# Patient Record
Sex: Female | Born: 1965
Health system: Southern US, Community
[De-identification: ages and names within clinical notes are randomized; demographics above are authoritative.]

## PROBLEM LIST (undated history)

## (undated) DIAGNOSIS — Z9889 Other specified postprocedural states: Secondary | ICD-10-CM

## (undated) DIAGNOSIS — E669 Obesity, unspecified: Secondary | ICD-10-CM

## (undated) DIAGNOSIS — E039 Hypothyroidism, unspecified: Secondary | ICD-10-CM

## (undated) DIAGNOSIS — K529 Noninfective gastroenteritis and colitis, unspecified: Secondary | ICD-10-CM

## (undated) DIAGNOSIS — G629 Polyneuropathy, unspecified: Secondary | ICD-10-CM

## (undated) DIAGNOSIS — E785 Hyperlipidemia, unspecified: Secondary | ICD-10-CM

## (undated) DIAGNOSIS — I5189 Other ill-defined heart diseases: Secondary | ICD-10-CM

## (undated) DIAGNOSIS — Z9109 Other allergy status, other than to drugs and biological substances: Secondary | ICD-10-CM

## (undated) DIAGNOSIS — K5900 Constipation, unspecified: Secondary | ICD-10-CM

## (undated) DIAGNOSIS — D649 Anemia, unspecified: Secondary | ICD-10-CM

## (undated) DIAGNOSIS — J45909 Unspecified asthma, uncomplicated: Secondary | ICD-10-CM

## (undated) DIAGNOSIS — R350 Frequency of micturition: Secondary | ICD-10-CM

## (undated) DIAGNOSIS — K219 Gastro-esophageal reflux disease without esophagitis: Secondary | ICD-10-CM

## (undated) DIAGNOSIS — K649 Unspecified hemorrhoids: Secondary | ICD-10-CM

## (undated) DIAGNOSIS — R3915 Urgency of urination: Secondary | ICD-10-CM

## (undated) DIAGNOSIS — R112 Nausea with vomiting, unspecified: Secondary | ICD-10-CM

## (undated) DIAGNOSIS — R0600 Dyspnea, unspecified: Secondary | ICD-10-CM

## (undated) DIAGNOSIS — H409 Unspecified glaucoma: Secondary | ICD-10-CM

## (undated) DIAGNOSIS — K5792 Diverticulitis of intestine, part unspecified, without perforation or abscess without bleeding: Secondary | ICD-10-CM

## (undated) DIAGNOSIS — M1990 Unspecified osteoarthritis, unspecified site: Secondary | ICD-10-CM

## (undated) DIAGNOSIS — G4733 Obstructive sleep apnea (adult) (pediatric): Secondary | ICD-10-CM

## (undated) HISTORY — PX: COLONOSCOPY: SHX174

## (undated) HISTORY — PX: REDUCTION MAMMAPLASTY: SUR839

## (undated) HISTORY — DX: Diverticulitis of intestine, part unspecified, without perforation or abscess without bleeding: K57.92

## (undated) HISTORY — PX: OTHER SURGICAL HISTORY: SHX169

## (undated) HISTORY — DX: Obesity, unspecified: E66.9

## (undated) HISTORY — DX: Obstructive sleep apnea (adult) (pediatric): G47.33

## (undated) HISTORY — PX: CHOLECYSTECTOMY: SHX55

## (undated) HISTORY — DX: Noninfective gastroenteritis and colitis, unspecified: K52.9

## (undated) HISTORY — DX: Dyspnea, unspecified: R06.00

## (undated) HISTORY — PX: CARPAL TUNNEL RELEASE: SHX101

## (undated) HISTORY — DX: Other ill-defined heart diseases: I51.89

## (undated) HISTORY — DX: Hyperlipidemia, unspecified: E78.5

## (undated) HISTORY — DX: Polyneuropathy, unspecified: G62.9

## (undated) HISTORY — PX: BREAST REDUCTION SURGERY: SHX8

---

## 1990-02-12 HISTORY — PX: TUBAL LIGATION: SHX77

## 2005-02-12 HISTORY — PX: OTHER SURGICAL HISTORY: SHX169

## 2006-02-12 HISTORY — PX: CHOLECYSTECTOMY: SHX55

## 2006-12-07 ENCOUNTER — Emergency Department (HOSPITAL_COMMUNITY): Admission: EM | Admit: 2006-12-07 | Discharge: 2006-12-07 | Payer: Self-pay | Admitting: Emergency Medicine

## 2006-12-10 ENCOUNTER — Ambulatory Visit: Payer: Self-pay | Admitting: Internal Medicine

## 2006-12-10 LAB — CONVERTED CEMR LAB
BUN: 11 mg/dL (ref 6–23)
CO2: 24 meq/L (ref 19–32)
Cholesterol: 203 mg/dL — ABNORMAL HIGH (ref 0–200)
Creatinine, Ser: 0.82 mg/dL (ref 0.40–1.20)
Eosinophils Relative: 2 % (ref 0–5)
Glucose, Bld: 89 mg/dL (ref 70–99)
HCT: 36.6 % (ref 36.0–46.0)
Hemoglobin: 11.8 g/dL — ABNORMAL LOW (ref 12.0–15.0)
Lymphocytes Relative: 34 % (ref 12–46)
Lymphs Abs: 2.4 10*3/uL (ref 0.7–3.3)
Monocytes Absolute: 0.4 10*3/uL (ref 0.2–0.7)
RDW: 14.7 % — ABNORMAL HIGH (ref 11.5–14.0)
Total Bilirubin: 0.3 mg/dL (ref 0.3–1.2)
Total CHOL/HDL Ratio: 3.5
Triglycerides: 144 mg/dL (ref <150)
VLDL: 29 mg/dL (ref 0–40)

## 2007-01-01 ENCOUNTER — Ambulatory Visit (HOSPITAL_COMMUNITY): Admission: RE | Admit: 2007-01-01 | Discharge: 2007-01-01 | Payer: Self-pay | Admitting: Family Medicine

## 2007-01-27 ENCOUNTER — Ambulatory Visit: Payer: Self-pay | Admitting: *Deleted

## 2007-01-27 ENCOUNTER — Encounter (INDEPENDENT_AMBULATORY_CARE_PROVIDER_SITE_OTHER): Payer: Self-pay | Admitting: Internal Medicine

## 2007-01-27 ENCOUNTER — Encounter: Payer: Self-pay | Admitting: Family Medicine

## 2007-01-27 ENCOUNTER — Ambulatory Visit: Payer: Self-pay | Admitting: Family Medicine

## 2007-03-26 ENCOUNTER — Emergency Department (HOSPITAL_COMMUNITY): Admission: EM | Admit: 2007-03-26 | Discharge: 2007-03-26 | Payer: Self-pay | Admitting: Emergency Medicine

## 2007-03-31 ENCOUNTER — Ambulatory Visit: Payer: Self-pay | Admitting: Family Medicine

## 2007-04-09 ENCOUNTER — Ambulatory Visit: Payer: Self-pay | Admitting: Internal Medicine

## 2007-08-25 ENCOUNTER — Ambulatory Visit: Payer: Self-pay | Admitting: Internal Medicine

## 2007-08-27 ENCOUNTER — Ambulatory Visit: Payer: Self-pay | Admitting: Internal Medicine

## 2008-02-10 ENCOUNTER — Emergency Department (HOSPITAL_COMMUNITY): Admission: EM | Admit: 2008-02-10 | Discharge: 2008-02-10 | Payer: Self-pay | Admitting: Emergency Medicine

## 2008-04-05 ENCOUNTER — Emergency Department (HOSPITAL_COMMUNITY): Admission: EM | Admit: 2008-04-05 | Discharge: 2008-04-05 | Payer: Self-pay | Admitting: Internal Medicine

## 2008-04-09 ENCOUNTER — Ambulatory Visit: Payer: Self-pay | Admitting: Internal Medicine

## 2008-04-19 ENCOUNTER — Encounter (INDEPENDENT_AMBULATORY_CARE_PROVIDER_SITE_OTHER): Payer: Self-pay | Admitting: Adult Health

## 2008-04-19 ENCOUNTER — Ambulatory Visit: Payer: Self-pay | Admitting: Internal Medicine

## 2008-04-19 ENCOUNTER — Ambulatory Visit (HOSPITAL_COMMUNITY): Admission: RE | Admit: 2008-04-19 | Discharge: 2008-04-19 | Payer: Self-pay | Admitting: Internal Medicine

## 2008-04-19 LAB — CONVERTED CEMR LAB
Albumin: 3.8 g/dL (ref 3.5–5.2)
Alkaline Phosphatase: 56 units/L (ref 39–117)
BUN: 16 mg/dL (ref 6–23)
Basophils Absolute: 0 10*3/uL (ref 0.0–0.1)
Basophils Relative: 0 % (ref 0–1)
CO2: 21 meq/L (ref 19–32)
Cholesterol: 177 mg/dL (ref 0–200)
Eosinophils Relative: 4 % (ref 0–5)
Glucose, Bld: 91 mg/dL (ref 70–99)
HCT: 35.2 % — ABNORMAL LOW (ref 36.0–46.0)
HDL: 61 mg/dL (ref >39)
LDL Cholesterol: 96 mg/dL (ref 0–99)
Lymphocytes Relative: 30 % (ref 12–46)
MCV: 64.9 fL — ABNORMAL LOW (ref 78.0–100.0)
Monocytes Absolute: 0.8 10*3/uL (ref 0.1–1.0)
Monocytes Relative: 8 % (ref 3–12)
Platelets: 313 10*3/uL (ref 150–400)
Potassium: 4.1 meq/L (ref 3.5–5.3)
RDW: 16 % — ABNORMAL HIGH (ref 11.5–15.5)
Triglycerides: 102 mg/dL (ref <150)

## 2008-04-20 ENCOUNTER — Encounter (INDEPENDENT_AMBULATORY_CARE_PROVIDER_SITE_OTHER): Payer: Self-pay | Admitting: Adult Health

## 2008-04-29 ENCOUNTER — Ambulatory Visit (HOSPITAL_COMMUNITY): Admission: RE | Admit: 2008-04-29 | Discharge: 2008-04-29 | Payer: Self-pay | Admitting: Family Medicine

## 2008-05-03 ENCOUNTER — Ambulatory Visit: Payer: Self-pay | Admitting: Internal Medicine

## 2008-05-03 ENCOUNTER — Encounter: Payer: Self-pay | Admitting: Internal Medicine

## 2008-05-05 ENCOUNTER — Ambulatory Visit (HOSPITAL_COMMUNITY): Admission: RE | Admit: 2008-05-05 | Discharge: 2008-05-05 | Payer: Self-pay | Admitting: Internal Medicine

## 2008-05-05 ENCOUNTER — Encounter: Payer: Self-pay | Admitting: Internal Medicine

## 2008-05-11 ENCOUNTER — Encounter: Payer: Self-pay | Admitting: Internal Medicine

## 2008-05-11 ENCOUNTER — Encounter (INDEPENDENT_AMBULATORY_CARE_PROVIDER_SITE_OTHER): Payer: Self-pay | Admitting: Adult Health

## 2008-05-11 ENCOUNTER — Ambulatory Visit: Payer: Self-pay | Admitting: Family Medicine

## 2008-05-11 LAB — CONVERTED CEMR LAB
Basophils Relative: 0 % (ref 0–1)
CO2: 22 meq/L (ref 19–32)
Calcium: 9.5 mg/dL (ref 8.4–10.5)
Chloride: 103 meq/L (ref 96–112)
Creatinine, Ser: 1.28 mg/dL — ABNORMAL HIGH (ref 0.40–1.20)
Eosinophils Absolute: 0.2 10*3/uL (ref 0.0–0.7)
Eosinophils Relative: 2 % (ref 0–5)
Glucose, Bld: 107 mg/dL — ABNORMAL HIGH (ref 70–99)
HCT: 37.5 % (ref 36.0–46.0)
Hemoglobin: 11.8 g/dL — ABNORMAL LOW (ref 12.0–15.0)
Lymphs Abs: 3.1 10*3/uL (ref 0.7–4.0)
MCHC: 31.5 g/dL (ref 30.0–36.0)
MCV: 67.7 fL — ABNORMAL LOW (ref 78.0–100.0)
Monocytes Absolute: 0.3 10*3/uL (ref 0.1–1.0)
Monocytes Relative: 4 % (ref 3–12)
Neutrophils Relative %: 59 % (ref 43–77)
RBC: 5.54 M/uL — ABNORMAL HIGH (ref 3.87–5.11)
Total Bilirubin: 0.3 mg/dL (ref 0.3–1.2)

## 2008-05-18 ENCOUNTER — Ambulatory Visit: Payer: Self-pay | Admitting: Internal Medicine

## 2008-06-02 DIAGNOSIS — E785 Hyperlipidemia, unspecified: Secondary | ICD-10-CM

## 2008-06-02 DIAGNOSIS — J4540 Moderate persistent asthma, uncomplicated: Secondary | ICD-10-CM

## 2008-06-03 ENCOUNTER — Ambulatory Visit: Payer: Self-pay | Admitting: Internal Medicine

## 2008-06-03 DIAGNOSIS — R222 Localized swelling, mass and lump, trunk: Secondary | ICD-10-CM

## 2008-06-09 ENCOUNTER — Telehealth: Payer: Self-pay | Admitting: Internal Medicine

## 2008-06-11 ENCOUNTER — Telehealth (INDEPENDENT_AMBULATORY_CARE_PROVIDER_SITE_OTHER): Payer: Self-pay | Admitting: *Deleted

## 2008-06-15 ENCOUNTER — Encounter: Payer: Self-pay | Admitting: Internal Medicine

## 2008-06-15 ENCOUNTER — Encounter: Payer: Self-pay | Admitting: Cardiology

## 2008-06-22 ENCOUNTER — Ambulatory Visit: Payer: Self-pay | Admitting: Cardiology

## 2008-06-25 ENCOUNTER — Encounter: Payer: Self-pay | Admitting: Internal Medicine

## 2008-06-25 ENCOUNTER — Ambulatory Visit (HOSPITAL_COMMUNITY): Admission: RE | Admit: 2008-06-25 | Discharge: 2008-06-25 | Payer: Self-pay | Admitting: Internal Medicine

## 2008-07-01 ENCOUNTER — Ambulatory Visit: Payer: Self-pay | Admitting: Internal Medicine

## 2008-08-24 ENCOUNTER — Ambulatory Visit: Payer: Self-pay | Admitting: Internal Medicine

## 2008-08-24 DIAGNOSIS — K219 Gastro-esophageal reflux disease without esophagitis: Secondary | ICD-10-CM

## 2008-08-29 ENCOUNTER — Ambulatory Visit (HOSPITAL_BASED_OUTPATIENT_CLINIC_OR_DEPARTMENT_OTHER): Admission: RE | Admit: 2008-08-29 | Discharge: 2008-08-29 | Payer: Self-pay | Admitting: Internal Medicine

## 2008-08-29 ENCOUNTER — Ambulatory Visit: Payer: Self-pay | Admitting: Pulmonary Disease

## 2008-10-14 ENCOUNTER — Ambulatory Visit: Payer: Self-pay | Admitting: Pulmonary Disease

## 2008-10-14 DIAGNOSIS — G4733 Obstructive sleep apnea (adult) (pediatric): Secondary | ICD-10-CM | POA: Insufficient documentation

## 2008-10-14 DIAGNOSIS — R609 Edema, unspecified: Secondary | ICD-10-CM

## 2008-10-21 ENCOUNTER — Encounter: Payer: Self-pay | Admitting: Pulmonary Disease

## 2008-10-28 ENCOUNTER — Ambulatory Visit: Payer: Self-pay | Admitting: Internal Medicine

## 2008-10-28 ENCOUNTER — Encounter: Payer: Self-pay | Admitting: Pulmonary Disease

## 2008-11-02 ENCOUNTER — Ambulatory Visit: Payer: Self-pay | Admitting: Family Medicine

## 2008-11-02 ENCOUNTER — Encounter (INDEPENDENT_AMBULATORY_CARE_PROVIDER_SITE_OTHER): Payer: Self-pay | Admitting: Adult Health

## 2008-11-02 LAB — CONVERTED CEMR LAB
ALT: 14 units/L (ref 0–35)
AST: 14 units/L (ref 0–37)
Albumin: 3.8 g/dL (ref 3.5–5.2)
Alkaline Phosphatase: 61 units/L (ref 39–117)
BUN: 10 mg/dL (ref 6–23)
CO2: 25 meq/L (ref 19–32)
Calcium: 8.8 mg/dL (ref 8.4–10.5)
Chlamydia, DNA Probe: NEGATIVE
Chloride: 104 meq/L (ref 96–112)
Creatinine, Ser: 0.91 mg/dL (ref 0.40–1.20)
GC Probe Amp, Genital: NEGATIVE
Glucose, Bld: 104 mg/dL — ABNORMAL HIGH (ref 70–99)
Potassium: 4.3 meq/L (ref 3.5–5.3)
Pro B Natriuretic peptide (BNP): 8.5 pg/mL (ref 0.0–100.0)
Sodium: 139 meq/L (ref 135–145)
TSH: 8.877 microintl units/mL — ABNORMAL HIGH (ref 0.350–4.500)
Total Bilirubin: 0.2 mg/dL — ABNORMAL LOW (ref 0.3–1.2)
Total Protein: 6.6 g/dL (ref 6.0–8.3)
Vit D, 25-Hydroxy: 11 ng/mL — ABNORMAL LOW (ref 30–89)

## 2008-11-03 ENCOUNTER — Encounter: Payer: Self-pay | Admitting: Pulmonary Disease

## 2008-11-30 ENCOUNTER — Ambulatory Visit: Payer: Self-pay | Admitting: Pulmonary Disease

## 2008-12-15 ENCOUNTER — Encounter (HOSPITAL_COMMUNITY): Admission: RE | Admit: 2008-12-15 | Discharge: 2009-02-11 | Payer: Self-pay | Admitting: Internal Medicine

## 2008-12-23 ENCOUNTER — Encounter (INDEPENDENT_AMBULATORY_CARE_PROVIDER_SITE_OTHER): Payer: Self-pay | Admitting: Adult Health

## 2008-12-23 ENCOUNTER — Ambulatory Visit: Payer: Self-pay | Admitting: Internal Medicine

## 2008-12-23 LAB — CONVERTED CEMR LAB
ALT: 16 units/L (ref 0–35)
Albumin: 4.3 g/dL (ref 3.5–5.2)
CO2: 22 meq/L (ref 19–32)
Calcium: 9.2 mg/dL (ref 8.4–10.5)
Chloride: 103 meq/L (ref 96–112)
Glucose, Bld: 76 mg/dL (ref 70–99)
Hemoglobin: 11.4 g/dL — ABNORMAL LOW (ref 12.0–15.0)
Lymphs Abs: 2.7 10*3/uL (ref 0.7–4.0)
MCHC: 31 g/dL (ref 30.0–36.0)
Monocytes Absolute: 0.3 10*3/uL (ref 0.1–1.0)
Monocytes Relative: 4 % (ref 3–12)
Neutro Abs: 4.2 10*3/uL (ref 1.7–7.7)
Neutrophils Relative %: 56 % (ref 43–77)
Potassium: 3.6 meq/L (ref 3.5–5.3)
RBC: 5.49 M/uL — ABNORMAL HIGH (ref 3.87–5.11)
Sodium: 140 meq/L (ref 135–145)
T3 Uptake Ratio: 27.9 % (ref 22.5–37.0)
T4, Total: 7.4 ug/dL (ref 5.0–12.5)
Total Protein: 7.5 g/dL (ref 6.0–8.3)
WBC: 7.5 10*3/uL (ref 4.0–10.5)

## 2008-12-30 ENCOUNTER — Ambulatory Visit (HOSPITAL_COMMUNITY): Admission: RE | Admit: 2008-12-30 | Discharge: 2008-12-30 | Payer: Self-pay | Admitting: Internal Medicine

## 2008-12-30 ENCOUNTER — Encounter: Payer: Self-pay | Admitting: Internal Medicine

## 2009-01-11 ENCOUNTER — Ambulatory Visit: Payer: Self-pay | Admitting: Internal Medicine

## 2009-01-11 ENCOUNTER — Encounter (INDEPENDENT_AMBULATORY_CARE_PROVIDER_SITE_OTHER): Payer: Self-pay | Admitting: Adult Health

## 2009-01-11 LAB — CONVERTED CEMR LAB: TSH: 6.759 microintl units/mL — ABNORMAL HIGH (ref 0.350–4.500)

## 2009-02-12 ENCOUNTER — Encounter (HOSPITAL_COMMUNITY): Admission: RE | Admit: 2009-02-12 | Discharge: 2009-05-13 | Payer: Self-pay | Admitting: Internal Medicine

## 2009-09-22 ENCOUNTER — Ambulatory Visit: Payer: Self-pay | Admitting: Internal Medicine

## 2009-09-22 LAB — CONVERTED CEMR LAB
HDL: 62 mg/dL (ref >39)
Total CHOL/HDL Ratio: 3.4
Uric Acid, Serum: 7.2 mg/dL — ABNORMAL HIGH (ref 2.4–7.0)
VLDL: 31 mg/dL (ref 0–40)

## 2009-10-11 ENCOUNTER — Ambulatory Visit: Payer: Self-pay | Admitting: Internal Medicine

## 2009-10-26 ENCOUNTER — Telehealth: Payer: Self-pay | Admitting: Internal Medicine

## 2009-11-08 ENCOUNTER — Ambulatory Visit: Payer: Self-pay | Admitting: Internal Medicine

## 2009-11-08 ENCOUNTER — Encounter: Payer: Self-pay | Admitting: Internal Medicine

## 2009-11-24 ENCOUNTER — Ambulatory Visit: Payer: Self-pay | Admitting: Pulmonary Disease

## 2009-11-25 ENCOUNTER — Encounter: Payer: Self-pay | Admitting: Pulmonary Disease

## 2009-12-27 ENCOUNTER — Ambulatory Visit (HOSPITAL_COMMUNITY): Admission: RE | Admit: 2009-12-27 | Discharge: 2009-12-27 | Payer: Self-pay | Admitting: Chiropractor

## 2010-03-04 ENCOUNTER — Emergency Department (HOSPITAL_COMMUNITY)
Admission: EM | Admit: 2010-03-04 | Discharge: 2010-03-04 | Payer: Self-pay | Source: Home / Self Care | Admitting: Emergency Medicine

## 2010-03-05 ENCOUNTER — Encounter: Payer: Self-pay | Admitting: Internal Medicine

## 2010-03-16 NOTE — Assessment & Plan Note (Signed)
Summary: asthma/ mbw   Visit Type:  Follow-up Copy to:  Kalman Shan Primary Provider/Referring Provider:  Dr Laqueta Due PMD @ [facility], Pulmonary - Ramaswamy, Sleep - Sood  CC:  Pt here for follow-up. Pt has been out of symbicort since feb. [name]  History of Present Illness: OV 10/11/2009: morbidly obese asthma patient. NOt seen in 1 year. Supposed to be on symbicort. Ran out due to $ issues and health serve delays. Lot of social issues with family being ill as well. Not taken symicort since feb 2011.Snce then daily wheeze, cough, dyspnea. USing rescue inhaler several times per day. No acute or recent worsening. Denies fever, sputum, chills,edema.   Preventive Screening-Counseling & Management  Alcohol-Tobacco     Smoking Status: quit     Packs/Day: 2.0     Year Started: 1985     Year Quit: 1992     Pack years: 14  Current Medications (verified): 1)  Symbicort 80-4.5 Mcg/act Aero (Budesonide-Formoterol Fumarate) .... 2 Puffs Two Times A Day 2)  Chlorpheniramine Maleate 4 Mg Tabs (Chlorpheniramine Maleate) .... Take 1 Tablet By Mouth Once A Day 3)  Zegerid 40-1100 Mg Caps (Omeprazole-Sodium Bicarbonate) .[name].. 1 By Mouth Daily 4)  Vitamin D 1000 Unit Tabs (Cholecalciferol) .... 5000 International Units Weekly 5)  Calcium 600 Mg Tabs (Calcium) .[name].. 1200 Mg Daily 6)  Ventolin Hfa 108 (90 Base) Mcg/act Aers (Albuterol Sulfate) .[name].. 1-2 Puffs Every 8 Hours As Needed 7)  Pycnogenol 30 Mg Caps (Nutritional Supplements) .... 2 Capsules Twice Daily With Meal 8)  Coenzyme Q10 100 Mg Caps (Coenzyme Q10) .... One Capsule Twice Daily With Food 9)  Miralax  Powd (Polyethylene Glycol 3350) .... Once Daily  Allergies (verified): No Known Drug Allergies  Past History:  Past medical, surgical, family and social histories (including risk factors) reviewed, and no changes noted (except as noted below).  Past Medical History: Reviewed history from 11/30/2008 and no changes required. #Dyspnea  due to Obesity and Asthma -> 05/05/2008: Spirometry suggests restriction. CXR 04/19/2008  Normal. CT 02/10/2008  - Neg for PE. No infiltrates -> Methacholine Challenge Test: 06/25/2008: Positive PC20 between 1-4 #Gastroenteritis #Hyperlipidemia  >diet controlled #Diverticulitis #4cm atrial mass - seen on CT 02/10/2008 done in ER to rule out pulmonary emboli when presented for dyspnea -seen by Dr. Antoine Poche on 05/2008 and cleared #OSA on CPAP 12 cm H2O  Past Surgical History: Reviewed history from 06/22/2008 and no changes required. Gallbladder removal-2008 Polyps removal- 2007 Tubal Ligation- 1992  Family History: Reviewed history from 06/22/2008 and no changes required. Mother-asthma as a child, heart murmur, rheumatoid arthritis Brothers- asthma as a child  Social History: Reviewed history from 06/22/2008 and no changes required. 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  The patient denies shortness of breath at rest, productive cough, coughing up blood, chest pain, irregular heartbeats, acid heartburn, indigestion, loss of appetite, weight change, abdominal pain, difficulty swallowing, sore throat, tooth/dental problems, headaches, nasal congestion/difficulty breathing through nose, sneezing, itching, ear ache, anxiety, depression, hand/feet swelling, joint stiffness or pain, rash, change in color of mucus, and fever.         wheezing  Vital Signs:  Patient profile:   45 year old female Height:  68 inches Weight:      364.13 pounds BMI:     55.57 O2 Sat:      98 % on Room air Temp:     98.0 degrees F oral Pulse rate:   90 / minute BP sitting:   120 / 72  (right arm) Cuff size:   large  Vitals Entered By: Carron Curie CMA (October 11, 2009 4:31  PM)  O2 Flow:  Room air CC: Pt here for follow-up. Pt has been out of symbicort since feb.    Physical Exam  General:  obese.  hoarse voice after doing spirometry Head:  normocephalic and atraumatic Eyes:  PERRLA and EOMI.   Ears:  TMs intact and clear with normal canals Nose:  no deformity, discharge, inflammation, or lesions Mouth:  no deformity or lesions Neck:  no masses, thyromegaly, or abnormal cervical nodes Chest Wall:  no deformities noted Lungs:  decreased BS bilateral and prolonged exhilation.   some difficulty finishing sentences once she is walking  Heart:  regular rate and rhythm, S1, S2 without murmurs, rubs, gallops, or clicks Abdomen:  bowel sounds positive; abdomen soft and non-tender without masses, or organomegaly Msk:  no deformity or scoliosis noted with normal posture Pulses:  pulses normal Extremities:  1+ edema Neurologic:  CN II-XII grossly intact with normal reflexes, coordination, muscle strength and tone Skin:  intact without lesions or rashes Cervical Nodes:  no significant adenopathy Axillary Nodes:  no significant adenopathy Psych:  alert and cooperative; normal mood and affect; normal attention span and concentration   Pulmonary Function Test Date: 10/11/2009 4:43 PM Gender: Female  Pre-Spirometry FVC    Value: 2.48 L/min   % Pred: 71.40 % FEV1    Value: 2.01 L     Pred: 2.84 L     % Pred: 70.80 % FEV1/FVC  Value: 80.95 %     % Pred: 97.40 %  Evaluation: moderate obstruction with significant bronchodilator response  Impression & Recommendations:  Problem # 1:  ASTHMA (ICD-493.90) Assessment Deteriorated Deterioration due to non compliance due to financial issues. Fev1 70% today. I will start pulmicort and see her 4 weeks with spirometry. IF she gets worse, she will call or go to ER  Medications Added to Medication List This Visit: 1)  Ventolin Hfa 108 (90 Base) Mcg/act Aers (Albuterol sulfate) .[name].. 1-2 puffs every 8 hours as  needed 2)  Pycnogenol 30 Mg Caps (Nutritional supplements) .... 2 capsules twice daily with meal 3)  Coenzyme Q10 100 Mg Caps (Coenzyme q10) .... One capsule twice daily with food 4)  Miralax Powd (Polyethylene glycol 3350) .... Once daily  Other Orders: Est. Patient Level II ([address])  Patient Instructions: 1)  sorry your family is ill and there are financial dificulties 2)  take pro-air as needed 3)  take pulmicort 2 puff two times a day  4)  take 3 samples and learn technique 5)  return in 4 weeks to report progress 6)  get flu shot asap 7)  if worse call us or go to er     CardioPerfect Spirometry  ID: [id number] Patient: DASHLEY, MONTS DOB: Jan 02, 1966 Age: 45 Years Old Sex: Female Race: Black Physician: ramaswamy Height: 68 Weight: 364.13 PPD: 2.0 Status: Unconfirmed Past Medical History:  #Dyspnea due to Obesity and Asthma -> 05/05/2008: Spirometry suggests restriction. CXR 04/19/2008  Normal. CT 02/10/2008  - Neg for PE. No infiltrates -> Methacholine Challenge Test: 06/25/2008: Positive PC20 between 1-4 #Gastroenteritis #Hyperlipidemia  >diet controlled #  Diverticulitis #4cm atrial mass - seen on CT 02/10/2008 done in ER to rule out pulmonary emboli when presented for dyspnea -seen by Dr. Antoine Poche on 05/2008 and cleared #OSA on CPAP 12 cm H2O  Recorded: 10/11/2009 4:43 PM  Parameter  Measured Predicted %Predicted FVC     2.48        3.48        71.40 FEV1     2.01        2.84        70.80 FEV1%   80.95        83.09        97.40 PEF    5.09        7.16        71   Interpretation:

## 2010-03-16 NOTE — Assessment & Plan Note (Signed)
Summary: rov 4 wks ///kp   Visit Type:  Follow-up Copy to:  Kalman Shan Primary Provider/Referring Provider:  Dr Laqueta Due PMD @ [facility], Pulmonary - Rainen Vanrossum, Sleep - Sood  CC:  4 wek follow-up. [name]  History of Present Illness: Morbidly obese asthma patient.   November 08, 2009: Last seen 10/11/2009 after a year's abscence. AT that time she was in AE-asthma with fev1 70% due tomedication noncompliance secondary to social stress and $ issues. Started her on sample pulmicort. Now returns for followup. Meds reviewed. She is taking pulmicort regularly (unable to afford it). Feels better. Back to baseline. Albuterol use is only 1 since last visit. No new complaints.  No acute or recent worsening. Denies fever, sputum, chills,edema. Today she is c/o sleep issues - CPAP not working well  Preventive Screening-Counseling & Management  Alcohol-Tobacco     Smoking Status: quit     Packs/Day: 2.0     Year Started: 1985     Year Quit: 1992     Pack years: 14  Current Medications (verified): 1)  Pulmicort Flexhaler 180 Mcg/act Aepb (Budesonide) .... 2 Puffs Twice Daily 2)  Chlorpheniramine Maleate 4 Mg Tabs (Chlorpheniramine Maleate) .... Take 1 Tablet By Mouth Once A Day 3)  Zegerid 40-1100 Mg Caps (Omeprazole-Sodium Bicarbonate) .[name].. 1 By Mouth Daily 4)  Vitamin D 1000 Unit Tabs (Cholecalciferol) .... 5000 International Units Weekly 5)  Calcium 600 Mg Tabs (Calcium) .[name].. 1200 Mg Daily 6)  Ventolin Hfa 108 (90 Base) Mcg/act Aers (Albuterol Sulfate) .[name].. 1-2 Puffs Every 8 Hours As Needed 7)  Pycnogenol 30 Mg Caps (Nutritional Supplements) .... 2 Capsules Twice Daily With Meal 8)  Coenzyme Q10 100 Mg Caps (Coenzyme Q10) .... One Capsule Twice Daily With Food 9)  Miralax  Powd (Polyethylene Glycol 3350) .... Once Daily 10)  Aleve 220 Mg Tabs (Naproxen Sodium) .... As Needed 11)  Multivitamins  Tabs (Multiple Vitamin) .... Take 1 Tablet By Mouth Once A Day  Allergies (verified): No  Known Drug Allergies  Past History:  Past medical, surgical, family and social histories (including risk factors) reviewed, and no changes noted (except as noted below).  Past Medical History: #Dyspnea due to Obesity and Asthma -> 05/05/2008: Spirometry suggests restriction. CXR 04/19/2008  Normal. CT 02/10/2008  - Neg for PE. No infiltrates -> Methacholine Challenge Test: 06/25/2008: Positive PC20 between 1-4  - 10/11/2009: fev1 2L/70% -> start pulmicort  -11/08/2009: fev1 2.24L/78% and better - > switch to QVAR due to cost #Gastroenteritis #Hyperlipidemia  >diet controlled #Diverticulitis #4cm atrial mass - seen on CT 02/10/2008 done in ER to rule out pulmonary emboli when presented for dyspnea -seen by Dr. Antoine Poche on 05/2008 and cleared #OSA on CPAP 12 cm H2O  Past Surgical History: Reviewed history from 06/22/2008 and no changes required. Gallbladder removal-2008 Polyps removal- 2007 Tubal Ligation- 1992  Family History: Reviewed history from 06/22/2008 and no changes required. Mother-asthma as a child, heart murmur, rheumatoid arthritis Brothers- asthma as a child  Social History: Reviewed history from 06/22/2008 and no changes required. 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    Vital Signs:  Patient profile:   45 year old female Height:      68 inches Weight:      367.50 pounds BMI:      56.08 O2 Sat:      100 % on Room air Temp:     97.9 degrees F oral Pulse rate:   84 / minute BP sitting:   122 / 70  (left arm) Cuff size:   large  Vitals Entered By: Carron Curie CMA (November 08, 2009 9:10 AM)  O2 Flow:  Room air CC: 4 wek follow-up.  Comments Medications reviewed with patient Carron Curie CMA  November 08, 2009 9:13 AM Daytime phone number verified with patient.    Physical Exam  General:  obese. normal appearance and healthy appearing.   Head:  normocephalic and atraumatic Eyes:  PERRLA and EOMI.   Ears:  TMs intact and clear with normal canals Nose:  no deformity, discharge, inflammation, or lesions Mouth:  no deformity or lesions Neck:  no masses, thyromegaly, or abnormal cervical nodes Chest Wall:  no deformities noted Lungs:  clear bilaterally to auscultation and percussion Heart:  regular rate and rhythm, S1, S2 without murmurs, rubs, gallops, or clicks Abdomen:  bowel sounds positive; abdomen soft and non-tender without masses, or organomegaly Msk:  no deformity or scoliosis noted with normal posture Pulses:  pulses normal Extremities:  1+ edema Neurologic:  CN II-XII grossly intact with normal reflexes, coordination, muscle strength and tone Skin:  intact without lesions or rashes Cervical Nodes:  no significant adenopathy Axillary Nodes:  no significant adenopathy Psych:  alert and cooperative; normal mood and affect; normal attention span and concentration   Pre-Spirometry FEV1    Value: 2.24L L     % Pred: 78% %  Comments: improved with pulmicort independently reviewd  Evaluation: near normal - significant bronchodilator response  Impression & Recommendations:  Problem # 1:  OBSTRUCTIVE SLEEP APNEA (ICD-327.23) Assessment Unchanged  cpap not helping  plan re-refer to Dr. Craige Cotta  Orders: Est. Patient Level III ([address])  Problem # 2:  ASTHMA (ICD-493.90) Assessment: Improved Improved subjctively to very well  controlled category. Objectively, spiro shows 240cc improvement to fev1 2.24L/78%.   PLAN switch pulmicort to QVAR due to cost HFA instructions given flu shot today rov 6 months or sooner if there are problems  Medications Added to Medication List This Visit: 1)  Qvar 80 Mcg/act Aers (Beclomethasone dipropionate) .... Two  puffs twice daily 2)  Aleve 220 Mg Tabs (Naproxen sodium) .... As needed 3)  Multivitamins Tabs (Multiple vitamin) .... Take 1 tablet by mouth once a day 4)  Qvar 80 Mcg/act Aers (Beclomethasone dipropionate) .... Two  puffs twice daily  Other Orders: Prescription Created Electronically [phone]) HFA Instruction [phone])  Patient Instructions: 1)  #ASTHMA  2)  glad your asthma is better 3)  stop pulmicort due to cost 4)  start qvar 2 puff two times a day without failu 5)  use pro-air as needed 6)  Prescription sent to both [facility] and [facility] - get it from    wherever cheaper 7)  show your technique to my nurse and take samples 8)  return in 6 months or sooner if there are problems 9)  have flu shot today 10)  #SLEEP APNEA 11)  see Dr. Craige Cotta for poor control of sleep apnea Prescriptions: VENTOLIN HFA 108 (90 BASE) MCG/ACT AERS (ALBUTEROL SULFATE) 1-2  puffs every 8 hours as needed  #1 x 3   Entered and Authorized by:   Kalman Shan MD   Signed by:   Kalman Shan MD on 11/08/2009   Method used:   Faxed to ...       [facility] (retail)       [address] [place], [place]  [address]       Ph: [phone]       Fax: [phone]   RxID:   [id number] QVAR 80 MCG/ACT  AERS (BECLOMETHASONE DIPROPIONATE) Two  puffs twice daily  #1 x 12   Entered and Authorized by:   Kalman Shan MD   Signed by:   Kalman Shan MD on 11/08/2009   Method used:   Faxed to ...       [facility] (retail)       [address] [place], [place]  [address]       Ph:  [phone]       Fax: [phone]   RxID:   [id number] QVAR 80 MCG/ACT  AERS (BECLOMETHASONE DIPROPIONATE) Two  puffs twice daily  #1 x 6   Entered and Authorized by:   Kalman Shan MD   Signed by:   Kalman Shan MD on 11/08/2009   Method used:   Electronically to        [facility] [address] (retail)       [phone]       [place], [place]  [address]       Ph: [phone]       Fax: [phone]   RxID:   [id number]   Appended Document: flu shot documentation    Clinical Lists Changes  Orders: Added new Service order of Admin 1st Vaccine ([address]) - Signed Added new Service order of Flu Vaccine 61yrs + [phone]) - Signed Observations: Added new observation of FLU VAX VIS: 09/06/09 version (11/08/2009 9:46) Added new observation of FLU VAXLOT: [id number] (11/08/2009 9:46) Added new observation of FLU VAXMFR: Glaxosmithkline (11/08/2009 9:46) Added new observation of FLU VAX EXP: 08/12/2010 (11/08/2009 9:46) Added new observation of FLU VAX DSE: 0.78ml (11/08/2009 9:46) Added new observation of FLU VAX: Fluvax 3+ (11/08/2009 9:46)    Flu Vaccine Consent Questions     Do you have a history of severe allergic reactions to this vaccine? no    Any prior history of allergic reactions to egg and/or gelatin? no    Do you have a sensitivity to the preservative Thimersol? no    Do you have a past history of Guillan-Barre Syndrome? no    Do you currently have an acute febrile illness? no    Have you ever had a severe reaction to latex? no    Vaccine information given and explained to patient? yes    Are you currently pregnant? no    Lot Number:[id number]   Exp Date:08/12/2010   Site Given  Left Deltoid IM.lbflu  Elray Buba RN  November 08, 2009 9:53 AM

## 2010-03-16 NOTE — Miscellaneous (Signed)
Summary: Unable to reach Pt/[facility]  Unable to reach Pt/[facility]   Imported By: Sherian Rein 12/14/2009 12:22:14  _____________________________________________________________________  External Attachment:    Type:   Image     Comment:   External Document

## 2010-03-16 NOTE — Progress Notes (Signed)
Summary: check on pt  Phone Note Outgoing Call   Call placed by: Carron Curie CMA,  October 26, 2009 4:46 PM Call placed to: Patient Summary of Call: MR requested I call the pt and check to see how she is feeling. I [id number]. Carron Curie CMA  October 26, 2009 4:46 PM  Initial call taken by: Carron Curie CMA,  October 26, 2009 4:46 PM  Follow-up for Phone Call        Pt returning call from yesterday.  [phone] Follow-up by: Eugene Gavia,  October 27, 2009 2:16 PM  Additional Follow-up for Phone Call Additional follow up Details #1::        Pt states she feels better, still coughing some but it has improved. She states she does feel like there is some phlegm in her throat when she coughs but she cannot produce any phlegm. I advised I will forward message to MR to let him know how she is feeling. Carron Curie CMA  October 28, 2009 9:12 AM     Additional Follow-up for Phone Call Additional follow up Details #2::    glad there is improvement. IF she gets worse she can go to ER. but cpz she is better keep appt for 3-4 weeks from prior ov and will do spirmetry at fu Follow-up by: Kalman Shan MD,  October 28, 2009 11:43 AM  Additional Follow-up for Phone Call Additional follow up Details #3:: Details for Additional Follow-up Action Taken: Spoke with pt and notified of recs per MR.  Pt verbalized understanding and states that she will keep ov for 11/08/09 at 9 am. Additional Follow-up by: Vernie Murders,  October 28, 2009 1:34 PM

## 2010-03-16 NOTE — Assessment & Plan Note (Signed)
Summary: f/u sleep per MR ///kp   Copy to:  Kalman Shan Primary Provider/Referring Provider:  Dr Laqueta Due PMD @ [facility], Pulmonary - [facility], Sleep - [place]  CC:  Pt here for fleep follow-up. pt c/o air leak from mask. Pt c/o waking up choking and falls asleep during the day.Melissa Rowe  History of Present Illness: 45 yo female with OSA on CPAP 12 cm  She has been getting more sleepy during the day.  She is having trouble sleeping through the night.  She wakes up every couple hours feeling dry in her throat and gets a gasping/coughing sensation.  She has been staying in bed for 7 to 9 hours, but this has not helped.  She is not using anything to help sleep or stay awake.  She has not had contact with her DME in almost 2 years.  She is using her same CPAP mask that she got when she had her original set up.  She says that mask leaks a lot, and she has tried to tighten the mask.  This has caused her to get a sore on the back of her neck.  She does not feel like she is getting the same pressure like she did before.   Current Medications (verified): 1)  Qvar 80 Mcg/act  Aers (Beclomethasone Dipropionate) .... Two  Puffs Twice Daily 2)  Chlorpheniramine Maleate 4 Mg Tabs (Chlorpheniramine Maleate) .... Take 1 Tablet By Mouth Once A Day 3)  Zegerid 40-1100 Mg Caps (Omeprazole-Sodium Bicarbonate) .Melissa Rowe.. 1 By Mouth Daily 4)  Vitamin D 1000 Unit Tabs (Cholecalciferol) .... 5000 International Units Weekly 5)  Calcium 600 Mg Tabs (Calcium) .Melissa Rowe.. 1200 Mg Daily 6)  Ventolin Hfa 108 (90 Base) Mcg/act Aers (Albuterol Sulfate) .Melissa Rowe.. 1-2 Puffs Every 8 Hours As Needed 7)  Pycnogenol 30 Mg Caps (Nutritional Supplements) .... 2 Capsules Twice Daily With Meal 8)  Coenzyme Q10 100 Mg Caps (Coenzyme Q10) .... One Capsule Twice Daily With Food 9)  Miralax  Powd (Polyethylene Glycol 3350) .... Once Daily 10)  Aleve 220 Mg Tabs (Naproxen Sodium) .... As Needed 11)  Multivitamins  Tabs (Multiple Vitamin) .... Take 1  Tablet By Mouth Once A Day 12)  Cpap .Melissa Rowe.. 12  Allergies (verified): No Known Drug Allergies  Past History:  Past Medical History: Reviewed history from 11/08/2009 and no changes required. #Dyspnea due to Obesity and Asthma -> 05/05/2008: Spirometry suggests restriction. CXR 04/19/2008  Normal. CT 02/10/2008  - Neg for PE. No infiltrates -> Methacholine Challenge Test: 06/25/2008: Positive PC20 between 1-4  - 10/11/2009: fev1 2L/70% -> start pulmicort  -11/08/2009: fev1 2.24L/78% and better - > switch to QVAR due to cost #Gastroenteritis #Hyperlipidemia  >diet controlled #Diverticulitis #4cm atrial mass - seen on CT 02/10/2008 done in ER to rule out pulmonary emboli when presented for dyspnea -seen by Dr. Antoine Poche on 05/2008 and cleared #OSA on CPAP 12 cm H2O  Past Surgical History: Reviewed history from 06/22/2008 and no changes required. Gallbladder removal-2008 Polyps removal- 2007 Tubal Ligation- 1992  Vital Signs:  Patient profile:   45 year old female Height:      68 inches Weight:      370.50 pounds O2 Sat:      99 % on Room air Temp:     98.4 degrees F oral Pulse rate:   103 / minute BP sitting:   114 / 70  (right arm) Cuff size:   large  Vitals Entered By: Carron Curie CMA (November 24, 2009 1:59 PM)  O2 Flow:  Room air CC: Pt here for fleep follow-up. pt c/o air leak from mask. Pt c/o waking up choking and falls asleep during the day. Comments Medications reviewed with patient Carron Curie CMA  November 24, 2009 2:02 PM  Daytime phone number verified with patient.    Physical Exam  General:  obese. normal appearance and healthy appearing.   Nose:  no deformity, discharge, inflammation, or lesions Mouth:  no deformity or lesions Neck:  no masses, thyromegaly, or abnormal cervical nodes Lungs:  clear bilaterally to auscultation and percussion Heart:  regular rate and rhythm, S1, S2 without murmurs, rubs, gallops, or clicks Extremities:  1+  edema Neurologic:  normal CN II-XII and strength normal.   Cervical Nodes:  no significant adenopathy Psych:  alert and cooperative; normal mood and affect; normal attention span and concentration   Impression & Recommendations:  Problem # 1:  OBSTRUCTIVE SLEEP APNEA (ICD-327.23)  I am not sure if her current problems are related to her mask or if she needs change in her pressure.  Will arrange for her to get a new CPAP mask.  Will then get a CPAP download after she has used the device with a new mask for 2 weeks.  Will then decide if she needs to have adjustment in her pressure setting also.  Problem # 2:  MORBID OBESITY (ICD-278.01)  Again discussed the importance or weight loss.  Medications Added to Medication List This Visit: 1)  Cpap  .Melissa Rowe.. 12  Complete Medication List: 1)  Qvar 80 Mcg/act Aers (Beclomethasone dipropionate) .... Two  puffs twice daily 2)  Chlorpheniramine Maleate 4 Mg Tabs (Chlorpheniramine maleate) .... Take 1 tablet by mouth once a day 3)  Zegerid 40-1100 Mg Caps (Omeprazole-sodium bicarbonate) .Melissa Rowe.. 1 by mouth daily 4)  Vitamin D 1000 Unit Tabs (Cholecalciferol) .... 5000 international units weekly 5)  Calcium 600 Mg Tabs (Calcium) .Melissa Rowe.. 1200 mg daily 6)  Ventolin Hfa 108 (90 Base) Mcg/act Aers (Albuterol sulfate) .Melissa Rowe.. 1-2 puffs every 8 hours as needed 7)  Pycnogenol 30 Mg Caps (Nutritional supplements) .... 2 capsules twice daily with meal 8)  Coenzyme Q10 100 Mg Caps (Coenzyme q10) .... One capsule twice daily with food 9)  Miralax Powd (Polyethylene glycol 3350) .... Once daily 10)  Aleve 220 Mg Tabs (Naproxen sodium) .... As needed 11)  Multivitamins Tabs (Multiple vitamin) .... Take 1 tablet by mouth once a day 12)  Cpap  .Melissa Rowe.. 12  Other Orders: DME Referral (DME) Est. Patient Level III (16109)  Patient Instructions: 1)  Will arrange for new CPAP mask 2)  Will get report from CPAP machine 3)  Follow up in 4 to 6 months

## 2010-05-30 LAB — URINE CULTURE

## 2010-05-30 LAB — URINALYSIS, ROUTINE W REFLEX MICROSCOPIC
Glucose, UA: NEGATIVE mg/dL
Ketones, ur: 15 mg/dL — AB
Nitrite: NEGATIVE
Specific Gravity, Urine: 1.016 (ref 1.005–1.030)
pH: 8.5 — ABNORMAL HIGH (ref 5.0–8.0)

## 2010-05-30 LAB — COMPREHENSIVE METABOLIC PANEL
ALT: 17 U/L (ref 0–35)
AST: 21 U/L (ref 0–37)
Albumin: 3.4 g/dL — ABNORMAL LOW (ref 3.5–5.2)
Alkaline Phosphatase: 59 U/L (ref 39–117)
Calcium: 9 mg/dL (ref 8.4–10.5)
GFR calc Af Amer: >60 mL/min (ref >60)
Glucose, Bld: 132 mg/dL — ABNORMAL HIGH (ref 70–99)
Potassium: 3.8 mEq/L (ref 3.5–5.1)
Sodium: 139 mEq/L (ref 135–145)
Total Protein: 6.8 g/dL (ref 6.0–8.3)

## 2010-05-30 LAB — GLUCOSE, CAPILLARY

## 2010-05-30 LAB — URINE MICROSCOPIC-ADD ON

## 2010-05-30 LAB — D-DIMER, QUANTITATIVE: D-Dimer, Quant: 0.44 ug/mL-FEU (ref 0.00–0.48)

## 2010-06-23 ENCOUNTER — Ambulatory Visit: Payer: Self-pay | Admitting: Internal Medicine

## 2010-06-26 ENCOUNTER — Encounter: Payer: Self-pay | Admitting: Internal Medicine

## 2010-06-26 ENCOUNTER — Telehealth: Payer: Self-pay | Admitting: Internal Medicine

## 2010-06-26 ENCOUNTER — Ambulatory Visit (INDEPENDENT_AMBULATORY_CARE_PROVIDER_SITE_OTHER): Payer: Self-pay | Admitting: Internal Medicine

## 2010-06-26 DIAGNOSIS — J45909 Unspecified asthma, uncomplicated: Secondary | ICD-10-CM

## 2010-06-26 MED ORDER — MOMETASONE FURO-FORMOTEROL FUM 100-5 MCG/ACT IN AERO: 2.0000 | INHALATION_SPRAY | Freq: Two times a day (BID) | RESPIRATORY_TRACT | Status: DC

## 2010-06-26 MED ORDER — PREDNISONE (PAK) 10 MG PO TABS: ORAL_TABLET | ORAL | Status: DC

## 2010-06-26 MED ORDER — MOMETASONE FURO-FORMOTEROL FUM 200-5 MCG/ACT IN AERO: 2.0000 | INHALATION_SPRAY | Freq: Two times a day (BID) | RESPIRATORY_TRACT | Status: DC

## 2010-06-26 NOTE — Progress Notes (Signed)
  Subjective:    Patient ID: Melissa Rowe, female    DOB: [birth year], 45 y.o.   MRN: [id number]  HPI Morbidly obese asthma patient. Asthma followed by Dr. Marchelle Gearing. OSA followed by Dr. Craige Cotta   November 08, 2009: Last seen 10/11/2009 after a year's abscence. AT that time she was in AE-asthma with fev1 70% due tomedication noncompliance secondary to social stress and $ issues. Started her on sample pulmicort. Now returns for followup. Meds reviewed. She is taking pulmicort regularly (unable to afford it). Feels better. Back to baseline. Albuterol use is only 1 since last visit. No new complaints.  No acute or recent worsening. Denies fever, sputum, chills,edema. Today she is c/o sleep issues - CPAP not working well. fev1 2.24L/78% and better - > switch to QVAR due to cost  OV 06/26/2010: Not followed for 8 months for asthma. States qvar worked well for a while but past 2-3 months feels control is not adequate despite good compliance. Reports on and off chest tightness that is worse past 6-8 weeks. Albuterol gives relief for and is using it several times daily. Associated wheeze, cough, dyspnea present. Chest tightness is diffuse across chest and associated nocturnal awakneings present. No gerd. No sinus. No chest pain. No radiation. No fever, No sputum   Review of Systems  Constitutional: Negative for fever and unexpected weight change.  HENT: Negative for ear pain, nosebleeds, congestion, sore throat, rhinorrhea, sneezing, trouble swallowing, dental problem, postnasal drip and sinus pressure.   Eyes: Negative for redness and itching.  Respiratory: Positive for cough, chest tightness, shortness of breath and wheezing.   Cardiovascular: Negative for palpitations and leg swelling.  Gastrointestinal: Negative for nausea and vomiting.  Genitourinary: Negative for dysuria.  Musculoskeletal: Negative for joint swelling.  Skin: Negative for rash.  Neurological: Negative for headaches.    Hematological: Does not bruise/bleed easily.  Psychiatric/Behavioral: Negative for dysphoric mood. The patient is not nervous/anxious.      Objective:   Physical Exam General:  obese. normal appearance and healthy appearing.   Head:  normocephalic and atraumatic Eyes:  PERRLA and EOMI.   Ears:  TMs intact and clear with normal canals Nose:  no deformity, discharge, inflammation, or lesions Mouth:  no deformity or lesions Neck:  no masses, thyromegaly, or abnormal cervical nodes Chest Wall:  no deformities noted Lungs:  clear bilaterally to auscultation and percussion Heart:  regular rate and rhythm, S1, S2 without murmurs, rubs, gallops, or clicks Abdomen:  bowel sounds positive; abdomen soft and non-tender without masses, or organomegaly Msk:  no deformity or scoliosis noted with normal posture Pulses:  pulses normal Extremities:  1+ edema Neurologic:  CN II-XII grossly intact with normal reflexes, coordination, muscle strength and tone   Skin:  intact without lesions or rashes Cervical Nodes:  no significant adenopathy Axillary Nodes:  no significant adenopathy Psych:  alert and cooperative; normal mood and affect; normal attention span and concentration     Assessment & Plan:

## 2010-06-26 NOTE — Telephone Encounter (Signed)
MR nurse, Victorino Dike has called rx to [facility]. Pt is aware.

## 2010-06-26 NOTE — Patient Instructions (Addendum)
Do spirometry today STop qvar. INstead take dulera high dose inhaler 2 puff twice daily - take samples and show technique Take prednisone as directed for 6 days REturn in 2 weeks Office room spirometry during return IF symptoms get worse call us or come sooner or go to ER

## 2010-06-27 NOTE — Procedures (Signed)
NAME:  Melissa Rowe, Melissa Rowe NO.:  [id number]   MEDICAL RECORD NO.:  [id number]          PATIENT TYPE:  OUT   LOCATION:  SLEEP CENTER                 FACILITY:  [facility]   PHYSICIAN:  Coralyn Helling, MD        DATE OF BIRTH:  July 23, 1965   DATE OF STUDY:  08/29/2008                            NOCTURNAL POLYSOMNOGRAM   REFERRING PHYSICIAN:  Kalman Shan, MD   INDICATIONS:  Ms. Nazario is a 45 year old female who has a history of  obesity, sleep disruption and excessive daytime sleepiness.  She is  referred to the Sleep Lab for evaluation of hypersomnia with obstructive  sleep apnea.   Height is 5 feet 6 inches, weight is 328 pounds.  BMI is 53.  Neck size  is 17 inches.   MEDICATIONS:  Symbicort.   EPWORTH SCORE:  15.   SLEEP ARCHITECTURE:  Total recording time was 424 minutes.  Total sleep  time was 347 minutes.  Sleep efficiency was 82%.  Sleep latency was 36  minutes.  REM latency was 87 minutes.  The study was notable for lack of  slow wave sleep.  The patient slept exclusively in the nonsupine  position.   RESPIRATORY DATA:  The average respiratory rate was 20.  Loud snoring  was noted by the technician.  The overall apnea-hypopnea index was 3.  The respiratory disturbance index was 4.5.  There was 4 central apneic  events.  The remainder of the events were obstructive in nature.  The  REM apnea-hypopnea index was 11.4.  The non-REM apnea-hypopnea index was  0.9.   OXYGEN DATA:  The baseline oxygenation was 98%.  The oxygen saturation  nadir was 92%.   CARDIAC DATA:  The average heart rate was 93 and the rhythm strip showed  normal sinus rhythm with occasional PVCs and sinus tachycardia.   MOVEMENT/PARASOMNIA:  The periodic limb movement index was 0.7.  The  patient had 1 restroom trip.   IMPRESSION:  This study shows evidence for REM-related sleep apnea.  Of  note is that the patient did not have supine sleep during the study and  this may underestimate  the severity of her sleep apnea.   What I would recommend first is the patient will be counseled with  regards to importance of diet, exercise, and weight reduction.  If this  is unsuccessful and she remains symptomatic, then additional therapeutic  interventions could include CPAP therapy, oral appliance or surgical  intervention.      Coralyn Helling, MD  Diplomat, American Board of Sleep Medicine  Electronically Signed     VS/MEDQ  D:  09/01/2008 08:06:23  T:  09/01/2008 22:19:26  Job:  [id number]   cc:   Kalman Shan, MD  [address] [place] [address]  [place] [address]

## 2010-07-06 NOTE — Assessment & Plan Note (Addendum)
Appears to be poorly controlled on QVAR. Though spirometry is normal and better than before. Will Rx her for mild AE-asthma.   PLAN STop qvar. INstead take dulera high dose inhaler 2 puff twice daily - take samples and show technique Take prednisone as directed for 6 days REturn in 2 weeks Office room spirometry during return IF symptoms get worse call us or come sooner or go to ER

## 2010-07-13 ENCOUNTER — Encounter: Payer: Self-pay | Admitting: Internal Medicine

## 2010-07-13 ENCOUNTER — Ambulatory Visit (INDEPENDENT_AMBULATORY_CARE_PROVIDER_SITE_OTHER): Payer: Self-pay | Admitting: Internal Medicine

## 2010-07-13 VITALS — BP 120/72 | HR 91 | Temp 98.9°F | Ht 67.0 in | Wt 316.2 lb

## 2010-07-13 DIAGNOSIS — J45909 Unspecified asthma, uncomplicated: Secondary | ICD-10-CM

## 2010-07-13 NOTE — Patient Instructions (Signed)
Please continue dulera 2 puff bid - do not miss dose Take enough samples from my nurse Return in 3 months We might contact you about a research study in asthma called [employer] Call or come sooner if you get sick

## 2010-07-13 NOTE — Progress Notes (Signed)
  Subjective:    Patient ID: Melissa Rowe, female    DOB: 1965-12-25, 45 y.o.   MRN: [id number]  HPI November 08, 2009: Last seen 10/11/2009 after a year's abscence. AT that time she was in AE-asthma with fev1 70% due tomedication noncompliance secondary to social stress and $ issues. Started her on sample pulmicort. Now returns for followup. Meds reviewed. She is taking pulmicort regularly (unable to afford it). Feels better. Back to baseline. Albuterol use is only 1 since last visit. No new complaints.  No acute or recent worsening. Denies fever, sputum, chills,edema. Today she is c/o sleep issues - CPAP not working well. fev1 2.24L/78% and better - > switch to QVAR due to cost  OV 06/26/2010: Not followed for 8 months for asthma. States qvar worked well for a while but past 2-3 months feels control is not adequate despite good compliance. Reports on and off chest tightness that is worse past 6-8 weeks. Albuterol gives relief for and is using it several times daily. Associated wheeze, cough, dyspnea present. Chest tightness is diffuse across chest and associated nocturnal awakneings present. No gerd. No sinus. No chest pain. No radiation. No fever, No sputum. OFFICE SPIRO - NORMAL and BETTER than BEFORE. REC: STOP QVAR. STARR DULERA, 6 DAY PRED BURST, RETURN with SPIRO in FEW WEEKS  OV 07/13/2010: Followup after making above changes for asthma. STates 3 days after last visit, felt completely better. Prednisone made her a bit tremulous but helped significantly. Now using dulera 2 puff bid diligently. Feels great. Not used albuterol rescue in over 10 days. Denies wheeze, cough, dyspnea, chest tightness, fver, chills, nausea, vomit, diarrhea, edema, hemoptysis.    Review of Systems  Denies wheeze, cough, dyspnea, chest tightness, fver, chills, nausea, vomit, diarrhea, edema, hemoptysis. Rest per HPI or 11 point ROS negative     Objective:   Physical Exam    General:  obese. normal  appearance and healthy appearing.   Head:  normocephalic and atraumatic Eyes:  PERRLA and EOMI.   Ears:  TMs intact and clear with normal canals Nose:  no deformity, discharge, inflammation, or lesions Mouth:  no deformity or lesions Neck:  no masses, thyromegaly, or abnormal cervical nodes Chest Wall:  no deformities noted Lungs:  clear bilaterally to auscultation and percussion Heart:  regular rate and rhythm, S1, S2 without murmurs, rubs, gallops, or clicks Abdomen:  bowel sounds positive; abdomen soft and non-tender without masses, or organomegaly Msk:  no deformity or scoliosis noted with normal posture Pulses:  pulses normal Extremities:  1+ edema Neurologic:  CN II-XII grossly intact with normal reflexes, coordination, muscle strength and tone   Skin:  intact without lesions or rashes Cervical Nodes:  no significant adenopathy Axillary Nodes:  no significant adenopathy Psych:  alert and cooperative; normal mood and affect; normal attention span and concentration      Assessment & Plan:

## 2010-07-13 NOTE — Assessment & Plan Note (Addendum)
Her spirometry is improved. FEv1 improved by 200cc to 2.5L/94%. ADvised to conitnue dulera 2 puff bid. Return in 3 months

## 2010-09-21 ENCOUNTER — Encounter: Payer: Self-pay | Admitting: Internal Medicine

## 2010-09-21 ENCOUNTER — Ambulatory Visit (INDEPENDENT_AMBULATORY_CARE_PROVIDER_SITE_OTHER): Payer: Self-pay | Admitting: Internal Medicine

## 2010-09-21 VITALS — BP 122/78 | HR 84 | Temp 98.2°F | Ht 67.0 in | Wt 377.2 lb

## 2010-09-21 DIAGNOSIS — J45909 Unspecified asthma, uncomplicated: Secondary | ICD-10-CM

## 2010-09-21 DIAGNOSIS — E559 Vitamin D deficiency, unspecified: Secondary | ICD-10-CM | POA: Insufficient documentation

## 2010-09-21 MED ORDER — VITAMIN D3 1.25 MG (50000 UT) PO CAPS: 1.0000 | ORAL_CAPSULE | ORAL | Status: DC

## 2010-09-21 MED ORDER — MONTELUKAST SODIUM 10 MG PO TABS: 10.0000 mg | ORAL_TABLET | Freq: Every day | ORAL | Status: DC

## 2010-09-21 NOTE — Progress Notes (Signed)
  Subjective:    Patient ID: Melissa Rowe, female    DOB: [birth year], 45 y.o.   MRN: [id number]  HPI  #Asthma -  Baseline was using albuterol one every few days. Past 7-14 dys, multiple albuterol use per day and daily nocturnal awakenings with cough. Worse due to heat. Insidious onset. Albuterol helps. Cough is main symptom. Is dry. Moderate in severity. Course is unchanged since onset. DEnies associated wheeze, fever, chills, cough, edema, sputum, post nasal drainage  Review of past hx: recently dx with Vit D def (level "16") but states PMD has not called in replacement. Wants me to help Review of social: sister in law ward of state Melissa Rowe expired in 3100 ICU due to massive stroke; she is upset Reivw of fam hx: no change   Review of Systems  Constitutional: Negative for fever and unexpected weight change.  HENT: Negative for ear pain, nosebleeds, congestion, sore throat, rhinorrhea, sneezing, trouble swallowing, dental problem, postnasal drip and sinus pressure.   Eyes: Negative for redness and itching.  Respiratory: Negative for cough, chest tightness, shortness of breath and wheezing.   Cardiovascular: Positive for leg swelling. Negative for palpitations.  Gastrointestinal: Negative for nausea and vomiting.  Genitourinary: Negative for dysuria.  Musculoskeletal: Negative for joint swelling.  Skin: Negative for rash.  Neurological: Negative for headaches.  Hematological: Does not bruise/bleed easily.  Psychiatric/Behavioral: Negative for dysphoric mood. The patient is not nervous/anxious.        Objective:   Physical Exam  Vitals reviewed. Constitutional: She is oriented to person, place, and time. She appears well-developed and well-nourished. No distress.       obese  HENT:  Head: Normocephalic and atraumatic.  Right Ear: External ear normal.  Left Ear: External ear normal.  Mouth/Throat: Oropharynx is clear and moist. No oropharyngeal exudate.  Eyes:  Conjunctivae and EOM are normal. Pupils are equal, round, and reactive to light. Right eye exhibits no discharge. Left eye exhibits no discharge. No scleral icterus.  Neck: Normal range of motion. Neck supple. No JVD present. No tracheal deviation present. No thyromegaly present.  Cardiovascular: Normal rate, regular rhythm, normal heart sounds and intact distal pulses.  Exam reveals no gallop and no friction rub.   No murmur heard. Pulmonary/Chest: Effort normal and breath sounds normal. No respiratory distress. She has no wheezes. She has no rales. She exhibits no tenderness.  Abdominal: Soft. Bowel sounds are normal. She exhibits no distension and no mass. There is no tenderness. There is no rebound and no guarding.  Musculoskeletal: Normal range of motion. She exhibits no edema and no tenderness.  Lymphadenopathy:    She has no cervical adenopathy.  Neurological: She is alert and oriented to person, place, and time. She has normal reflexes. No cranial nerve deficit. She exhibits normal muscle tone. Coordination normal.  Skin: Skin is warm and dry. No rash noted. She is not diaphoretic. No erythema. No pallor.  Psychiatric: She has a normal mood and affect. Her behavior is normal. Judgment and thought content normal.          Assessment & Plan:

## 2010-09-21 NOTE — Assessment & Plan Note (Signed)
New dx of vit d def per hx. Level very low per hx. Only taking 400 IU of vit d daily. Per patient healthserve not calling in her vit d yet  PLAN for it D Def - start vit d3 50,000 units once per week x  3 months, then one per month first of each month forever - follow with primary care doc for this

## 2010-09-21 NOTE — Assessment & Plan Note (Signed)
Asthma appears active secondary to heat.  Coughing. Albtuerol use is daily. No wheeze on exam. Reportdly compliant. Wil continue her on dulera 2 puff bid; inhaler technique reinforced. Will add singulair. ROV in few weeks to see improvement. Spirometry at followup

## 2010-09-21 NOTE — Patient Instructions (Addendum)
#  Asthma  - worse probably due to heat and recent death of family member - stay cool  - continue dulera 2 puff twice daily - take sample and show technique - start singulair 10mg  once nightly - if you get worse, call us or go to ER #Vit D Def - start vit d3 50,000 units once per week x  3 months, then one per month first of each month forever - follow with primary care doc for this #Followup - 3 weeks with spirometry to see my NP to go over med calendar and progress of asthma

## 2010-10-12 ENCOUNTER — Encounter: Payer: Self-pay | Admitting: Adult Health

## 2010-10-19 ENCOUNTER — Encounter: Payer: Self-pay | Admitting: Adult Health

## 2010-10-26 ENCOUNTER — Ambulatory Visit (INDEPENDENT_AMBULATORY_CARE_PROVIDER_SITE_OTHER): Payer: Self-pay | Admitting: Adult Health

## 2010-10-26 ENCOUNTER — Encounter: Payer: Self-pay | Admitting: Adult Health

## 2010-10-26 VITALS — BP 118/78 | HR 95 | Temp 97.3°F | Wt 375.0 lb

## 2010-10-26 DIAGNOSIS — J45909 Unspecified asthma, uncomplicated: Secondary | ICD-10-CM

## 2010-10-26 DIAGNOSIS — Z23 Encounter for immunization: Secondary | ICD-10-CM

## 2010-10-26 NOTE — Progress Notes (Signed)
  Subjective:    Patient ID: Melissa Rowe, female    DOB: [birth year], 45 y.o.   MRN: [id number]  HPI  09/21/10 OV  #Asthma -  Baseline was using albuterol one every few days. Past 7-14 dys, multiple albuterol use per day and daily nocturnal awakenings with cough. Worse due to heat. Insidious onset. Albuterol helps. Cough is main symptom. Is dry. Moderate in severity. Course is unchanged since onset. DEnies associated wheeze, fever, chills, cough, edema, sputum, post nasal drainage Review of past hx: recently dx with Vit D def (level "16") but states PMD has not called in replacement. Wants me to help Review of social: sister in law ward of state Melissa Rowe expired in 3100 ICU due to massive stroke; she is upset Reivw of fam hx: no change >>rx vit d 3 , and no change in asthma regimen.   10/26/2010  Presents for follow up and med review. Pt doing good since last ov. Breathing is better w/ less cough or dyspnea.  Today spirometry shows FEV1 at 87% We reviewed all her meds today and organized them into a med calendar. She appears to be taking them correctly.  Today -10/26/2010 FEV1 2.33 L/87%, FVC 2.87 L/88% >cont on dulera     Review of Systems  Constitutional:   No  weight loss, night sweats,  Fevers, chills, fatigue, or  lassitude.  HEENT:   No headaches,  Difficulty swallowing,  Tooth/dental problems, or  Sore throat,                No sneezing, itching, ear ache, nasal congestion, post nasal drip,   CV:  No chest pain,  Orthopnea, PND, swelling in lower extremities, anasarca, dizziness, palpitations, syncope.   GI  No heartburn, indigestion, abdominal pain, nausea, vomiting, diarrhea, change in bowel habits, loss of appetite, bloody stools.   Resp:   No coughing up of blood.     No chest wall deformity  Skin: no rash or lesions.  GU: no dysuria, change in color of urine, no urgency or frequency.  No flank pain, no hematuria   MS:  No joint pain or swelling.  No decreased  range of motion.  No back pain.  Psych:  No change in mood or affect. No depression or anxiety.  No memory loss.        Objective:  PE:  GEN: A/Ox3; pleasant , NAD  HEENT:  [place]/AT,  EACs-clear, TMs-wnl, NOSE-clear, THROAT-clear, no lesions, no postnasal drip or exudate noted.   NECK:  Supple w/ fair ROM; no JVD; normal carotid impulses w/o bruits; no thyromegaly or nodules palpated; no lymphadenopathy.  RESP  Clear  P & A; w/o, wheezes/ rales/ or rhonchi.no accessory muscle use, no dullness to percussion  CARD:  RRR, no m/r/g  , no peripheral edema, pulses intact, no cyanosis or clubbing.  GI:   Soft & nt; nml bowel sounds; no organomegaly or masses detected.  Musco: Warm bil, no deformities or joint swelling noted.   Neuro: alert, no focal deficits noted.    Skin: Warm, no lesions or rashes        Assessment & Plan:

## 2010-10-26 NOTE — Patient Instructions (Signed)
Continue on Dulera 2 puffs Twice daily  -brush/rinse /gargle .  Flu shot today follow up Dr. Marchelle Gearing 2 months and As needed

## 2010-10-27 IMAGING — CR DG CHEST 2V
2 series · 2 of 2 positions shown · non-contrast
Comparison: 02/10/2008

CLINICAL DATA: Cough.  Congestion.

CHEST - 2 VIEW

[w chest pa *]
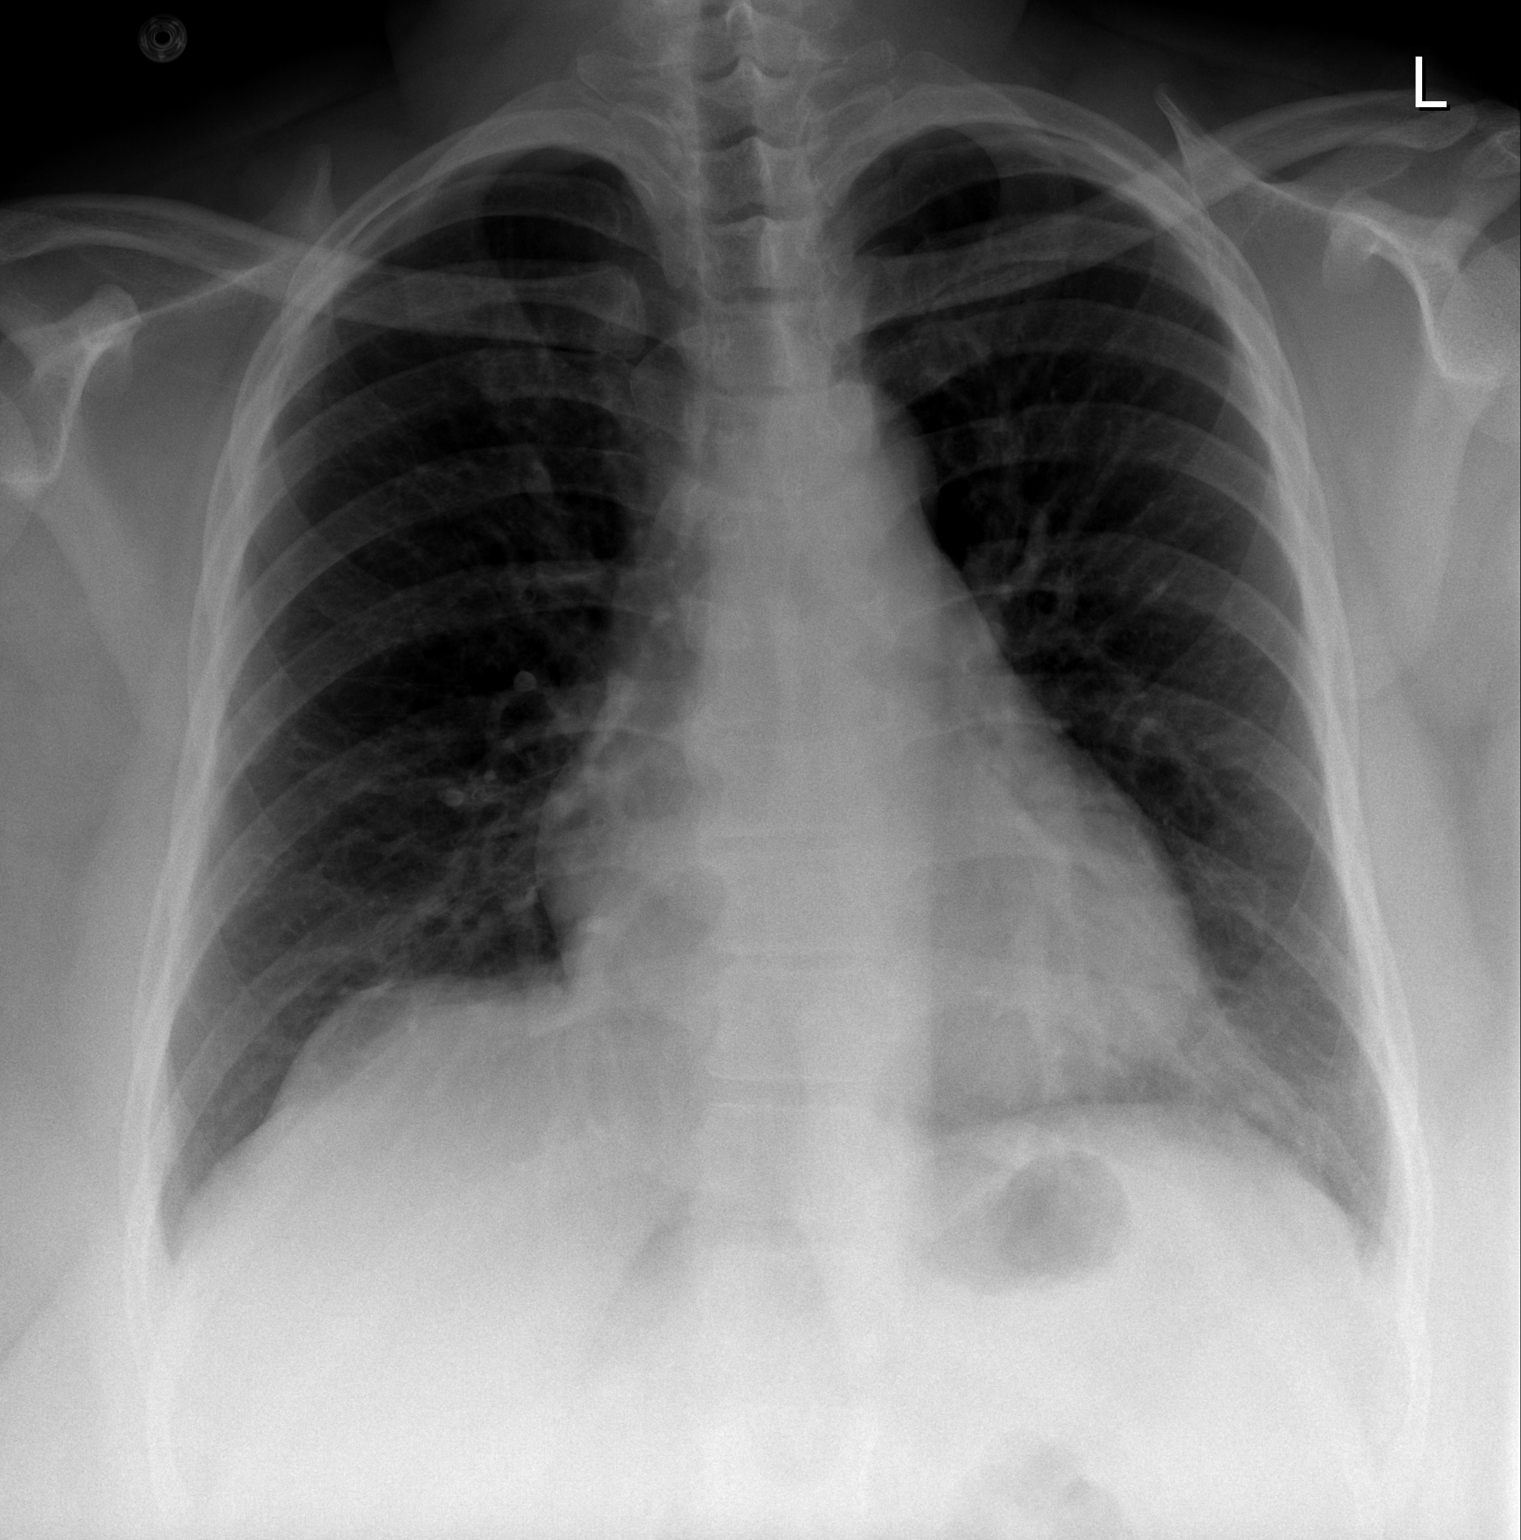

[w chest lat *]
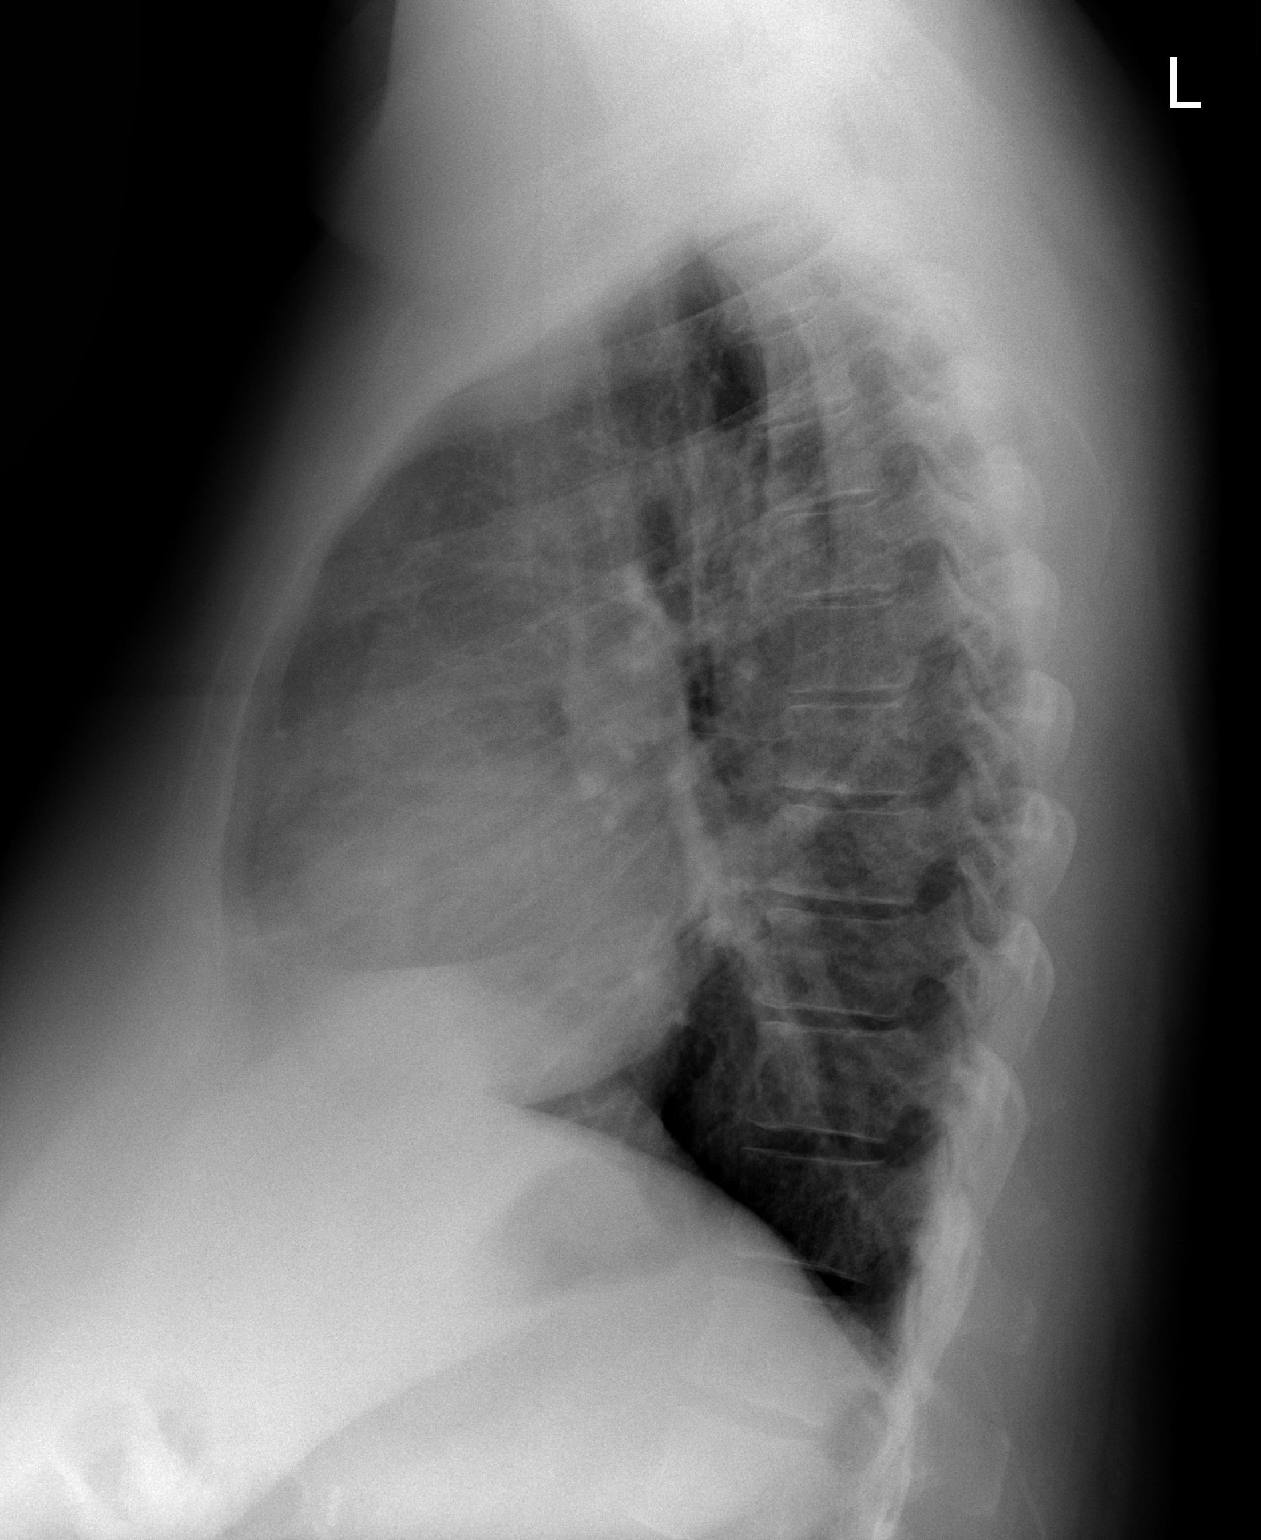

[2 of 2 positions shown; findings below may reference images not displayed]

FINDINGS: Heart size is at the upper limits of normal.  Mediastinum
is unremarkable.  There is bronchial thickening but no sign of
infiltrate, collapse or effusion.  No significant bony finding.
IMPRESSION: Bronchitis without consolidation or collapse.

## 2010-10-30 NOTE — Assessment & Plan Note (Signed)
Compensated on present regimen.  Cont w/ Elwin Sleight . Patient's medications were reviewed today and patient education was given. Computerized medication calendar was adjusted/completed

## 2010-11-14 ENCOUNTER — Ambulatory Visit: Payer: Self-pay | Admitting: Pulmonary Disease

## 2010-11-16 ENCOUNTER — Telehealth: Payer: Self-pay | Admitting: Internal Medicine

## 2010-11-16 MED ORDER — MOMETASONE FURO-FORMOTEROL FUM 200-5 MCG/ACT IN AERO: 2.0000 | INHALATION_SPRAY | Freq: Two times a day (BID) | RESPIRATORY_TRACT | Status: DC

## 2010-11-16 MED ORDER — ALBUTEROL SULFATE HFA 108 (90 BASE) MCG/ACT IN AERS: 2.0000 | INHALATION_SPRAY | Freq: Four times a day (QID) | RESPIRATORY_TRACT | Status: DC | PRN

## 2010-11-16 NOTE — Telephone Encounter (Signed)
Called and spoke with pt. Pt states when she was here on 10/26/10 for PFTs her meds were accidently left on the transportation bus.  Pt has called the transportation bus office but states no one has turned in her meds to the lost and found.  Therefore, pt requesting samples of both Dulera 200/5 and Ventolin.  Informed pt sample of Dulera left at front desk but no samples avail of Ventolin.  Pt requested rx be called into her pharmacy- [facility] at [phone] but office is closed for lunch from 1 to 2.  Will call back later today.

## 2010-11-16 NOTE — Telephone Encounter (Signed)
Called and spoke with [facility] and called in rx for Dulera and Ventolin.

## 2010-11-17 LAB — DIFFERENTIAL
Eosinophils Absolute: 0.2 10*3/uL (ref 0.0–0.7)
Eosinophils Relative: 3 % (ref 0–5)
Monocytes Absolute: 0.3 10*3/uL (ref 0.1–1.0)
Neutrophils Relative %: 61 % (ref 43–77)

## 2010-11-17 LAB — POCT I-STAT, CHEM 8
Calcium, Ion: 1.24 mmol/L (ref 1.12–1.32)
Chloride: 103 mEq/L (ref 96–112)
HCT: 41 % (ref 36.0–46.0)
Hemoglobin: 13.9 g/dL (ref 12.0–15.0)
TCO2: 26 mmol/L (ref 0–100)

## 2010-11-17 LAB — CBC
Hemoglobin: 12.7 g/dL (ref 12.0–15.0)
RDW: 15.1 % (ref 11.5–15.5)
WBC: 6.4 10*3/uL (ref 4.0–10.5)

## 2010-11-17 LAB — D-DIMER, QUANTITATIVE: D-Dimer, Quant: 0.63 ug/mL-FEU — ABNORMAL HIGH (ref 0.00–0.48)

## 2011-01-09 ENCOUNTER — Ambulatory Visit: Payer: Self-pay | Admitting: Pulmonary Disease

## 2011-01-09 ENCOUNTER — Ambulatory Visit: Payer: Self-pay | Admitting: Internal Medicine

## 2011-01-23 ENCOUNTER — Encounter: Payer: Self-pay | Admitting: Internal Medicine

## 2011-01-23 ENCOUNTER — Ambulatory Visit (INDEPENDENT_AMBULATORY_CARE_PROVIDER_SITE_OTHER): Payer: Self-pay | Admitting: Internal Medicine

## 2011-01-23 VITALS — BP 102/78 | HR 88 | Temp 98.5°F | Ht 66.0 in | Wt 371.2 lb

## 2011-01-23 DIAGNOSIS — J019 Acute sinusitis, unspecified: Secondary | ICD-10-CM | POA: Insufficient documentation

## 2011-01-23 DIAGNOSIS — J45909 Unspecified asthma, uncomplicated: Secondary | ICD-10-CM

## 2011-01-23 MED ORDER — DOXYCYCLINE HYCLATE 100 MG PO TABS: 100.0000 mg | ORAL_TABLET | Freq: Two times a day (BID) | ORAL | Status: DC

## 2011-01-23 NOTE — Progress Notes (Signed)
  Subjective:    Patient ID: Melissa Rowe, female    DOB: 01-22-1966, 45 y.o.   MRN: [id number]  HPI #Asthma -  #Vitamin D Def   OV 10/26/2010  Presents for follow up and med review. Pt doing good since last ov. Breathing is better w/ less cough or dyspnea.  Today spirometry shows FEV1 at 87% We reviewed all her meds today and organized them into a med calendar. She appears to be taking them correctly.  Today -10/26/2010 FEV1 2.33 L/87%, FVC 2.87 L/88% >cont on dulera   OV 01/23/2011 Followup asthma. Last saw NP 3 months ago. Singulair was keeping her awake so now taking in morning. Cntinues MDI. Past 7-9 days increased sinus drainage, post nasal drip and feeling unwell initially with subjective fever. Drainage was brown=yellow. Clearing up now but still with colored drainage and having to cough it out. Above has resultted in increasd albuterol use to daily and nocturnal awakenings. Though not wheezing per se. Note: has had flu shot    Review of Systems  Constitutional: Negative for fever and unexpected weight change.  HENT: Positive for congestion, sore throat, rhinorrhea, sneezing, trouble swallowing, postnasal drip and sinus pressure. Negative for ear pain, nosebleeds and dental problem.   Eyes: Positive for itching. Negative for redness.  Respiratory: Positive for cough, chest tightness, shortness of breath and wheezing.   Cardiovascular: Positive for leg swelling. Negative for palpitations.  Gastrointestinal: Negative for nausea and vomiting.  Genitourinary: Negative for dysuria.  Musculoskeletal: Positive for joint swelling.  Skin: Negative for rash.  Neurological: Positive for headaches.  Hematological: Does not bruise/bleed easily.  Psychiatric/Behavioral: Negative for dysphoric mood. The patient is not nervous/anxious.        Objective:   Physical Exam PE:  GEN: A/Ox3; pleasant , NAD  HEENT:  [place]/AT,  EACs-clear, TMs-wnl, NOSE-clear, THROAT-clear, no lesions, no  postnasal drip or exudate noted.   NECK:  Supple w/ fair ROM; no JVD; normal carotid impulses w/o bruits; no thyromegaly or nodules palpated; no lymphadenopathy.  RESP  Clear  P & A; w/o, wheezes/ rales/ or rhonchi.no accessory muscle use, no dullness to percussion  CARD:  RRR, no m/r/g  , no peripheral edema, pulses intact, no cyanosis or clubbing.  GI:   Soft & nt; nml bowel sounds; no organomegaly or masses detected.  Musco: Warm bil, no deformities or joint swelling noted.   Neuro: alert, no focal deficits noted.    Skin: Warm, no lesions or rashes    NO WHEEZE. NO CHANGE in EXAM      Assessment & Plan:

## 2011-01-23 NOTE — Patient Instructions (Addendum)
Continue your regular asthma medications You have a bit of residual sinus infection so Take doxycycline 100mg po twice daily x 8 days; take after meals and avoid sunlight If wheezing gets worse, call us or visit us and and we can consider steroid burst  

## 2011-01-23 NOTE — Assessment & Plan Note (Addendum)
Continue your regular asthma medications - we discussed prednisone burst but she is not wheezing and does not want to do pred burst at this point You have a bit of residual sinus infection so Take doxycycline 100mg  po twice daily x 8 days; take after meals and avoid sunlight If wheezing gets worse, call us or visit [place] and and we can consider steroid burst

## 2011-01-25 ENCOUNTER — Encounter: Payer: Self-pay | Admitting: Internal Medicine

## 2011-01-25 NOTE — Assessment & Plan Note (Signed)
Continue your regular asthma medications You have a bit of residual sinus infection so Take doxycycline 100mg  po twice daily x 8 days; take after meals and avoid sunlight If wheezing gets worse, call us or visit [place] and and we can consider steroid burst

## 2011-02-20 ENCOUNTER — Telehealth: Payer: Self-pay | Admitting: Internal Medicine

## 2011-02-20 NOTE — Telephone Encounter (Signed)
Please thank patient for xmas card  Thanks MR

## 2011-02-22 NOTE — Telephone Encounter (Signed)
Pt aware. Aram Domzalski, CMA  

## 2011-04-02 ENCOUNTER — Telehealth: Payer: Self-pay | Admitting: Internal Medicine

## 2011-04-02 NOTE — Telephone Encounter (Signed)
Pt c/o cough and congestion for several days but says she cannot come for OV today. She has scheduled an appt with TP on Tues., 2/19 @ 11:15am.

## 2011-04-03 ENCOUNTER — Ambulatory Visit (INDEPENDENT_AMBULATORY_CARE_PROVIDER_SITE_OTHER): Payer: Self-pay | Admitting: Adult Health

## 2011-04-03 ENCOUNTER — Encounter: Payer: Self-pay | Admitting: Adult Health

## 2011-04-03 VITALS — BP 116/62 | HR 98 | Temp 97.4°F | Ht 67.0 in | Wt 377.0 lb

## 2011-04-03 DIAGNOSIS — J45909 Unspecified asthma, uncomplicated: Secondary | ICD-10-CM

## 2011-04-03 DIAGNOSIS — J4540 Moderate persistent asthma, uncomplicated: Secondary | ICD-10-CM

## 2011-04-03 MED ORDER — AZITHROMYCIN 250 MG PO TABS: ORAL_TABLET | ORAL | Status: AC

## 2011-04-03 MED ORDER — PREDNISONE 10 MG PO TABS: ORAL_TABLET | ORAL | Status: DC

## 2011-04-03 MED ORDER — LEVALBUTEROL HCL 0.63 MG/3ML IN NEBU
0.6300 mg | INHALATION_SOLUTION | Freq: Once | RESPIRATORY_TRACT | Status: AC
  Administered 2011-04-03: 0.63 mg via RESPIRATORY_TRACT

## 2011-04-03 MED ORDER — HYDROCODONE-HOMATROPINE 5-1.5 MG/5ML PO SYRP: 5.0000 mL | ORAL_SOLUTION | Freq: Four times a day (QID) | ORAL | Status: AC | PRN

## 2011-04-03 NOTE — Progress Notes (Signed)
Subjective:    Patient ID: Randalyn Rhea, female    DOB: [birth year], 46 y.o.   MRN: [id number]  HPI 46 yo AAF morbidly obese with known hx of Asthma, OSA on CPAP   Dyspnea due to Obesity and Asthma -> 05/05/2008: Spirometry suggests restriction. CXR 04/19/2008  Normal. CT 02/10/2008  - Neg for PE. No infiltrates -> Methacholine Challenge Test: 06/25/2008: Positive PC20 between 1-4  - 10/11/2009: fev1 2L/70% -> start pulmicort  -11/08/2009: fev1 2.24L/78% and better - > switch to QVAR due to cost - May 2012: fev1 2.32L/85% and normal but symptomactic -> change QVAR to dulera sample,  6day pred burst - Jul 13, 2010: Fev1 2.5L/94%, FVC 3.21/98% and improved -> continue dulera -10/26/2010 FEV1 2.33 L/87%, FVC 2.87 L/88% >cont on dulera  - med calendar 10/26/10    OV 10/26/2010  Presents for follow up and med review. Pt doing good since last ov. Breathing is better w/ less cough or dyspnea.  Today spirometry shows FEV1 at 87% We reviewed all her meds today and organized them into a med calendar. She appears to be taking them correctly.  Today -10/26/2010 FEV1 2.33 L/87%, FVC 2.87 L/88% >cont on dulera   OV 01/23/2011 Followup asthma. Last saw NP 3 months ago. Singulair was keeping her awake so now taking in morning. Cntinues MDI. Past 7-9 days increased sinus drainage, post nasal drip and feeling unwell initially with subjective fever. Drainage was brown=yellow. Clearing up now but still with colored drainage and having to cough it out. Above has resultted in increasd albuterol use to daily and nocturnal awakenings. Though not wheezing per se. Note: has had flu shot  04/03/2011 Acute OV  Complains of Cough and congestion for 3 weeks with  non prod (occas yellow). Feels more SOB  And wheezing for last few day. Lots of coughing at night, drainage in throat.  No chest pain or increased edema. No hemoptysis.   Review of Systems Constitutional:   No  weight loss, night sweats,  Fevers, chills, +  fatigue, or  lassitude.  HEENT:   No headaches,  Difficulty swallowing,  Tooth/dental problems, or  Sore throat,                No sneezing, itching, ear ache, + nasal congestion, post nasal drip,   CV:  No chest pain,  Orthopnea, PND,   anasarca, dizziness, palpitations, syncope.   GI  No heartburn, indigestion, abdominal pain, nausea, vomiting, diarrhea, change in bowel habits, loss of appetite, bloody stools.   Resp:    No coughing up of blood.   [name]  No chest wall deformity  Skin: no rash or lesions.  GU: no dysuria, change in color of urine, no urgency or frequency.  No flank pain, no hematuria   MS:  No joint pain or swelling.  No decreased range of motion.  No back pain.  Psych:  No change in mood or affect. No depression or anxiety.  No memory loss.         Objective:   Physical Exam GEN: A/Ox3; pleasant , NAD, morbidly obese   HEENT:  [place]/AT,  EACs-clear, TMs-wnl, NOSE-clear, THROAT-clear, no lesions, no postnasal drip or exudate noted.   NECK:  Supple w/ fair ROM; no JVD; normal carotid impulses w/o bruits; no thyromegaly or nodules palpated; no lymphadenopathy.  RESP  Coarse BS, faint exp wheeze no accessory muscle use, no dullness to percussion  CARD:  RRR, no m/r/g  , tr-1 + peripheral  edema, pulses intact, no cyanosis or clubbing. Venous insufficiency changes   GI:   Soft & nt; nml bowel sounds; no organomegaly or masses detected.  Musco: Warm bil, no deformities or joint swelling noted.   Neuro: alert, no focal deficits noted.    Skin: Warm, no lesions or rashes         Assessment & Plan:

## 2011-04-03 NOTE — Patient Instructions (Signed)
Zpack take as directed.  Mucinex DM Twice daily  As needed  Cough/congestion  Prednisone taper over next week.  Fluids and rest  Saline nasal rinses As needed   Follow up Dr. Marchelle Gearing in 2-3 weeks and As needed   Please contact office for sooner follow up if symptoms do not improve or worsen or seek emergency care

## 2011-04-06 NOTE — Assessment & Plan Note (Signed)
Flare with URI  Plan:    Zpack take as directed.  Mucinex DM Twice daily  As needed  Cough/congestion  Prednisone taper over next week.  Fluids and rest  Saline nasal rinses As needed   Follow up Dr. Marchelle Gearing in 2-3 weeks and As needed   Please contact office for sooner follow up if symptoms do not improve or worsen or seek emergency care

## 2011-04-24 ENCOUNTER — Encounter: Payer: Self-pay | Admitting: Internal Medicine

## 2011-04-24 ENCOUNTER — Ambulatory Visit (INDEPENDENT_AMBULATORY_CARE_PROVIDER_SITE_OTHER): Payer: Self-pay | Admitting: Internal Medicine

## 2011-04-24 VITALS — BP 124/78 | HR 88 | Temp 98.3°F | Ht 67.0 in | Wt 378.2 lb

## 2011-04-24 DIAGNOSIS — J45909 Unspecified asthma, uncomplicated: Secondary | ICD-10-CM

## 2011-04-24 DIAGNOSIS — J4540 Moderate persistent asthma, uncomplicated: Secondary | ICD-10-CM

## 2011-04-24 NOTE — Progress Notes (Signed)
Subjective:    Patient ID: Melissa Rowe, female    DOB: Jan 15, 1966, 46 y.o.   MRN: [id number]  HPI 46 yo AAF morbidly obese with known hx of Asthma, OSA on CPAP   Dyspnea due to Obesity and Asthma -> 05/05/2008: Spirometry suggests restriction. CXR 04/19/2008  Normal. CT 02/10/2008  - Neg for PE. No infiltrates -> Methacholine Challenge Test: 06/25/2008: Positive PC20 between 1-4  - 10/11/2009: fev1 2L/70% -> start pulmicort  -11/08/2009: fev1 2.24L/78% and better - > switch to QVAR due to cost - May 2012: fev1 2.32L/85% and normal but symptomactic -> change QVAR to dulera sample,  6day pred burst - Jul 13, 2010: Fev1 2.5L/94%, FVC 3.21/98% and improved -> continue dulera -10/26/2010 FEV1 2.33 L/87%, FVC 2.87 L/88% >cont on dulera  - med calendar 10/26/10    OV 10/26/2010  Presents for follow up and med review. Pt doing good since last ov. Breathing is better w/ less cough or dyspnea.  Today spirometry shows FEV1 at 87% We reviewed all her meds today and organized them into a med calendar. She appears to be taking them correctly.  Today -10/26/2010 FEV1 2.33 L/87%, FVC 2.87 L/88% >cont on dulera   OV 01/23/2011 Followup asthma. Last saw NP 3 months ago. Singulair was keeping her awake so now taking in morning. Cntinues MDI. Past 7-9 days increased sinus drainage, post nasal drip and feeling unwell initially with subjective fever. Drainage was brown=yellow. Clearing up now but still with colored drainage and having to cough it out. Above has resultted in increasd albuterol use to daily and nocturnal awakenings. Though not wheezing per se. Note: has had flu shot  04/03/2011 Acute OV  Complains of Cough and congestion for 3 weeks with  non prod (occas yellow). Feels more SOB  And wheezing for last few day. Lots of coughing at night, drainage in throat.  No chest pain or increased edema. No hemoptysis.   REC Zpack take as directed.  Mucinex DM Twice daily As needed Cough/congestion  Prednisone  taper over next week.  Fluids and rest  Saline nasal rinses As needed  Follow up Dr. Marchelle Gearing in 2-3 weeks and As needed  Please contact office for sooner follow up if symptoms do not improve or worsen or seek emergency care    OV 04/24/2011 Followup asthma. Feels well. No cold. No drainage. Using dulera 2 puff bid and on singulair (takes it in am because it causes insomnia at night). Compliant with medication. Rinses mouth. Using albuterol 2-3 times per day which is baseline. No side effect with medication. No new problems  Past, Family, Social reviewed: no change since last visit. No new admissions. No new health diagnosis. Husband Berna Spare is on ESRD. Sister in law: also chronic health issues   Review of Systems  Constitutional: Negative for fever and unexpected weight change.  HENT: Negative for ear pain, nosebleeds, congestion, sore throat, rhinorrhea, sneezing, trouble swallowing, dental problem, postnasal drip and sinus pressure.   Eyes: Negative for redness and itching.  Respiratory: Negative for cough, chest tightness, shortness of breath and wheezing.   Cardiovascular: Negative for palpitations and leg swelling.  Gastrointestinal: Negative for nausea and vomiting.  Genitourinary: Negative for dysuria.  Musculoskeletal: Negative for joint swelling.  Skin: Negative for rash.  Neurological: Negative for headaches.  Hematological: Does not bruise/bleed easily.  Psychiatric/Behavioral: Negative for dysphoric mood. The patient is not nervous/anxious.        Objective:   Physical Exam GEN: A/Ox3; pleasant ,  NAD, morbidly obese   HEENT:  [place]/AT,  EACs-clear, TMs-wnl, NOSE-clear, THROAT-clear, no lesions, no postnasal drip or exudate noted.   NECK:  Supple w/ fair ROM; no JVD; normal carotid impulses w/o bruits; no thyromegaly or nodules palpated; no lymphadenopathy.  RESP  Coarse BS, faint exp wheeze no accessory muscle use, no dullness to percussion  CARD:  RRR, no m/r/g  ,  tr-1 + peripheral edema, pulses intact, no cyanosis or clubbing. Venous insufficiency changes   GI:   Soft & nt; nml bowel sounds; no organomegaly or masses detected.  Musco: Warm bil, no deformities or joint swelling noted.   Neuro: alert, no focal deficits noted.    Skin: Warm, no lesions or rashes        Assessment & Plan:

## 2011-04-24 NOTE — Patient Instructions (Signed)
#  Asthma  - glad you are doing well  - continue dulera and singulair as before  - take several samples of dulera from my nurse  - use albuterol as needed #weight control  - take [facility] diet sheet from my nurse  - stick to foods in the left most column. You should eat foods in middle column maybe once every 2 weeks and foods in the right column maybe 4-6 times a year #followu  - 5-6 months with spirometry at followup

## 2011-04-25 ENCOUNTER — Encounter: Payer: Self-pay | Admitting: Internal Medicine

## 2011-04-25 NOTE — Assessment & Plan Note (Signed)
#  weight control  - take [facility] diet sheet from my nurse  - stick to foods in the left most column. You should eat foods in middle column maybe once every 2 weeks and foods in the right column maybe 4-6 times a year

## 2011-04-25 NOTE — Assessment & Plan Note (Signed)
#  Asthma  - glad you are doing well; unclear if baseline use of 1-2 per day reflects pscyhological addiction due to dyspnea from obesity (likely) v poor asthma control  - continue dulera and singulair as before  - take several samples of dulera from my nurse  - use albuterol as needed #followu  - 5-6 months with spirometry at followup

## 2011-04-29 ENCOUNTER — Encounter (HOSPITAL_COMMUNITY): Payer: Self-pay | Admitting: Emergency Medicine

## 2011-04-29 ENCOUNTER — Emergency Department (HOSPITAL_COMMUNITY)
Admission: EM | Admit: 2011-04-29 | Discharge: 2011-04-29 | Disposition: A | Discharge status: Home / Self Care | Payer: Self-pay | Attending: Emergency Medicine | Admitting: Emergency Medicine

## 2011-04-29 DIAGNOSIS — Z87891 Personal history of nicotine dependence: Secondary | ICD-10-CM | POA: Insufficient documentation

## 2011-04-29 DIAGNOSIS — L509 Urticaria, unspecified: Secondary | ICD-10-CM | POA: Insufficient documentation

## 2011-04-29 MED ORDER — HYDROXYZINE HCL 25 MG PO TABS
25.0000 mg | ORAL_TABLET | Freq: Once | ORAL | Status: AC
  Administered 2011-04-29: 25 mg via ORAL
  Filled 2011-04-29: qty 1

## 2011-04-29 MED ORDER — PREDNISONE 20 MG PO TABS
40.0000 mg | ORAL_TABLET | Freq: Once | ORAL | Status: AC
  Administered 2011-04-29: 40 mg via ORAL
  Filled 2011-04-29: qty 2

## 2011-04-29 MED ORDER — HYDROXYZINE HCL 25 MG PO TABS: 25.0000 mg | ORAL_TABLET | Freq: Four times a day (QID) | ORAL | Status: AC

## 2011-04-29 MED ORDER — EPINEPHRINE 0.3 MG/0.3ML IJ DEVI
0.3000 mg | Freq: Once | INTRAMUSCULAR | Status: AC
  Administered 2011-04-29: 0.3 mg via SUBCUTANEOUS
  Filled 2011-04-29: qty 0.3

## 2011-04-29 MED ORDER — PREDNISONE 10 MG PO TABS: 20.0000 mg | ORAL_TABLET | Freq: Two times a day (BID) | ORAL | Status: DC

## 2011-04-29 NOTE — ED Notes (Signed)
Pt state that the itching has decreased

## 2011-04-29 NOTE — ED Notes (Signed)
Pt reports generalized body rash and hives onset Monday. Pt reports got a tattoo on February 28th, but did not remember getting the tattoo due to alcohol consumption.

## 2011-04-29 NOTE — Discharge Instructions (Signed)
Allergic Reaction  Allergic reactions can be caused by anything your body is sensitive to. Your body may be sensitive to food, medicines, molds, pollens, cockroaches, dust mites, pets, insect stings, and other things around you. An allergic reaction may cause puffiness (swelling), itching, sneezing, coughing, or problems breathing.   Allergies cannot be cured, but they can be controlled with medicine. Some allergies happen only at certain times of the year. Try to stay away from what causes your reaction if possible. Sometimes, it is hard to tell what causes your reaction.  HOME CARE  If you have a rash or red patches (hives) on your skin:   Take medicines as told by your doctor.   Do not drive or drink alcohol after taking medicines. They can make you sleepy.   Put cold cloths on your skin. Take baths in cool water. This will help your itching. Do not take hot baths or showers. Heat will make the itching worse.   If your allergies get worse, your doctor might give you other medicines. Talk to your doctor if problems continue.  GET HELP RIGHT AWAY IF:    You have trouble breathing.   You have a tight feeling in your chest or throat.   Your mouth gets puffy (swollen).   You have red, itchy patches on your skin (hives) that get worse.   You have itching all over your body.  MAKE SURE YOU:    Understand these instructions.   Will watch your condition.   Will get help right away if you are not doing well or get worse.  Document Released: 01/17/2009 Document Revised: 01/18/2011 Document Reviewed: 01/17/2009  [employer] Patient Information 2012 [employer].

## 2011-04-29 NOTE — ED Provider Notes (Signed)
History     CSN: [id number]  Arrival date & time 04/29/11  1553   First MD Initiated Contact with Patient 04/29/11 1719      Chief Complaint  Patient presents with  . Rash  . Urticaria    (Consider location/radiation/quality/duration/timing/severity/associated sxs/prior treatment) Patient is a 46 y.o. female presenting with rash and urticaria. The history is provided by the patient.  Rash  This is a new problem. Episode onset: 5 days ago. The problem has been gradually worsening. The problem is associated with nothing. There has been no fever. Affected Location: arms, legs, torso. The patient is experiencing no pain. Associated symptoms include itching. She has tried antihistamines for the symptoms. The treatment provided no relief.  Urticaria    Past Medical History  Diagnosis Date  . Dyspnea   . Gastroenteritis   . Hyperlipidemia   . Diverticulitis   . Atrial mass   . OSA (obstructive sleep apnea)     Past Surgical History  Procedure Date  . Cholecystectomy 2008  . Polyps removal 2007  . Tubal ligation 1992    Family History  Problem Relation Age of Onset  . Heart murmur Mother     rheumatoid arthritis  . Asthma Brother     and mother both as a child    History  Substance Use Topics  . Smoking status: Former Smoker -- 2.0 packs/day for 7 years    Types: Cigarettes    Quit date: 02/12/1990  . Smokeless tobacco: Not on file  . Alcohol Use: Yes     occassional    OB History    Grav Para Term Preterm Abortions TAB SAB Ect Mult Living                  Review of Systems  Skin: Positive for itching and rash.  All other systems reviewed and are negative.    Allergies  Review of patient's allergies indicates no known allergies.  Home Medications   Current Outpatient Rx  Name Route Sig Dispense Refill  . ALBUTEROL SULFATE HFA 108 (90 BASE) MCG/ACT IN AERS Inhalation Inhale 2 puffs into the lungs every 6 (six) hours as needed. For shortness of  breath    . CALCIUM CARBONATE 600 MG PO TABS Oral Take 600 mg by mouth daily.     . CHLORPHENIRAMINE MALEATE 4 MG PO TABS Oral Take 4 mg by mouth every 6 (six) hours as needed.     [name] VITAMIN D3 50000 UNITS PO CAPS Oral Take 50,000 Units by mouth once a week. Take 50,000 IU on Sundays in the AM    . CYCLOBENZAPRINE HCL 10 MG PO TABS Oral Take 10 mg by mouth 2 (two) times daily as needed. For muscle spasms    . FERROUS SULFATE 325 (65 FE) MG PO TABS  1 tablet in the morning and 1 tablet in the afternoon     . MOMETASONE FURO-FORMOTEROL FUM 200-5 MCG/ACT IN AERO Inhalation Inhale 2 puffs into the lungs 2 (two) times daily.    [name] MONTELUKAST SODIUM 10 MG PO TABS Oral Take 10 mg by mouth at bedtime.    . MULTIVITAMINS PO CAPS Oral Take 1 capsule by mouth daily.      [name] NAPROXEN 500 MG PO TABS Oral Take 500 mg by mouth 2 (two) times daily as needed. For pain    . TRAMADOL HCL 50 MG PO TABS Oral Take 50 mg by mouth every 6 (six) hours as needed.  BP 119/76  Pulse 107  Temp(Src) 97.8 F (36.6 C) (Oral)  Resp 20  SpO2 97%  LMP 04/26/2011  Physical Exam  Nursing note and vitals reviewed. Constitutional: She is oriented to person, place, and time. She appears well-developed and well-nourished. No distress.  HENT:  Head: Normocephalic and atraumatic.  Neck: Normal range of motion. Neck supple.  Cardiovascular: Normal rate and regular rhythm.  Exam reveals no gallop and no friction rub.   No murmur heard. Pulmonary/Chest: Effort normal and breath sounds normal. No respiratory distress. She has no wheezes.  Abdominal: Soft. Bowel sounds are normal. She exhibits no distension. There is no tenderness.  Musculoskeletal: Normal range of motion.  Neurological: She is alert and oriented to person, place, and time.  Skin: Skin is warm and dry. She is not diaphoretic.       There is an urticarial rash to all four extremities, torso.      ED Course  Procedures (including critical care  time)  Labs Reviewed - No data to display No results found.   No diagnosis found.    MDM  Feels better with meds given.  Will discharge to home with steroids, atarax.        Geoffery Lyons, MD 04/29/11 [phone]

## 2011-05-24 ENCOUNTER — Telehealth: Payer: Self-pay | Admitting: Internal Medicine

## 2011-05-24 NOTE — Telephone Encounter (Signed)
Forms have been received and spoke with TP and she stated ok to fill these out and stamp MR name to these.  These forms have been completed but have to be mailed back--[facility] stated not able to fax these forms in.  Copy has been placed in scan folder for MR.  DP stated that they have already marked the pt to have her power back on ASAP and they will have this back on soon for her.

## 2011-05-24 NOTE — Telephone Encounter (Signed)
Called and spoke with pt and she stated that [facility] turned her power off today and she is needing [place] to call and have them turn the power back on due to her severe asthma.  She is unable to use her cpap and this is causing problems for her.  She is aware that she will need to call [facility] and have them fax over a form to be completed.  Pt will do this now.

## 2011-06-26 ENCOUNTER — Ambulatory Visit (INDEPENDENT_AMBULATORY_CARE_PROVIDER_SITE_OTHER): Payer: Self-pay | Admitting: Pulmonary Disease

## 2011-06-26 ENCOUNTER — Encounter: Payer: Self-pay | Admitting: Pulmonary Disease

## 2011-06-26 VITALS — BP 118/78 | HR 84 | Temp 98.3°F | Ht 66.0 in | Wt 381.6 lb

## 2011-06-26 DIAGNOSIS — G4733 Obstructive sleep apnea (adult) (pediatric): Secondary | ICD-10-CM

## 2011-06-26 NOTE — Progress Notes (Signed)
Chief Complaint  Patient presents with  . Sleep Apnea    last seen 11/2009. Pt states she wears her cpap everynight x7-9 hrs a night. denies any problems w/ mask/machine. feels like the pressure may need to be readjusted bc she doesn;t feel like it's helping. She is extremely exhausted, sleepy during the day    History of Present Illness: Melissa Rowe is a 46 y.o. female with OSA on CPAP 12 cm H2O.  She was last seen by me October 2011.  She has been feeling more sleepy during the day.  She goes to bed at 10 pm.  She can take several hours to fall asleep. She is not using anything to help her sleep.  She will lay in bed and look at the clock.  Once she is asleep she will wake up frequently.  She wakes up feeling like she can't breath and has a choking sensation.  She will also use the bathroom several times per night.  She gets out of bed at 9 am.  She feels exhausted.  She will sit down in her chair, and will sleep for several hours.  She reports using her CPAP every night.  She has nasal mask with chin strap.  She does not think her mask is leaking, and she got a new mask recently.  Her husband says she talks in her sleep, and she has been snoring more.   Past Medical History  Diagnosis Date  . Dyspnea   . Gastroenteritis   . Hyperlipidemia   . Diverticulitis   . Atrial mass   . OSA (obstructive sleep apnea)     Past Surgical History  Procedure Date  . Cholecystectomy 2008  . Polyps removal 2007  . Tubal ligation 1992    No Known Allergies  Physical Exam:  Blood pressure 118/78, pulse 84, temperature 98.3 F (36.8 C), temperature source Oral, height 5\' 6"  (1.676 m), weight 381 lb 9.6 oz (173.093 kg), SpO2 100.00%.  Body mass index is 61.59 kg/(m^2).  Wt Readings from Last 2 Encounters:  06/26/11 381 lb 9.6 oz (173.093 kg)  04/24/11 378 lb 3.2 oz (171.55 kg)    General - Obese HEENT - no sinus tenderness, no oral exudate, no LAN Cardiac - s1s2 regular Chest - no  wheeze/rales Abdomen - obese, soft Extremities - no edema Skin - no rashes Neurologic - normal strength Psychiatric - normal mood, behavior   Assessment/Plan:  Outpatient Encounter Prescriptions as of 06/26/2011  Medication Sig Dispense Refill  . albuterol (PROVENTIL HFA;VENTOLIN HFA) 108 (90 BASE) MCG/ACT inhaler Inhale 2 puffs into the lungs every 6 (six) hours as needed. For shortness of breath      . calcium carbonate (OS-CAL) 600 MG TABS Take 600 mg by mouth daily.       . chlorpheniramine (CHLOR-TRIMETON) 4 MG tablet Take 4 mg by mouth every 6 (six) hours as needed.       . Cholecalciferol (VITAMIN D3) 50000 UNITS CAPS Take 50,000 Units by mouth once a week. Take 50,000 IU on Sundays in the AM      . cyclobenzaprine (FLEXERIL) 10 MG tablet Take 10 mg by mouth 2 (two) times daily as needed. For muscle spasms      . Mometasone Furo-Formoterol Fum 200-5 MCG/ACT AERO Inhale 2 puffs into the lungs 2 (two) times daily.      . montelukast (SINGULAIR) 10 MG tablet Take 10 mg by mouth daily.       . Multiple Vitamin (  MULTIVITAMIN) capsule Take 1 capsule by mouth daily.        . naproxen (NAPROSYN) 500 MG tablet Take 500 mg by mouth 2 (two) times daily as needed. For pain      . Omeprazole-Sodium Bicarbonate (ZEGERID OTC PO) Take 1 tablet by mouth daily.      . traMADol (ULTRAM) 50 MG tablet Take 50 mg by mouth every 6 (six) hours as needed.        [name] DISCONTD: ferrous sulfate (FERROUSUL) 325 (65 FE) MG tablet 1 tablet in the morning and 1 tablet in the afternoon       . DISCONTD: predniSONE (DELTASONE) 10 MG tablet Take 2 tablets (20 mg total) by mouth 2 (two) times daily.  16 tablet  0    Renai Lopata Pager:  [phone] 06/26/2011, 10:56 AM

## 2011-06-26 NOTE — Assessment & Plan Note (Signed)
She has more trouble with her sleep.  I will assess her CPAP set up first.  Will get copy of her CPAP download and arrange for ONO with CPAP and room air.  Depending on results of these will determine is she needs repeat in-lab titration study.  She may also need further interventions for her sleep hygiene.  Again explained how her weight is affecting her sleep.

## 2011-06-26 NOTE — Patient Instructions (Signed)
Will get report from CPAP machine Will arrange for oxygen test over night while wearing CPAP Follow up in 6 weeks

## 2011-07-13 ENCOUNTER — Telehealth: Payer: Self-pay | Admitting: Pulmonary Disease

## 2011-07-13 DIAGNOSIS — G4733 Obstructive sleep apnea (adult) (pediatric): Secondary | ICD-10-CM

## 2011-07-13 NOTE — Telephone Encounter (Signed)
CPAP 01/12/11 to 07/12/11>>Used on 181 of 182 nights with average 10 hrs 53 min.  Average AHI 2.3 with CPAP 12 cm H2O.  Will have my nurse inform patient that CPAP report looks good.  Will call back once ONO reviewed.

## 2011-07-13 NOTE — Telephone Encounter (Signed)
[id number] x1 

## 2011-07-16 NOTE — Telephone Encounter (Signed)
Returning Mindy's call

## 2011-07-17 NOTE — Telephone Encounter (Signed)
I spoke with patient about results and she verbalized understanding and had no questions 

## 2011-07-18 ENCOUNTER — Telehealth: Payer: Self-pay | Admitting: Pulmonary Disease

## 2011-07-18 DIAGNOSIS — G4733 Obstructive sleep apnea (adult) (pediatric): Secondary | ICD-10-CM

## 2011-07-18 NOTE — Telephone Encounter (Signed)
ONO with CPAP and RA 07/13/11>>test time 9 hrs 13 min.  Mean SpO2 96%, low SpO2 92%.  Left message detailing results of ONO.   No change to current CPAP set up.  Will discuss in more detail at [facility].  Advised her to call back if she has questions before next visit.

## 2011-07-31 ENCOUNTER — Encounter: Payer: Self-pay | Admitting: Pulmonary Disease

## 2011-08-28 ENCOUNTER — Ambulatory Visit: Payer: Self-pay | Admitting: Pulmonary Disease

## 2011-09-21 ENCOUNTER — Encounter: Payer: Self-pay | Admitting: Internal Medicine

## 2011-09-21 ENCOUNTER — Ambulatory Visit (INDEPENDENT_AMBULATORY_CARE_PROVIDER_SITE_OTHER): Payer: Self-pay | Admitting: Internal Medicine

## 2011-09-21 VITALS — BP 112/70 | HR 89 | Temp 97.0°F | Ht 66.0 in | Wt 371.6 lb

## 2011-09-21 DIAGNOSIS — J45909 Unspecified asthma, uncomplicated: Secondary | ICD-10-CM

## 2011-09-21 DIAGNOSIS — J4540 Moderate persistent asthma, uncomplicated: Secondary | ICD-10-CM

## 2011-09-21 MED ORDER — LEVALBUTEROL HCL 0.63 MG/3ML IN NEBU
0.6300 mg | INHALATION_SOLUTION | Freq: Once | RESPIRATORY_TRACT | Status: AC
  Administered 2011-09-21: 0.63 mg via RESPIRATORY_TRACT

## 2011-09-21 MED ORDER — METHYLPREDNISOLONE ACETATE 80 MG/ML IJ SUSP
80.0000 mg | Freq: Once | INTRAMUSCULAR | Status: AC
  Administered 2011-09-21: 80 mg via INTRAMUSCULAR

## 2011-09-21 MED ORDER — PREDNISONE 10 MG PO TABS: ORAL_TABLET | ORAL | Status: DC

## 2011-09-21 MED ORDER — DOXYCYCLINE HYCLATE 100 MG PO TABS: 100.0000 mg | ORAL_TABLET | Freq: Two times a day (BID) | ORAL | Status: AC

## 2011-09-21 NOTE — Progress Notes (Signed)
Subjective:    Patient ID: Melissa Rowe, female    DOB: [birth year], 46 y.o.   MRN: [id number]  HPI  46 yo AAF morbidly obese with known hx of Asthma, OSA on CPAP (OSA fu by Dr Craige Cotta)  Dyspnea due to Obesity and Asthma -> 05/05/2008: Spirometry suggests restriction. CXR 04/19/2008  Normal. CT 02/10/2008  - Neg for PE. No infiltrates -> Methacholine Challenge Test: 06/25/2008: Positive PC20 between 1-4  - 10/11/2009: fev1 2L/70% -> start pulmicort  -11/08/2009: fev1 2.24L/78% and better - > switch to QVAR due to cost - May 2012: fev1 2.32L/85% and normal but symptomactic -> change QVAR to dulera sample,  6day pred burst - Jul 13, 2010: Fev1 2.5L/94%, FVC 3.21/98% and improved -> continue dulera -10/26/2010 FEV1 2.33 L/87%, FVC 2.87 L/88% >cont on dulera  - 09/21/2011 Fev1 2L/78%, Ration 80  - med calendar 10/26/10    OV 10/26/2010  Presents for follow up and med review. Pt doing good since last ov. Breathing is better w/ less cough or dyspnea.  Today spirometry shows FEV1 at 87% We reviewed all her meds today and organized them into a med calendar. She appears to be taking them correctly.  Today -10/26/2010 FEV1 2.33 L/87%, FVC 2.87 L/88% >cont on dulera   OV 01/23/2011 Followup asthma. Last saw NP 3 months ago. Singulair was keeping her awake so now taking in morning. Cntinues MDI. Past 7-9 days increased sinus drainage, post nasal drip and feeling unwell initially with subjective fever. Drainage was brown=yellow. Clearing up now but still with colored drainage and having to cough it out. Above has resultted in increasd albuterol use to daily and nocturnal awakenings. Though not wheezing per se. Note: has had flu shot  04/03/2011 Acute OV  Complains of Cough and congestion for 3 weeks with  non prod (occas yellow). Feels more SOB  And wheezing for last few day. Lots of coughing at night, drainage in throat.  No chest pain or increased edema. No hemoptysis.   REC Zpack take as directed.    Mucinex DM Twice daily As needed Cough/congestion  Prednisone taper over next week.  Fluids and rest  Saline nasal rinses As needed  Follow up Dr. Marchelle Gearing in 2-3 weeks and As needed  Please contact office for sooner follow up if symptoms do not improve or worsen or seek emergency care    OV 04/24/2011 Followup asthma. Feels well. No cold. No drainage. Using dulera 2 puff bid and on singulair (takes it in am because it causes insomnia at night). Compliant with medication. Rinses mouth. Using albuterol 2-3 times per day which is baseline. No side effect with medication. No new problems  Past, Family, Social reviewed: no change since last visit. No new admissions. No new health diagnosis. Husband Berna Spare is on ESRD. Sister in law: also chronic health issues  OV 09/21/2011 Last several weeks increased cough, dyspnea, chest tighnesss, wheeze. Progressive. Associated with fatigue but no fever. Moderate - severe  In severity. Says saw Dr Craige Cotta and had sleep eval and AHI was < 3 on cpap. She is puzzled why she has her symptoms. Spirometry today shows Fev1 2L/78% with ratio 80 and somewhat diminished compared to baseline  Past, Family, Social reviewed: no change since last visit     Review of Systems  Constitutional: Positive for unexpected weight change. Negative for fever.  HENT: Positive for sneezing. Negative for ear pain, nosebleeds, congestion, sore throat, rhinorrhea, trouble swallowing, dental problem, postnasal drip and sinus pressure.  Eyes: Negative for redness and itching.  Respiratory: Positive for cough, chest tightness, shortness of breath and wheezing.   Cardiovascular: Positive for leg swelling. Negative for palpitations.  Gastrointestinal: Negative for nausea and vomiting.  Genitourinary: Negative for dysuria.  Musculoskeletal: Positive for joint swelling.  Skin: Negative for rash.  Neurological: Positive for headaches.  Hematological: Does not bruise/bleed easily.   Psychiatric/Behavioral: Negative for dysphoric mood. The patient is nervous/anxious.        Objective:   Physical Exam  EN: A/Ox3; pleasant , NAD, morbidly obese   HEENT:  [place]/AT,  EACs-clear, TMs-wnl, NOSE-clear, THROAT-clear, no lesions, no postnasal drip or exudate noted.   NECK:  Supple w/ fair ROM; no JVD; normal carotid impulses w/o bruits; no thyromegaly or nodules palpated; no lymphadenopathy.  RESP  Coarse BS, faint exp wheeze . COUGHING A  LOT. Unable to complete hx. But no acc muscle use  CARD:  RRR, no m/r/g  , tr-1 + peripheral edema, pulses intact, no cyanosis or clubbing. Venous insufficiency changes   GI:   Soft & nt; nml bowel sounds; no organomegaly or masses detected.  Musco: Warm bil, no deformities or joint swelling noted.   Neuro: alert, no focal deficits noted.    Skin: Warm, no lesions or rashes          Assessment & Plan:

## 2011-09-21 NOTE — Patient Instructions (Addendum)
You are having an asthma attack We will give you IM depot medrol 80mg  x 1 shot now Take doxycycline 100mg  po twice daily x 5 days; take after meals and avoid sunlight Please take starting tomorrow 09/22/11 prednisone 40mg  once daily x 3 days, then 30mg  once daily x 3 days, then 20mg  once daily x 3 days, then prednisone 10mg  once daily  x 3 days and stop

## 2011-09-27 ENCOUNTER — Encounter: Payer: Self-pay | Admitting: Pulmonary Disease

## 2011-09-27 ENCOUNTER — Ambulatory Visit (INDEPENDENT_AMBULATORY_CARE_PROVIDER_SITE_OTHER): Payer: Self-pay | Admitting: Pulmonary Disease

## 2011-09-27 VITALS — BP 118/64 | HR 100 | Temp 98.2°F | Ht 66.0 in | Wt 384.6 lb

## 2011-09-27 DIAGNOSIS — G4733 Obstructive sleep apnea (adult) (pediatric): Secondary | ICD-10-CM

## 2011-09-27 NOTE — Patient Instructions (Signed)
Follow up in 4 months 

## 2011-09-27 NOTE — Progress Notes (Signed)
Chief Complaint  Patient presents with  . Follow-up    Pt states she wears her cpap machine everynight x 9 hrs or more and uses it during naps. pt statesshe still wakes up in the middle of the night    History of Present Illness: Melissa Rowe is a 46 y.o. female with OSA on CPAP 12 cm H2O.  CPAP 01/12/11 to 07/12/11>>Used on 181 of 182 nights with average 10 hrs 53 min. Average AHI 2.3 with CPAP 12 cm H2O.  ONO with CPAP and RA 07/13/11>>test time 9 hrs 13 min. Mean SpO2 96%, low SpO2 92%.  She was started on Abx and prednisone last week for her asthma.  She still feels sleepy during the day.  She feels her machine is blowing hot air.   Past Medical History  Diagnosis Date  . Dyspnea   . Gastroenteritis   . Hyperlipidemia   . Diverticulitis   . Atrial mass   . OSA (obstructive sleep apnea)     Past Surgical History  Procedure Date  . Cholecystectomy 2008  . Polyps removal 2007  . Tubal ligation 1992    No Known Allergies  Physical Exam:  Blood pressure 118/64, pulse 100, temperature 98.2 F (36.8 C), temperature source Oral, height 5\' 6"  (1.676 m), weight 384 lb 9.6 oz (174.453 kg), SpO2 97.00%.  Body mass index is 62.08 kg/(m^2).  Wt Readings from Last 2 Encounters:  09/27/11 384 lb 9.6 oz (174.453 kg)  09/21/11 371 lb 9.6 oz (168.557 kg)    General - Obese HEENT - no sinus tenderness, no oral exudate, no LAN Cardiac - s1s2 regular Chest - no wheeze/rales Abdomen - obese, soft Extremities - no edema Skin - no rashes Neurologic - normal strength Psychiatric - normal mood, behavior   Assessment/Plan:  Outpatient Encounter Prescriptions as of 09/27/2011  Medication Sig Dispense Refill  . albuterol (PROVENTIL HFA;VENTOLIN HFA) 108 (90 BASE) MCG/ACT inhaler Inhale 2 puffs into the lungs every 6 (six) hours as needed. For shortness of breath       . calcium carbonate (OS-CAL) 600 MG TABS Take 600 mg by mouth daily.       . chlorpheniramine  (CHLOR-TRIMETON) 4 MG tablet Take 4 mg by mouth every 6 (six) hours as needed.       . Cholecalciferol (VITAMIN D3) 50000 UNITS CAPS ON HOLD      . cyclobenzaprine (FLEXERIL) 10 MG tablet Take 10 mg by mouth 2 (two) times daily as needed. For muscle spasms      . doxycycline (VIBRA-TABS) 100 MG tablet Take 1 tablet (100 mg total) by mouth 2 (two) times daily. After meals and avoid sunlight  10 tablet  0  . furosemide (LASIX) 20 MG tablet Take 20 mg by mouth 2 (two) times daily.      [name] HYDROcodone-acetaminophen (NORCO/VICODIN) 5-325 MG per tablet Take 1-2 tablets by mouth daily as needed.      [name] levothyroxine (SYNTHROID, LEVOTHROID) 25 MCG tablet Take 50 mcg by mouth daily.      . Mometasone Furo-Formoterol Fum 200-5 MCG/ACT AERO Inhale 2 puffs into the lungs 2 (two) times daily.      . montelukast (SINGULAIR) 10 MG tablet Take 10 mg by mouth daily.       . Multiple Vitamin (MULTIVITAMIN) capsule Take 1 capsule by mouth daily.        . naproxen (NAPROSYN) 500 MG tablet Take 500 mg by mouth 2 (two) times daily as needed. For pain       .  Omeprazole-Sodium Bicarbonate (ZEGERID OTC PO) Take 1 tablet by mouth daily.      . predniSONE (DELTASONE) 10 MG tablet Take 40 mg x 3 days, 30 mg x 3 days, 20 mg x 3 days, 10 mg x 3 days then stop  30 tablet  0  . traMADol (ULTRAM) 50 MG tablet Take 50 mg by mouth every 6 (six) hours as needed.          Jailan Trimm Pager:  [phone] 09/27/2011, 4:35 PM

## 2011-09-27 NOTE — Assessment & Plan Note (Addendum)
Her recent CPAP download looked good, and her ONO with CPAP was normal.    She has more trouble with her asthma recently, and this may be contributing to some of her sleep difficulties.  Will re-assess in 4 months.  No change to current CPAP set up for now.  Advised her to d/w her DME about why her machine is blowing hot air.

## 2011-09-30 NOTE — Assessment & Plan Note (Signed)
You are having an asthma attack We will give you IM depot medrol 80mg x 1 shot now Take doxycycline 100mg po twice daily x 5 days; take after meals and avoid sunlight Please take starting tomorrow 09/22/11 prednisone 40mg once daily x 3 days, then 30mg once daily x 3 days, then 20mg once daily x 3 days, then prednisone 10mg once daily  x 3 days and stop  

## 2011-12-15 ENCOUNTER — Other Ambulatory Visit: Payer: Self-pay | Admitting: Internal Medicine

## 2012-01-16 DIAGNOSIS — M1710 Unilateral primary osteoarthritis, unspecified knee: Secondary | ICD-10-CM | POA: Diagnosis not present

## 2012-01-16 DIAGNOSIS — M25569 Pain in unspecified knee: Secondary | ICD-10-CM | POA: Diagnosis not present

## 2012-02-11 ENCOUNTER — Ambulatory Visit: Payer: Self-pay | Admitting: Internal Medicine

## 2012-02-12 ENCOUNTER — Ambulatory Visit (INDEPENDENT_AMBULATORY_CARE_PROVIDER_SITE_OTHER): Payer: Medicare Other | Admitting: Adult Health

## 2012-02-12 ENCOUNTER — Encounter: Payer: Self-pay | Admitting: Adult Health

## 2012-02-12 VITALS — BP 132/76 | HR 79 | Temp 98.3°F | Ht 66.0 in | Wt 380.8 lb

## 2012-02-12 DIAGNOSIS — J069 Acute upper respiratory infection, unspecified: Secondary | ICD-10-CM | POA: Diagnosis not present

## 2012-02-12 DIAGNOSIS — Z23 Encounter for immunization: Secondary | ICD-10-CM

## 2012-02-12 MED ORDER — AZITHROMYCIN 250 MG PO TABS: ORAL_TABLET | ORAL | Status: AC

## 2012-02-12 NOTE — Progress Notes (Signed)
Subjective:    Patient ID: Melissa Rowe, female    DOB: Jul 09, 1965, 46 y.o.   MRN: [id number]  HPI  46 yo AAF morbidly obese with known hx of Asthma, OSA on CPAP   Dyspnea due to Obesity and Asthma -> 05/05/2008: Spirometry suggests restriction. CXR 04/19/2008  Normal. CT 02/10/2008  - Neg for PE. No infiltrates -> Methacholine Challenge Test: 06/25/2008: Positive PC20 between 1-4  - 10/11/2009: fev1 2L/70% -> start pulmicort  -11/08/2009: fev1 2.24L/78% and better - > switch to QVAR due to cost - May 2012: fev1 2.32L/85% and normal but symptomactic -> change QVAR to dulera sample,  6day pred burst - Jul 13, 2010: Fev1 2.5L/94%, FVC 3.21/98% and improved -> continue dulera -10/26/2010 FEV1 2.33 L/87%, FVC 2.87 L/88% >cont on dulera  - med calendar 10/26/10    OV 10/26/2010  Presents for follow up and med review. Pt doing good since last ov. Breathing is better w/ less cough or dyspnea.  Today spirometry shows FEV1 at 87% We reviewed all her meds today and organized them into a med calendar. She appears to be taking them correctly.  Today -10/26/2010 FEV1 2.33 L/87%, FVC 2.87 L/88% >cont on dulera   OV 01/23/2011 Followup asthma. Last saw NP 3 months ago. Singulair was keeping her awake so now taking in morning. Cntinues MDI. Past 7-9 days increased sinus drainage, post nasal drip and feeling unwell initially with subjective fever. Drainage was brown=yellow. Clearing up now but still with colored drainage and having to cough it out. Above has resultted in increasd albuterol use to daily and nocturnal awakenings. Though not wheezing per se. Note: has had flu shot  04/03/11  Acute OV  Complains of Cough and congestion for 3 weeks with  non prod (occas yellow). Feels more SOB  And wheezing for last few day. Lots of coughing at night, drainage in throat.  No chest pain or increased edema. No hemoptysis.   02/12/2012 Acute OV  Complains of prod cough with clear mucus, head congestion, runny nose,  tightness/heaviness in chest, some wheezing/SOB x3 weeks .  Needs flu shot. No fever. Only mild wheezing, not bad.  OTC not helping.    Husband w/ failed Kidney transplant this month.    Review of Systems  Constitutional:   No  weight loss, night sweats,  Fevers, chills, + fatigue, or  lassitude.  HEENT:   No headaches,  Difficulty swallowing,  Tooth/dental problems, or  Sore throat,                No sneezing, itching, ear ache, + nasal congestion, post nasal drip,   CV:  No chest pain,  Orthopnea, PND,   anasarca, dizziness, palpitations, syncope.   GI  No heartburn, indigestion, abdominal pain, nausea, vomiting, diarrhea, change in bowel habits, loss of appetite, bloody stools.   Resp:    No coughing up of blood.   [name]  No chest wall deformity  Skin: no rash or lesions.  GU: no dysuria, change in color of urine, no urgency or frequency.  No flank pain, no hematuria   MS:  No joint pain or swelling.  No decreased range of motion.  No back pain.  Psych:  No change in mood or affect. No depression or anxiety.  No memory loss.         Objective:   Physical Exam  GEN: A/Ox3; pleasant , NAD, morbidly obese   HEENT:  [place]/AT,  EACs-clear, TMs-wnl, NOSE-clear, THROAT-clear, no lesions,  no postnasal drip or exudate noted.   NECK:  Supple w/ fair ROM; no JVD; normal carotid impulses w/o bruits; no thyromegaly or nodules palpated; no lymphadenopathy.  RESP  CTA no wheezing no accessory muscle use, no dullness to percussion  CARD:  RRR, no m/r/g  , tr- peripheral edema, pulses intact, no cyanosis or clubbing. Venous insufficiency changes   GI:   Soft & nt; nml bowel sounds; no organomegaly or masses detected.  Musco: Warm bil, no deformities or joint swelling noted.   Neuro: alert, no focal deficits noted.    Skin: Warm, no lesions or rashes         Assessment & Plan:

## 2012-02-12 NOTE — Patient Instructions (Addendum)
Zpack take as directed.  Mucinex DM Twice daily  As needed  Cough/congestion  Fluids and rest  Saline nasal rinses As needed   Follow up Dr. Marchelle Gearing in 1 month as planned    Please contact office for sooner follow up if symptoms do not improve or worsen or seek emergency care  Flu shot

## 2012-02-12 NOTE — Assessment & Plan Note (Signed)
Flare  No significant asthma flare, appears well controlled   Plan  Zpack take as directed.  Mucinex DM Twice daily  As needed  Cough/congestion  Fluids and rest  Saline nasal rinses As needed   Follow up Dr. Marchelle Gearing in 1 month as planned    Please contact office for sooner follow up if symptoms do not improve or worsen or seek emergency care  Flu shot

## 2012-02-27 ENCOUNTER — Other Ambulatory Visit (HOSPITAL_COMMUNITY): Payer: Self-pay | Admitting: Orthopaedic Surgery

## 2012-02-27 DIAGNOSIS — M1710 Unilateral primary osteoarthritis, unspecified knee: Secondary | ICD-10-CM | POA: Diagnosis not present

## 2012-02-28 ENCOUNTER — Other Ambulatory Visit (HOSPITAL_COMMUNITY): Payer: Self-pay | Admitting: Orthopaedic Surgery

## 2012-02-28 ENCOUNTER — Encounter: Payer: Self-pay | Admitting: Pulmonary Disease

## 2012-02-28 ENCOUNTER — Ambulatory Visit (INDEPENDENT_AMBULATORY_CARE_PROVIDER_SITE_OTHER): Payer: Medicare Other | Admitting: Pulmonary Disease

## 2012-02-28 VITALS — BP 118/74 | HR 97 | Temp 97.9°F | Ht 66.0 in | Wt 384.6 lb

## 2012-02-28 DIAGNOSIS — G4733 Obstructive sleep apnea (adult) (pediatric): Secondary | ICD-10-CM | POA: Diagnosis not present

## 2012-02-28 DIAGNOSIS — Z01811 Encounter for preprocedural respiratory examination: Secondary | ICD-10-CM | POA: Insufficient documentation

## 2012-02-28 NOTE — Progress Notes (Signed)
Chief Complaint  Patient presents with  . Follow-up    wears cpap everynight x 8-12 hrs a night. denies any problems w/ mask/machine. just receved new mask/tubing. not sleeping well at night and is having to nap during the day    History of Present Illness: Melissa Rowe is a 47 y.o. female with OSA.  She is being evaluated for knee surgery with Dr. Delynn Flavin.  She uses her CPAP every night.  She feels this helps.    She has trouble sleeping through the night due to back pain.  She feels sleepy at times during the day.   TESTS: PSG 08/29/08 >> REM AHI 11 CPAP 01/12/11 to 07/12/11 >> Used on 181 of 182 nights with average 10 hrs 53 min. Average AHI 2.3 with CPAP 12 cm H2O.  ONO with CPAP and RA 07/13/11 >> test time 9 hrs 13 min. Mean SpO2 96%, low SpO2 92%.   Past Medical History  Diagnosis Date  . Dyspnea   . Gastroenteritis   . Hyperlipidemia   . Diverticulitis   . Atrial mass   . OSA (obstructive sleep apnea)     Past Surgical History  Procedure Date  . Cholecystectomy 2008  . Polyps removal 2007  . Tubal ligation 1992    Outpatient Encounter Prescriptions as of 02/28/2012  Medication Sig Dispense Refill  . calcium carbonate (OS-CAL) 600 MG TABS Take 600 mg by mouth daily.       . chlorpheniramine (CHLOR-TRIMETON) 4 MG tablet Take 4 mg by mouth every 6 (six) hours as needed.       . Cholecalciferol (VITAMIN D3) 50000 UNITS CAPS ON HOLD      . cyclobenzaprine (FLEXERIL) 10 MG tablet Take 10 mg by mouth 2 (two) times daily as needed. For muscle spasms      . furosemide (LASIX) 20 MG tablet Take 20 mg by mouth 2 (two) times daily.      [name] HYDROcodone-acetaminophen (NORCO/VICODIN) 5-325 MG per tablet Take 1-2 tablets by mouth daily as needed.      [name] levothyroxine (SYNTHROID, LEVOTHROID) 25 MCG tablet Take 50 mcg by mouth daily.      . Mometasone Furo-Formoterol Fum 200-5 MCG/ACT AERO Inhale 2 puffs into the lungs 2 (two) times daily.      . montelukast (SINGULAIR)  10 MG tablet Take 10 mg by mouth daily.       . Multiple Vitamin (MULTIVITAMIN) capsule Take 1 capsule by mouth daily.        . naproxen (NAPROSYN) 500 MG tablet Take 500 mg by mouth 2 (two) times daily as needed. For pain       . Omeprazole-Sodium Bicarbonate (ZEGERID OTC PO) Take 1 tablet by mouth daily.      [name] PROVENTIL HFA 108 (90 BASE) MCG/ACT inhaler INHALE TWO PUFFS BY MOUTH EVERY 6 HOURS AS NEEDED  7 g  4  . tolterodine (DETROL LA) 2 MG 24 hr capsule Take 2 mg by mouth daily.      . traMADol (ULTRAM) 50 MG tablet Take 50 mg by mouth every 6 (six) hours as needed.          No Known Allergies  Physical Exam:  Filed Vitals:   02/28/12 0956 02/28/12 0957  BP:  118/74  Pulse:  97  Temp: 97.9 F (36.6 C)   TempSrc: Oral   Height: 5\' 6"  (1.676 m)   Weight: 384 lb 9.6 oz (174.453 kg)   SpO2:  99%  Current Encounter SPO2  02/28/12 0957 99%  02/12/12 1008 98%  09/27/11 1630 97%     Body mass index is 62.08 kg/(m^2).   Wt Readings from Last 2 Encounters:  02/28/12 384 lb 9.6 oz (174.453 kg)  02/12/12 380 lb 12.8 oz (172.73 kg)     General - No distress ENT - No sinus tenderness, no oral exudate, no LAN Cardiac - s1s2 regular, no murmur Chest - No wheeze/rales/dullness Back - No focal tenderness Abd - Soft, non-tender Ext - No edema Neuro - Normal strength Skin - No rashes Psych - normal mood, and behavior   Assessment/Plan:  Coralyn Helling, MD [place] [facility] Pager:  [phone] 02/28/2012, 10:23 AM

## 2012-02-28 NOTE — Assessment & Plan Note (Signed)
She is being evaluated for knee surgery.    Based on her sleep apnea there is no contra-indication for her to have surgery.  She can proceed with surgery if needed.  She should have close monitoring of her airway and oxygenation during the peri-operative period.  She should continue with CPAP after surgery.

## 2012-02-28 NOTE — Patient Instructions (Signed)
Follow-up in one year.

## 2012-02-28 NOTE — Assessment & Plan Note (Signed)
She reports compliance with CPAP and benefit from CPAP.  She has mild residual daytime sleepiness, but this is likely related to sleep disruption from back and arthritis pain.  Will defer addition of stimulant medications for now.

## 2012-03-10 ENCOUNTER — Encounter (HOSPITAL_COMMUNITY): Payer: Self-pay | Admitting: Pharmacy Technician

## 2012-03-12 NOTE — Pre-Procedure Instructions (Signed)
Melissa Rowe  03/12/2012   Your procedure is scheduled on:  Tuesday March 18, 2012  Report to [facility] at 1:30PM.  Call this number if you have problems the morning of surgery: [phone]   Remember:   Do not eat food or drink liquids after midnight.   Take these medicines the morning of surgery with A SIP OF WATER: Albuterol inhaler if needed for shortness of breath, Mometasone inhaler, Tramadol (Ultram) if needed for pain, Levothyroxine (Synthroid), Singulair, Omeprazole (Zegred), and Tolterodine (Detrol LA)   Do not wear jewelry, make-up or nail polish.  Do not wear lotions, powders, or perfumes.   Do not shave 48 hours prior to surgery.   Do not bring valuables to the hospital.  Contacts, dentures or bridgework may not be worn into surgery.  Leave suitcase in the car. After surgery it may be brought to your room.  For patients admitted to the hospital, checkout time is 11:00 AM the day of discharge.   Patients discharged the day of surgery will not be allowed to drive home.  Name and phone number of your driver: family / friend  Special Instructions: Shower using CHG 2 nights before surgery and the night before surgery.  If you shower the day of surgery use CHG.  Use special wash - you have one bottle of CHG for all showers.  You should use approximately 1/3 of the bottle for each shower.   Please read over the following fact sheets that you were given: Pain Booklet, Coughing and Deep Breathing, MRSA Information and Surgical Site Infection Prevention

## 2012-03-13 ENCOUNTER — Ambulatory Visit (HOSPITAL_COMMUNITY)
Admission: RE | Admit: 2012-03-13 | Discharge: 2012-03-13 | Disposition: A | Discharge status: Home / Self Care | Payer: Medicare Other | Source: Ambulatory Visit | Attending: Anesthesiology | Admitting: Anesthesiology

## 2012-03-13 ENCOUNTER — Encounter (HOSPITAL_COMMUNITY)
Admission: RE | Admit: 2012-03-13 | Discharge: 2012-03-13 | Disposition: A | Discharge status: Home / Self Care | Payer: Medicare Other | Source: Ambulatory Visit | Attending: Orthopaedic Surgery | Admitting: Orthopaedic Surgery

## 2012-03-13 ENCOUNTER — Encounter (HOSPITAL_COMMUNITY): Payer: Self-pay

## 2012-03-13 DIAGNOSIS — Z01810 Encounter for preprocedural cardiovascular examination: Secondary | ICD-10-CM | POA: Diagnosis not present

## 2012-03-13 DIAGNOSIS — Z01812 Encounter for preprocedural laboratory examination: Secondary | ICD-10-CM | POA: Diagnosis not present

## 2012-03-13 DIAGNOSIS — R0602 Shortness of breath: Secondary | ICD-10-CM | POA: Diagnosis not present

## 2012-03-13 DIAGNOSIS — Z01818 Encounter for other preprocedural examination: Secondary | ICD-10-CM | POA: Diagnosis not present

## 2012-03-13 HISTORY — DX: Frequency of micturition: R35.0

## 2012-03-13 HISTORY — DX: Unspecified osteoarthritis, unspecified site: M19.90

## 2012-03-13 HISTORY — DX: Gastro-esophageal reflux disease without esophagitis: K21.9

## 2012-03-13 HISTORY — DX: Other allergy status, other than to drugs and biological substances: Z91.09

## 2012-03-13 HISTORY — DX: Hypothyroidism, unspecified: E03.9

## 2012-03-13 HISTORY — DX: Urgency of urination: R39.15

## 2012-03-13 HISTORY — DX: Unspecified asthma, uncomplicated: J45.909

## 2012-03-13 LAB — CBC
MCH: 21.3 pg — ABNORMAL LOW (ref 26.0–34.0)
MCHC: 32.6 g/dL (ref 30.0–36.0)
RDW: 16.8 % — ABNORMAL HIGH (ref 11.5–15.5)

## 2012-03-13 LAB — BASIC METABOLIC PANEL
Calcium: 9.5 mg/dL (ref 8.4–10.5)
GFR calc Af Amer: >90 mL/min (ref >90)
GFR calc non Af Amer: >90 mL/min (ref >90)
Glucose, Bld: 105 mg/dL — ABNORMAL HIGH (ref 70–99)
Potassium: 4.2 mEq/L (ref 3.5–5.1)
Sodium: 138 mEq/L (ref 135–145)

## 2012-03-13 LAB — SURGICAL PCR SCREEN
MRSA, PCR: NEGATIVE
Staphylococcus aureus: NEGATIVE

## 2012-03-13 NOTE — Progress Notes (Signed)
Patient denied having a stress test, or cardiac cath, but informed Nurse that she had sleep apnea and wears a CPAP during naps and at bedtime. Patient instructed to bring mask the DOS. Patient verbalized understanding. Patient has a new PCP; Dr. Clelia Croft at [facility] however her appointment is not until April 2014. Dr. Craige Cotta sees patient for sleep apnea, and Dr. Marchelle Gearing is pulmonologist . Patient has an appointment with Dr. Marchelle Gearing tomorrow.

## 2012-03-14 ENCOUNTER — Ambulatory Visit (INDEPENDENT_AMBULATORY_CARE_PROVIDER_SITE_OTHER): Payer: Medicare Other | Admitting: Internal Medicine

## 2012-03-14 ENCOUNTER — Encounter: Payer: Self-pay | Admitting: Internal Medicine

## 2012-03-14 VITALS — BP 120/78 | HR 96 | Temp 98.1°F | Ht 66.0 in | Wt 390.2 lb

## 2012-03-14 DIAGNOSIS — J45909 Unspecified asthma, uncomplicated: Secondary | ICD-10-CM | POA: Diagnosis not present

## 2012-03-14 DIAGNOSIS — Z01811 Encounter for preprocedural respiratory examination: Secondary | ICD-10-CM | POA: Diagnosis not present

## 2012-03-14 DIAGNOSIS — J4540 Moderate persistent asthma, uncomplicated: Secondary | ICD-10-CM

## 2012-03-14 MED ORDER — FUROSEMIDE 20 MG PO TABS: 20.0000 mg | ORAL_TABLET | Freq: Two times a day (BID) | ORAL | Status: DC

## 2012-03-14 NOTE — Progress Notes (Signed)
Subjective:    Patient ID: Randalyn Rhea, female    DOB: February 08, 1966, 47 y.o.   MRN: [id number]  HPI 47 yo AAF morbidly obese with known hx of Asthma, OSA on CPAP   Dyspnea due to Obesity and Asthma -> 05/05/2008: Spirometry suggests restriction. CXR 04/19/2008  Normal. CT 02/10/2008  - Neg for PE. No infiltrates -> Methacholine Challenge Test: 06/25/2008: Positive PC20 between 1-4  - 10/11/2009: fev1 2L/70% -> start pulmicort  -11/08/2009: fev1 2.24L/78% and better - > switch to QVAR due to cost - May 2012: fev1 2.32L/85% and normal but symptomactic -> change QVAR to dulera sample,  6day pred burst - Jul 13, 2010: Fev1 2.5L/94%, FVC 3.21/98% and improved -> continue dulera -10/26/2010 FEV1 2.33 L/87%, FVC 2.87 L/88% >cont on dulera  - med calendar 10/26/10    OV 10/26/2010  Presents for follow up and med review. Pt doing good since last ov. Breathing is better w/ less cough or dyspnea.  Today spirometry shows FEV1 at 87% We reviewed all her meds today and organized them into a med calendar. She appears to be taking them correctly.  Today -10/26/2010 FEV1 2.33 L/87%, FVC 2.87 L/88% >cont on dulera   OV 01/23/2011 Followup asthma. Last saw NP 3 months ago. Singulair was keeping her awake so now taking in morning. Cntinues MDI. Past 7-9 days increased sinus drainage, post nasal drip and feeling unwell initially with subjective fever. Drainage was brown=yellow. Clearing up now but still with colored drainage and having to cough it out. Above has resultted in increasd albuterol use to daily and nocturnal awakenings. Though not wheezing per se. Note: has had flu shot  04/03/11  Acute OV  Complains of Cough and congestion for 3 weeks with  non prod (occas yellow). Feels more SOB  And wheezing for last few day. Lots of coughing at night, drainage in throat.  No chest pain or increased edema. No hemoptysis.   02/12/2012 Acute OV  Complains of prod cough with clear mucus, head congestion, runny nose,  tightness/heaviness in chest, some wheezing/SOB x3 weeks .  Needs flu shot. No fever. Only mild wheezing, not bad.  OTC not helping.    Husband w/ failed Kidney transplant this month.  Zpack take as directed.  Mucinex DM Twice daily As needed Cough/congestion  Fluids and rest  Saline nasal rinses As needed  Follow up Dr. Marchelle Gearing in 1 month as planned  Please contact office for sooner follow up if symptoms do not improve or worsen or seek emergency care  Flu shot    OV 03/14/2012  Followup asthma. In terms of asthma she is doing well. She continues on Singulair and  Dulera. The no interim problems.  New issue is that she is having right knee surgery due to osteoarthritis. This is by Dr. Magnus Ivan. She has been told that obesity is causing her any problems and osteoarthritis she is keen on losing weight. In talking to her she eats a lot of fruits and occasional breads and some red meat and ice cream cookies This seems to be the main problem for persistent high weight. She is pretty good about vegetables.   Review of Systems  Constitutional: Negative for fever and unexpected weight change.  HENT: Positive for postnasal drip. Negative for ear pain, nosebleeds, congestion, sore throat, rhinorrhea, sneezing, trouble swallowing, dental problem and sinus pressure.   Eyes: Negative for redness and itching.  Respiratory: Negative for cough, chest tightness, shortness of breath and wheezing.  Cardiovascular: Negative for palpitations and leg swelling.  Gastrointestinal: Negative for nausea and vomiting.  Genitourinary: Negative for dysuria.  Musculoskeletal: Negative for joint swelling.  Skin: Negative for rash.  Neurological: Negative for headaches.  Hematological: Does not bruise/bleed easily.  Psychiatric/Behavioral: Negative for dysphoric mood. The patient is not nervous/anxious.   Past, Family, Social reviewed: no change since last visit other than history of present illness       Objective:   Physical Exam GEN: A/Ox3; pleasant , NAD, morbidly obese   HEENT:  [place]/AT,  EACs-clear, TMs-wnl, NOSE-clear, THROAT-clear, no lesions, no postnasal drip or exudate noted.   NECK:  Supple w/ fair ROM; no JVD; normal carotid impulses w/o bruits; no thyromegaly or nodules palpated; no lymphadenopathy.  RESP  CTA no wheezing no accessory muscle use, no dullness to percussion  CARD:  RRR, no m/r/g  , tr- peripheral edema, pulses intact, no cyanosis or clubbing. Venous insufficiency changes   GI:   Soft & nt; nml bowel sounds; no organomegaly or masses detected.  Musco: Warm bil, no deformities or joint swelling noted.   Neuro: alert, no focal deficits noted.    Skin: Warm, no lesions or rashes          Assessment & Plan:

## 2012-03-14 NOTE — Patient Instructions (Addendum)
#  Asthma - Glad this is stable - Continue your asthma inhalers as before  #Preoperative pulmonary evaluation for knee surgery - Low risk for pulmonary problems but keep in mind that the main risk in terms of lung health is developing blood clot in the leg And this spreading to the lung he did.Te orthopedic doctors are excellent about preventing this with preventative medications    #Folllwup  - fix special appt to discuss weight mgmt in < 3 months if interested  - fu asthma 6 months

## 2012-03-14 NOTE — Assessment & Plan Note (Signed)
#  Weight management - You absolutely need to skip fruit juices, bread,pasta, and  - Limit fruits to those in left lane to 1-2 servings per da - Eat TONS of fresh vegetable but avoid canned food  - Only lean meat - Drink lot of water - Eat daily nuts from left lane - Make a special followup visit to discuss this in further

## 2012-03-14 NOTE — Assessment & Plan Note (Signed)
Currently asthma stable. Have asked her to continue [facility] and Singulair

## 2012-03-14 NOTE — Assessment & Plan Note (Signed)
She is low risk for pulmonary complications from knee surgery except for issues with deep vein thrombosis and pulmonary embolism. However orthopedics is excellent about prophylactic treatment about this. I did highlight this fact her. I have encouraged her to go ahead with the surgery and followed standard prophylaxis per orthopedic surgery. In the long run she will need to lose weight to minimize the risk factors for thrombolic disease

## 2012-03-17 MED ORDER — DEXTROSE 5 % IV SOLN
3.0000 g | INTRAVENOUS | Status: DC
  Filled 2012-03-17: qty 3000

## 2012-03-18 ENCOUNTER — Encounter (HOSPITAL_COMMUNITY)
Admission: RE | Disposition: A | Discharge status: Home / Self Care | Payer: Self-pay | Source: Ambulatory Visit | Attending: Orthopaedic Surgery

## 2012-03-18 ENCOUNTER — Encounter (HOSPITAL_COMMUNITY): Payer: Self-pay | Admitting: *Deleted

## 2012-03-18 ENCOUNTER — Ambulatory Visit (HOSPITAL_COMMUNITY): Payer: Medicare Other | Admitting: *Deleted

## 2012-03-18 ENCOUNTER — Ambulatory Visit (HOSPITAL_COMMUNITY)
Admission: RE | Admit: 2012-03-18 | Discharge: 2012-03-18 | Disposition: A | Discharge status: Home / Self Care | Payer: Medicare Other | Source: Ambulatory Visit | Attending: Orthopaedic Surgery | Admitting: Orthopaedic Surgery

## 2012-03-18 DIAGNOSIS — Z9089 Acquired absence of other organs: Secondary | ICD-10-CM | POA: Diagnosis not present

## 2012-03-18 DIAGNOSIS — M23305 Other meniscus derangements, unspecified medial meniscus, unspecified knee: Secondary | ICD-10-CM | POA: Insufficient documentation

## 2012-03-18 DIAGNOSIS — M1710 Unilateral primary osteoarthritis, unspecified knee: Secondary | ICD-10-CM | POA: Diagnosis not present

## 2012-03-18 DIAGNOSIS — R350 Frequency of micturition: Secondary | ICD-10-CM | POA: Diagnosis not present

## 2012-03-18 DIAGNOSIS — J45909 Unspecified asthma, uncomplicated: Secondary | ICD-10-CM | POA: Insufficient documentation

## 2012-03-18 DIAGNOSIS — M1711 Unilateral primary osteoarthritis, right knee: Secondary | ICD-10-CM

## 2012-03-18 DIAGNOSIS — M25569 Pain in unspecified knee: Secondary | ICD-10-CM | POA: Diagnosis not present

## 2012-03-18 DIAGNOSIS — E039 Hypothyroidism, unspecified: Secondary | ICD-10-CM | POA: Insufficient documentation

## 2012-03-18 DIAGNOSIS — G4733 Obstructive sleep apnea (adult) (pediatric): Secondary | ICD-10-CM | POA: Insufficient documentation

## 2012-03-18 DIAGNOSIS — E785 Hyperlipidemia, unspecified: Secondary | ICD-10-CM | POA: Insufficient documentation

## 2012-03-18 DIAGNOSIS — M659 Synovitis and tenosynovitis, unspecified: Secondary | ICD-10-CM | POA: Diagnosis not present

## 2012-03-18 DIAGNOSIS — M899 Disorder of bone, unspecified: Secondary | ICD-10-CM | POA: Diagnosis not present

## 2012-03-18 DIAGNOSIS — K219 Gastro-esophageal reflux disease without esophagitis: Secondary | ICD-10-CM | POA: Diagnosis not present

## 2012-03-18 HISTORY — PX: KNEE ARTHROSCOPY: SHX127

## 2012-03-18 SURGERY — ARTHROSCOPY, KNEE
Anesthesia: General | Laterality: Right

## 2012-03-18 MED ORDER — LIDOCAINE HCL (CARDIAC) 20 MG/ML IV SOLN
INTRAVENOUS | Status: DC | PRN
  Administered 2012-03-18: 50 mg via INTRAVENOUS

## 2012-03-18 MED ORDER — MORPHINE SULFATE 10 MG/ML IJ SOLN
INTRAMUSCULAR | Status: DC | PRN
  Administered 2012-03-18: 2 mg via INTRAVENOUS

## 2012-03-18 MED ORDER — LACTATED RINGERS IV SOLN
INTRAVENOUS | Status: DC | PRN
  Administered 2012-03-18: 15:00:00 via INTRAVENOUS

## 2012-03-18 MED ORDER — MORPHINE SULFATE 4 MG/ML IJ SOLN
INTRAMUSCULAR | Status: AC
  Filled 2012-03-18: qty 1

## 2012-03-18 MED ORDER — FENTANYL CITRATE 0.05 MG/ML IJ SOLN
INTRAMUSCULAR | Status: AC
  Filled 2012-03-18: qty 2

## 2012-03-18 MED ORDER — ONDANSETRON HCL 4 MG/2ML IJ SOLN: 4.0000 mg | Freq: Four times a day (QID) | INTRAMUSCULAR | Status: DC | PRN

## 2012-03-18 MED ORDER — LACTATED RINGERS IV SOLN
INTRAVENOUS | Status: DC
  Administered 2012-03-18: 15:00:00 via INTRAVENOUS

## 2012-03-18 MED ORDER — PROPOFOL 10 MG/ML IV BOLUS
INTRAVENOUS | Status: DC | PRN
  Administered 2012-03-18: 30 mg via INTRAVENOUS
  Administered 2012-03-18: 200 mg via INTRAVENOUS

## 2012-03-18 MED ORDER — SODIUM CHLORIDE 0.9 % IR SOLN
Status: DC | PRN
  Administered 2012-03-18: 3000 mL

## 2012-03-18 MED ORDER — MORPHINE SULFATE 4 MG/ML IJ SOLN
INTRAMUSCULAR | Status: DC | PRN
  Administered 2012-03-18: 4 mg via SUBCUTANEOUS

## 2012-03-18 MED ORDER — OXYCODONE HCL 5 MG/5ML PO SOLN: 5.0000 mg | Freq: Once | ORAL | Status: AC | PRN

## 2012-03-18 MED ORDER — FENTANYL CITRATE 0.05 MG/ML IJ SOLN
INTRAMUSCULAR | Status: DC | PRN
  Administered 2012-03-18: 100 ug via INTRAVENOUS
  Administered 2012-03-18: 50 ug via INTRAVENOUS
  Administered 2012-03-18: 100 ug via INTRAVENOUS

## 2012-03-18 MED ORDER — ONDANSETRON HCL 4 MG/2ML IJ SOLN
INTRAMUSCULAR | Status: DC | PRN
  Administered 2012-03-18: 4 mg via INTRAVENOUS

## 2012-03-18 MED ORDER — FENTANYL CITRATE 0.05 MG/ML IJ SOLN
25.0000 ug | INTRAMUSCULAR | Status: DC | PRN
  Administered 2012-03-18 (×2): 25 ug via INTRAVENOUS

## 2012-03-18 MED ORDER — OXYCODONE-ACETAMINOPHEN 5-325 MG PO TABS: 1.0000 | ORAL_TABLET | ORAL | Status: DC | PRN

## 2012-03-18 MED ORDER — OXYCODONE HCL 5 MG PO TABS
5.0000 mg | ORAL_TABLET | Freq: Once | ORAL | Status: AC | PRN
Start: 2012-03-18 — End: 2012-03-18
  Filled 2012-03-18: qty 2

## 2012-03-18 MED ORDER — DEXAMETHASONE SODIUM PHOSPHATE 4 MG/ML IJ SOLN
INTRAMUSCULAR | Status: DC | PRN
  Administered 2012-03-18: 4 mg via INTRAVENOUS

## 2012-03-18 MED ORDER — ASPIRIN EC 325 MG PO TBEC: 325.0000 mg | DELAYED_RELEASE_TABLET | Freq: Two times a day (BID) | ORAL | Status: DC

## 2012-03-18 MED ORDER — SUCCINYLCHOLINE CHLORIDE 20 MG/ML IJ SOLN
INTRAMUSCULAR | Status: DC | PRN
  Administered 2012-03-18: 100 mg via INTRAVENOUS

## 2012-03-18 MED ORDER — OXYCODONE HCL 5 MG PO TABS
10.0000 mg | ORAL_TABLET | Freq: Once | ORAL | Status: AC
  Administered 2012-03-18: 10 mg via ORAL

## 2012-03-18 MED ORDER — BUPIVACAINE HCL (PF) 0.5 % IJ SOLN
INTRAMUSCULAR | Status: AC
  Filled 2012-03-18: qty 30

## 2012-03-18 MED ORDER — MIDAZOLAM HCL 5 MG/5ML IJ SOLN
INTRAMUSCULAR | Status: DC | PRN
  Administered 2012-03-18 (×2): 1 mg via INTRAVENOUS

## 2012-03-18 MED ORDER — BUPIVACAINE-EPINEPHRINE 0.5% -1:200000 IJ SOLN
INTRAMUSCULAR | Status: DC | PRN
  Administered 2012-03-18: 30 mL

## 2012-03-18 MED ORDER — ALBUTEROL SULFATE HFA 108 (90 BASE) MCG/ACT IN AERS
INHALATION_SPRAY | RESPIRATORY_TRACT | Status: DC | PRN
  Administered 2012-03-18 (×2): 2 via RESPIRATORY_TRACT

## 2012-03-18 SURGICAL SUPPLY — 38 items
BANDAGE ELASTIC 6 VELCRO ST LF (GAUZE/BANDAGES/DRESSINGS) ×2 IMPLANT
BANDAGE ESMARK 6X9 LF (GAUZE/BANDAGES/DRESSINGS) IMPLANT
BLADE CUDA 5.5 (BLADE) ×2 IMPLANT
BLADE CUTTER GATOR 3.5 (BLADE) ×2 IMPLANT
BLADE SURG 11 STRL SS (BLADE) ×2 IMPLANT
BLADE SURG ROTATE 9660 (MISCELLANEOUS) IMPLANT
BNDG ESMARK 6X9 LF (GAUZE/BANDAGES/DRESSINGS)
BUR OVAL 6.0 (BURR) IMPLANT
CLOTH BEACON ORANGE TIMEOUT ST (SAFETY) ×2 IMPLANT
COVER SURGICAL LIGHT HANDLE (MISCELLANEOUS) ×2 IMPLANT
CUFF TOURNIQUET SINGLE 34IN LL (TOURNIQUET CUFF) IMPLANT
CUFF TOURNIQUET SINGLE 44IN (TOURNIQUET CUFF) IMPLANT
DRAPE ARTHROSCOPY W/POUCH 114 (DRAPES) ×2 IMPLANT
DRAPE U-SHAPE 47X51 STRL (DRAPES) ×2 IMPLANT
DRSG PAD ABDOMINAL 8X10 ST (GAUZE/BANDAGES/DRESSINGS) ×2 IMPLANT
DURAPREP 26ML APPLICATOR (WOUND CARE) ×2 IMPLANT
GAUZE XEROFORM 1X8 LF (GAUZE/BANDAGES/DRESSINGS) ×2 IMPLANT
GLOVE BIOGEL PI IND STRL 8 (GLOVE) ×1 IMPLANT
GLOVE BIOGEL PI INDICATOR 8 (GLOVE) ×1
GLOVE ORTHO TXT STRL SZ7.5 (GLOVE) ×2 IMPLANT
GOWN PREVENTION PLUS LG XLONG (DISPOSABLE) ×2 IMPLANT
GOWN PREVENTION PLUS XLARGE (GOWN DISPOSABLE) ×2 IMPLANT
GOWN STRL NON-REIN LRG LVL3 (GOWN DISPOSABLE) ×4 IMPLANT
KIT ROOM TURNOVER OR (KITS) ×2 IMPLANT
MANIFOLD NEPTUNE II (INSTRUMENTS) ×2 IMPLANT
NS IRRIG 1000ML POUR BTL (IV SOLUTION) IMPLANT
PACK ARTHROSCOPY DSU (CUSTOM PROCEDURE TRAY) ×2 IMPLANT
PAD ARMBOARD 7.5X6 YLW CONV (MISCELLANEOUS) ×4 IMPLANT
PADDING CAST COTTON 6X4 STRL (CAST SUPPLIES) ×2 IMPLANT
SET ARTHROSCOPY TUBING (MISCELLANEOUS) ×1
SET ARTHROSCOPY TUBING LN (MISCELLANEOUS) ×1 IMPLANT
SPONGE GAUZE 4X4 12PLY (GAUZE/BANDAGES/DRESSINGS) ×2 IMPLANT
SPONGE LAP 4X18 X RAY DECT (DISPOSABLE) ×2 IMPLANT
SUT ETHILON 3 0 PS 1 (SUTURE) ×2 IMPLANT
TOWEL OR 17X24 6PK STRL BLUE (TOWEL DISPOSABLE) ×2 IMPLANT
TOWEL OR 17X26 10 PK STRL BLUE (TOWEL DISPOSABLE) ×2 IMPLANT
WAND 90 DEG TURBOVAC W/CORD (SURGICAL WAND) IMPLANT
WATER STERILE IRR 1000ML POUR (IV SOLUTION) ×2 IMPLANT

## 2012-03-18 NOTE — Op Note (Signed)
Melissa Rowe, Melissa Rowe NO.:  [id number]  MEDICAL RECORD NO.:  [id number]  LOCATION:  [facility]                         FACILITY:  [facility]  PHYSICIAN:  Vanita Panda. Magnus Ivan, M.D.DATE OF BIRTH:  02/07/1966  DATE OF PROCEDURE:  03/18/2012 DATE OF DISCHARGE:                              OPERATIVE REPORT   PREOPERATIVE DIAGNOSIS:  Right knee pain with tricompartmental arthritic changes.  POSTOPERATIVE DIAGNOSIS:  Right knee tricompartmental arthritis with full-thickness cartilage loss, medial compartment, lateral compartment and patellofemoral joint as well as degenerative medial meniscal tearing.  PROCEDURE:  Right knee arthroscopy with debridement and chondroplasty of all three compartments.  SURGEON:  Vanita Panda. Magnus Ivan, M.D.  ASSISTANT:  Wende Neighbors, PA-C  ANESTHESIA: 1. General. 2. Local with 0.5% plain Sensorcaine mixed with 4 mg of morphine.  BLOOD LOSS:  Minimal.  COMPLICATIONS:  None.  INDICATIONS:  Melissa Rowe is a morbidly obese 47 year old who is in excess of 390 pounds.  She has stress changes on her right knee on x- ray, mild varus deformity and evidence of Stryker bone arthritis.  She is not a candidate right now for the total knee arthroplasty.  We tried injections, anti-inflammatories, rest, time, and she has been unable to lose weight.  She does wish for an arthroscopic intervention of the hopes that this could help ease her pain and help for her to be able to get more mobile and then lose weight.  Risks and benefits of this were explained to her in detail and she does wish to proceed with surgery.  PROCEDURE DESCRIPTION:  After informed consent was obtained, appropriate right knee was marked.  She was brought to the operating room, placed supine on the operating table.  General anesthesia was then obtained.  A lateral leg post was utilized and the right leg was prepped and draped from the thigh down the ankle with DuraPrep and  sterile drapes including a sterile stockinette.  A time-out was called and she was identified as correct patient, and the correct right knee.  We then flexed the knee offside of the table in its lateral leg post and the bed was raised.  I made an anterolateral arthroscopy portal and surgery cannulated the knee and found a large effusion had drained from the knee.  I then went directly to the medial compartment and made a anteromedial arthroscopy portal.  Right away, I could see there was areas of full-thickness cartilage loss on the medial femoral condyle mainly on the medial tibial plateau and degenerative tears of the meniscus from the mid posterior aspect to the mid body.  Using arthroscopic shaver, I debrided this back to a stable margin including up cutting biters.  I then removed cartilage remnants that were hanging down from the medial femoral condyle and the plateau.  I went to the middle of the knee and the ACL was intact with the knee in a figure 4 position, the lateral compartment was assessed and found to have full thickness cartilage loss on the lateral femoral condyle, and some on the lateral tibial plateau. Finally, the patellofemoral joint was assessed and found areas of loose cartilage as well.  All 3 compartments I performed a partial  chondroplasty with arthroscopic shaver.  I then allowed fluid to lavage to the knee and I drained all the fluid from the knee.  I closed the portal sites with interrupted nylon suture.  I inserted a mixture of morphine and Marcaine into the knee and the portal sites well-padded. Sterile dressing was applied.  She was awakened, extubated, and taken to recovery room in stable condition.  All final counts were correct. There were no complications noted.  Of note, Melissa Deed, PA-C was assisted during the case mainly due to the patient's morbid obesity, so I gained control of the leg safely to perform the surgery.     Vanita Panda.  Magnus Ivan, M.D.     CYB/MEDQ  D:  03/18/2012  T:  03/18/2012  Job:  [id number]

## 2012-03-18 NOTE — Anesthesia Preprocedure Evaluation (Signed)
Anesthesia Evaluation  Patient identified by MRN, date of birth, ID band Patient awake    Reviewed: Allergy & Precautions, H&P , NPO status , Patient's Chart, lab work & pertinent test results  Airway Mallampati: II  Neck ROM: full    Dental   Pulmonary shortness of breath, asthma , sleep apnea , former smoker,          Cardiovascular     Neuro/Psych    GI/Hepatic GERD-  ,  Endo/Other  Hypothyroidism Morbid obesity  Renal/GU      Musculoskeletal  (+) Arthritis -,   Abdominal   Peds  Hematology   Anesthesia Other Findings   Reproductive/Obstetrics                           Anesthesia Physical Anesthesia Plan  ASA: III  Anesthesia Plan: General   Post-op Pain Management:    Induction: Intravenous  Airway Management Planned: Oral ETT  Additional Equipment:   Intra-op Plan:   Post-operative Plan: Extubation in OR  Informed Consent: I have reviewed the patients History and Physical, chart, labs and discussed the procedure including the risks, benefits and alternatives for the proposed anesthesia with the patient or authorized representative who has indicated his/her understanding and acceptance.     Plan Discussed with: CRNA and Surgeon  Anesthesia Plan Comments:         Anesthesia Quick Evaluation

## 2012-03-18 NOTE — Brief Op Note (Signed)
03/18/2012  4:34 PM  PATIENT:  Melissa Rowe  47 y.o. female  PRE-OPERATIVE DIAGNOSIS:  right knee synovitis, osteoarthritis  POST-OPERATIVE DIAGNOSIS:  right knee synovitis, osteoarthritis  PROCEDURE:  Procedure(s) (LRB) with comments: ARTHROSCOPY KNEE (Right) - Right knee arthroscopy with debridement and three compartment chondroplasty  SURGEON:  Surgeon(s) and Role:    * Kathryne Hitch, MD - Primary  PHYSICIAN ASSISTANT:   ASSISTANTS: [name], PA-C   ANESTHESIA:   local and general  EBL:     BLOOD ADMINISTERED:none  DRAINS: none   LOCAL MEDICATIONS USED:  MARCAINE     SPECIMEN:  No Specimen  DISPOSITION OF SPECIMEN:  N/A  COUNTS:  YES  TOURNIQUET:  * Missing tourniquet times found for documented tourniquets in log:  80012 *  DICTATION: .Other Dictation: Dictation Number [id number]  PLAN OF CARE: Discharge to home after PACU  PATIENT DISPOSITION:  PACU - hemodynamically stable.   Delay start of Pharmacological VTE agent (>24hrs) due to surgical blood loss or risk of bleeding: not applicable

## 2012-03-18 NOTE — Anesthesia Postprocedure Evaluation (Signed)
  Anesthesia Post-op Note  Patient: Melissa Rowe  Procedure(s) Performed: Procedure(s) (LRB) with comments: ARTHROSCOPY KNEE (Right) - Right knee arthroscopy with debridement and three compartment chondroplasty  Patient Location: PACU  Anesthesia Type:General  Level of Consciousness: awake and alert   Airway and Oxygen Therapy: Patient Spontanous Breathing  Post-op Pain: moderate  Post-op Assessment: Post-op Vital signs reviewed  Post-op Vital Signs: stable  Complications: No apparent anesthesia complications

## 2012-03-18 NOTE — Transfer of Care (Signed)
Immediate Anesthesia Transfer of Care Note  Patient: Melissa Rowe  Procedure(s) Performed: Procedure(s) (LRB) with comments: ARTHROSCOPY KNEE (Right) - Right knee arthroscopy with debridement and three compartment chondroplasty  Patient Location: PACU  Anesthesia Type:General  Level of Consciousness: awake, oriented and patient cooperative  Airway & Oxygen Therapy: Patient Spontanous Breathing and Patient connected to face mask oxygen  Post-op Assessment: Report given to PACU RN and Post -op Vital signs reviewed and stable  Post vital signs: Reviewed and stable  Complications: No apparent anesthesia complications

## 2012-03-18 NOTE — Progress Notes (Signed)
DR Ophelia Charter NOTIFIED OF C/O 6/10 PAIN RIGHT KNEE AND ORDER NOTED AND MED GIVEN

## 2012-03-18 NOTE — Progress Notes (Signed)
Care of pt assumed by MA Ajeet Casasola RN 

## 2012-03-18 NOTE — H&P (Signed)
Melissa Rowe is an 47 y.o. female.   Chief Complaint:   Right knee pain with locking/catching HPI:   47 yo morbidly obese female (380+ lbs) with right knee pain that has had locking and catching.  We have tried NSAID's, rest and steroid injections.  Her weight is definitely a factor.  Conservative treatment has not worked and she has not been able to loose weight.  She also can not fit in a MRI.  At this point, her x-rays show significant medial compartment OA.  Likely, she has chronic meniscal tearing as well.  She is not a candidate for total knee replacement given her young age combined with her weight.  We will proceed with a right knee arthroscopy with the goal for helping her pain and increasing her mobility.  Past Medical History  Diagnosis Date  . Dyspnea   . Gastroenteritis   . Hyperlipidemia   . Diverticulitis   . Atrial mass   . OSA (obstructive sleep apnea)     wears CPAP night  . Hypothyroidism   . Asthma   . Urgency of urination   . Frequency of urination   . GERD (gastroesophageal reflux disease)   . Arthritis   . Environmental allergies     Past Surgical History  Procedure Date  . Cholecystectomy 2008  . Polyps removal 2007  . Tubal ligation 1992  . Carpal tunnel release     right  . Colonoscopy     Family History  Problem Relation Age of Onset  . Heart murmur Mother     rheumatoid arthritis  . Asthma Brother     and mother both as a child   Social History:  reports that she quit smoking about 22 years ago. Her smoking use included Cigarettes. She has a 14 pack-year smoking history. She does not have any smokeless tobacco history on file. She reports that she drinks alcohol. She reports that she does not use illicit drugs.  Allergies: No Known Allergies  No prescriptions prior to admission    No results found for this or any previous visit (from the past 48 hour(s)). No results found.  Review of Systems  Musculoskeletal: Positive for back pain and  joint pain.  All other systems reviewed and are negative.    There were no vitals taken for this visit. Physical Exam  Constitutional: She is oriented to person, place, and time. She appears well-developed and well-nourished.  HENT:  Head: Normocephalic and atraumatic.  Eyes: Pupils are equal, round, and reactive to light.  Neck: Normal range of motion. Neck supple.  Cardiovascular: Normal rate and regular rhythm.   Respiratory: Effort normal and breath sounds normal.  GI: Soft. Bowel sounds are normal.  Musculoskeletal:       Right knee: She exhibits effusion and abnormal alignment. tenderness found. Medial joint line tenderness noted.  Neurological: She is alert and oriented to person, place, and time.  Skin: Skin is warm and dry.  Psychiatric: She has a normal mood and affect.     Assessment/Plan Right knee pain in a morbidly obese young person with likely chronic meniscal tearing 1) to the OR for a right knee arthroscopy with debridement and chondroplasty  Rayfield Beem Y 03/18/2012, 12:42 PM

## 2012-03-19 ENCOUNTER — Encounter (HOSPITAL_COMMUNITY): Payer: Self-pay | Admitting: Orthopaedic Surgery

## 2012-04-10 ENCOUNTER — Ambulatory Visit (INDEPENDENT_AMBULATORY_CARE_PROVIDER_SITE_OTHER): Payer: Medicare Other | Admitting: Adult Health

## 2012-04-10 ENCOUNTER — Encounter: Payer: Self-pay | Admitting: Adult Health

## 2012-04-10 VITALS — BP 122/72 | HR 95 | Temp 98.4°F | Ht 66.0 in | Wt 382.4 lb

## 2012-04-10 DIAGNOSIS — J069 Acute upper respiratory infection, unspecified: Secondary | ICD-10-CM

## 2012-04-10 MED ORDER — HYDROCODONE-HOMATROPINE 5-1.5 MG/5ML PO SYRP: 5.0000 mL | ORAL_SOLUTION | Freq: Four times a day (QID) | ORAL | Status: DC | PRN

## 2012-04-10 MED ORDER — CEFDINIR 300 MG PO CAPS: 300.0000 mg | ORAL_CAPSULE | Freq: Two times a day (BID) | ORAL | Status: DC

## 2012-04-10 NOTE — Assessment & Plan Note (Signed)
Flare   Plan  Omnicef 300mg  Twice daily  For 7 days  Mucinex DM Twice daily  As needed  Cough/congestion  Saline rinses As needed   Tylenol As needed   Fluids and rest  Hydromet 1-2 tsp every 4-6 hr As needed  Cough , may make you sleepy  Please contact office for sooner follow up if symptoms do not improve or worsen or seek emergency care

## 2012-04-10 NOTE — Patient Instructions (Addendum)
Omnicef 300mg  Twice daily  For 7 days  Mucinex DM Twice daily  As needed  Cough/congestion  Saline rinses As needed   Tylenol As needed   Fluids and rest  Hydromet 1-2 tsp every 4-6 hr As needed  Cough , may make you sleepy  Please contact office for sooner follow up if symptoms do not improve or worsen or seek emergency care  follow up Melissa Rowe in 2 weeks as planned

## 2012-04-10 NOTE — Progress Notes (Signed)
Subjective:    Patient ID: Melissa Rowe, female    DOB: 11/23/1965, 47 y.o.   MRN: [id number]  HPI 47 yo AAF morbidly obese with known hx of Asthma, OSA on CPAP   Dyspnea due to Obesity and Asthma -> 05/05/2008: Spirometry suggests restriction. CXR 04/19/2008  Normal. CT 02/10/2008  - Neg for PE. No infiltrates -> Methacholine Challenge Test: 06/25/2008: Positive PC20 between 1-4  - 10/11/2009: fev1 2L/70% -> start pulmicort  -11/08/2009: fev1 2.24L/78% and better - > switch to QVAR due to cost - May 2012: fev1 2.32L/85% and normal but symptomactic -> change QVAR to dulera sample,  6day pred burst - Jul 13, 2010: Fev1 2.5L/94%, FVC 3.21/98% and improved -> continue dulera -10/26/2010 FEV1 2.33 L/87%, FVC 2.87 L/88% >cont on dulera  - med calendar 10/26/10    OV 10/26/2010  Presents for follow up and med review. Pt doing good since last ov. Breathing is better w/ less cough or dyspnea.  Today spirometry shows FEV1 at 87% We reviewed all her meds today and organized them into a med calendar. She appears to be taking them correctly.  Today -10/26/2010 FEV1 2.33 L/87%, FVC 2.87 L/88% >cont on dulera   OV 01/23/2011 Followup asthma. Last saw NP 3 months ago. Singulair was keeping her awake so now taking in morning. Cntinues MDI. Past 7-9 days increased sinus drainage, post nasal drip and feeling unwell initially with subjective fever. Drainage was brown=yellow. Clearing up now but still with colored drainage and having to cough it out. Above has resultted in increasd albuterol use to daily and nocturnal awakenings. Though not wheezing per se. Note: has had flu shot  04/03/11  Acute OV  Complains of Cough and congestion for 3 weeks with  non prod (occas yellow). Feels more SOB  And wheezing for last few day. Lots of coughing at night, drainage in throat.  No chest pain or increased edema. No hemoptysis.   02/12/2012 Acute OV  Complains of prod cough with clear mucus, head congestion, runny nose,  tightness/heaviness in chest, some wheezing/SOB x3 weeks .  Needs flu shot. No fever. Only mild wheezing, not bad.  OTC not helping.    Husband w/ failed Kidney transplant this month.  Zpack take as directed.  Mucinex DM Twice daily As needed Cough/congestion  Fluids and rest  Saline nasal rinses As needed  Follow up Dr. Marchelle Gearing in 1 month as planned  Please contact office for sooner follow up if symptoms do not improve or worsen or seek emergency care  Flu shot    OV 03/14/2012  Followup asthma. In terms of asthma she is doing well. She continues on Singulair and  Dulera. The no interim problems.  New issue is that she is having right knee surgery due to osteoarthritis. This is by Dr. Magnus Ivan. She has been told that obesity is causing her any problems and osteoarthritis she is keen on losing weight. In talking to her she eats a lot of fruits and occasional breads and some red meat and ice cream cookies This seems to be the main problem for persistent high weight. She is pretty good about vegetables.   04/10/2012 Acute OV  Complains of head congestion with some yellow/green/bloody mucus, PND, chest congestion w/ same colored mucus, wheezing, increased SOB, tightness in chest, low grade temp x1week.  No chest pain, hemoptysis , chest pain or rash.  OTC not working.   Had Right knee arthroscopy 2 weeks ago.  Significant improvement in pain.  Doing well with rehab .     Review of Systems  Constitutional:   No  weight loss, night sweats,  chills,  +fatigue, or  lassitude.  HEENT:   No headaches,  Difficulty swallowing,  Tooth/dental problems, or  Sore throat,                No sneezing, itching, ear ache,  +nasal congestion, post nasal drip,   CV:  No chest pain,  Orthopnea, PND, swelling in lower extremities, anasarca, dizziness, palpitations, syncope.   GI  No heartburn, indigestion, abdominal pain, nausea, vomiting, diarrhea, change in bowel habits, loss of appetite, bloody  stools.   Resp: N .  No chest wall deformity  Skin: no rash or lesions.  GU: no dysuria, change in color of urine, no urgency or frequency.  No flank pain, no hematuria   MS:  No joint pain or swelling.  No decreased range of motion.  No back pain.  Psych:  No change in mood or affect. No depression or anxiety.  No memory loss.           Objective:   Physical Exam GEN: A/Ox3; pleasant , NAD, morbidly obese   HEENT:  [place]/AT,  EACs-clear, TMs-wnl, NOSE-clear drainage , THROAT-clear, no lesions, no postnasal drip or exudate noted.   NECK:  Supple w/ fair ROM; no JVD; normal carotid impulses w/o bruits; no thyromegaly or nodules palpated; no lymphadenopathy.  RESP  CTA no wheezing no accessory muscle use, no dullness to percussion  CARD:  RRR, no m/r/g  , tr- peripheral edema, pulses intact, no cyanosis or clubbing. Venous insufficiency changes   GI:   Soft & nt; nml bowel sounds; no organomegaly or masses detected.  Musco: Warm bil, no deformities or joint swelling noted.   Neuro: alert, no focal deficits noted.    Skin: Warm, no lesions or rashes          Assessment & Plan:

## 2012-04-21 ENCOUNTER — Ambulatory Visit (INDEPENDENT_AMBULATORY_CARE_PROVIDER_SITE_OTHER): Payer: Medicare Other | Admitting: Internal Medicine

## 2012-04-21 DIAGNOSIS — J45909 Unspecified asthma, uncomplicated: Secondary | ICD-10-CM | POA: Diagnosis not present

## 2012-04-21 DIAGNOSIS — J4540 Moderate persistent asthma, uncomplicated: Secondary | ICD-10-CM

## 2012-04-21 MED ORDER — PREDNISONE (PAK) 10 MG PO TABS: 10.0000 mg | ORAL_TABLET | Freq: Every day | ORAL | Status: DC

## 2012-04-21 NOTE — Progress Notes (Signed)
Subjective:    Patient ID: Melissa Rowe, female    DOB: Jun 26, 1965, 47 y.o.   MRN: [id number]  HPI 47 yo AAF morbidly obese with known hx of Asthma, OSA on CPAP   Dyspnea due to Obesity and Asthma -> 05/05/2008: Spirometry suggests restriction. CXR 04/19/2008  Normal. CT 02/10/2008  - Neg for PE. No infiltrates -> Methacholine Challenge Test: 06/25/2008: Positive PC20 between 1-4  - 10/11/2009: fev1 2L/70% -> start pulmicort  -11/08/2009: fev1 2.24L/78% and better - > switch to QVAR due to cost - May 2012: fev1 2.32L/85% and normal but symptomactic -> change QVAR to dulera sample,  6day pred burst - Jul 13, 2010: Fev1 2.5L/94%, FVC 3.21/98% and improved -> continue dulera -10/26/2010 FEV1 2.33 L/87%, FVC 2.87 L/88% >cont on dulera  - med calendar 10/26/10    OV 10/26/2010  Presents for follow up and med review. Pt doing good since last ov. Breathing is better w/ less cough or dyspnea.  Today spirometry shows FEV1 at 87% We reviewed all her meds today and organized them into a med calendar. She appears to be taking them correctly.  Today -10/26/2010 FEV1 2.33 L/87%, FVC 2.87 L/88% >cont on dulera   OV 01/23/2011 Followup asthma. Last saw NP 3 months ago. Singulair was keeping her awake so now taking in morning. Cntinues MDI. Past 7-9 days increased sinus drainage, post nasal drip and feeling unwell initially with subjective fever. Drainage was brown=yellow. Clearing up now but still with colored drainage and having to cough it out. Above has resultted in increasd albuterol use to daily and nocturnal awakenings. Though not wheezing per se. Note: has had flu shot  04/03/11  Acute OV  Complains of Cough and congestion for 3 weeks with  non prod (occas yellow). Feels more SOB  And wheezing for last few day. Lots of coughing at night, drainage in throat.  No chest pain or increased edema. No hemoptysis.   02/12/2012 Acute OV  Complains of prod cough with clear mucus, head congestion, runny nose,  tightness/heaviness in chest, some wheezing/SOB x3 weeks .  Needs flu shot. No fever. Only mild wheezing, not bad.  OTC not helping.    Husband w/ failed Kidney transplant this month.  Zpack take as directed.  Mucinex DM Twice daily As needed Cough/congestion  Fluids and rest  Saline nasal rinses As needed  Follow up Dr. Marchelle Gearing in 1 month as planned  Please contact office for sooner follow up if symptoms do not improve or worsen or seek emergency care  Flu shot    OV 03/14/2012  Followup asthma. In terms of asthma she is doing well. She continues on Singulair and  Dulera. The no interim problems.  New issue is that she is having right knee surgery due to osteoarthritis. This is by Dr. Magnus Ivan. She has been told that obesity is causing her any problems and osteoarthritis she is keen on losing weight. In talking to her she eats a lot of fruits and occasional breads and some red meat and ice cream cookies This seems to be the main problem for persistent high weight. She is pretty good about vegetables.   04/10/2012 Acute OV  Complains of head congestion with some yellow/green/bloody mucus, PND, chest congestion w/ same colored mucus, wheezing, increased SOB, tightness in chest, low grade temp x1week.  No chest pain, hemoptysis , chest pain or rash.  OTC not working.   Had Right knee arthroscopy 2 weeks ago.  Significant improvement in pain.  Doing well with rehab .   REC Omnicef 300mg  Twice daily For 7 days  Mucinex DM Twice daily As needed Cough/congestion  Saline rinses As needed  Tylenol As needed  Fluids and rest  Hydromet 1-2 tsp every 4-6 hr As needed Cough , may make you sleepy  Please contact office for sooner follow up if symptoms do not improve or worsen or seek emergency care  follow up Dr. Marchelle Gearing in 2 weeks as planned   OV 04/21/2012  #Followup sinus and asthma -saw nurse practitioner 04/10/2012: Since then she she notices continued improvement and sinus  drainage since then but she still complains that she has colored sinus drainage and she has wheezing. Albuterol usage is at least 3 times in the daytime and once at night when she does wake up from her sleep because of wheezing. She feels that the improvement with antibiotics his only partially helpful  #Obesity - His no started on the low glycemic diet. She says has lost 2 pounds. Current body mass index and weight is listed below i. She has several questions about the right foot and low glycemic diet. I have explicitly told her that she should not eat bread or banana. Brussels sprouts and low-fat cottage cheese and plain yogurt but is non-fat are okay.   - Estimated body mass index is 62.69 kg/(m^2) as calculated from the following:   Height as of this encounter: 5\' 6"  (1.676 m).   Weight as of this encounter: 388 lb 3.2 oz (176.086 kg).  Filed Weights   04/21/12 1354  Weight: 388 lb 3.2 oz (176.086 kg)      Review of Systems  Constitutional: Negative for fever and unexpected weight change.  HENT: Positive for congestion, rhinorrhea and postnasal drip. Negative for ear pain, nosebleeds, sore throat, sneezing, trouble swallowing, dental problem and sinus pressure.   Eyes: Negative for redness and itching.  Respiratory: Positive for cough. Negative for chest tightness, shortness of breath and wheezing.   Cardiovascular: Negative for palpitations and leg swelling.  Gastrointestinal: Negative for nausea and vomiting.  Genitourinary: Negative for dysuria.  Musculoskeletal: Negative for joint swelling.  Skin: Negative for rash.  Neurological: Negative for headaches.  Hematological: Does not bruise/bleed easily.  Psychiatric/Behavioral: Negative for dysphoric mood. The patient is not nervous/anxious.        Objective:   Physical Exam GEN: A/Ox3; pleasant , NAD, morbidly obese   HEENT:  [place]/AT,  EACs-clear, TMs-wnl, NOSE-clear drainage , THROAT-clear, no lesions, no postnasal drip or  exudate noted.   NECK:  Supple w/ fair ROM; no JVD; normal carotid impulses w/o bruits; no thyromegaly or nodules palpated; no lymphadenopathy.  RESP  CTA no wheezing no accessory muscle use, no dullness to percussion  CARD:  RRR, no m/r/g  , tr- peripheral edema, pulses intact, no cyanosis or clubbing. Venous insufficiency changes   GI:   Soft & nt; nml bowel sounds; no organomegaly or masses detected.  Musco: Warm bil, no deformities or joint swelling noted.   Neuro: alert, no focal deficits noted.    Skin: Warm, no lesions or rashes             Assessment & Plan:

## 2012-04-21 NOTE — Patient Instructions (Addendum)
#  Weight management   keep up the good work - You are off to good start  #Sinus and asthma - You might still have a mild residual asthma flare going on - Take prednisone 40 mg daily x 2 days, then 20mg  daily x 2 days, then 10mg  daily x 2 days, then 5mg  daily x 2 days and stop  #Followup - 2 months to evaluate progress with weight loss  -

## 2012-04-27 ENCOUNTER — Encounter: Payer: Self-pay | Admitting: Internal Medicine

## 2012-04-27 NOTE — Assessment & Plan Note (Signed)
#  Sinus and asthma - You might still have a mild residual asthma flare going on - Take prednisone 40 mg daily x 2 days, then 20mg  daily x 2 days, then 10mg  daily x 2 days, then 5mg  daily x 2 days and stop  #Followup - 2 months to evaluate progress with weight loss  -

## 2012-04-27 NOTE — Assessment & Plan Note (Signed)
#  Weight management   keep up the good work - You are off to good start

## 2012-05-20 ENCOUNTER — Telehealth: Payer: Self-pay | Admitting: Internal Medicine

## 2012-05-20 DIAGNOSIS — E282 Polycystic ovarian syndrome: Secondary | ICD-10-CM | POA: Diagnosis not present

## 2012-05-20 DIAGNOSIS — D649 Anemia, unspecified: Secondary | ICD-10-CM | POA: Diagnosis not present

## 2012-05-20 DIAGNOSIS — R7301 Impaired fasting glucose: Secondary | ICD-10-CM | POA: Diagnosis not present

## 2012-05-20 DIAGNOSIS — E785 Hyperlipidemia, unspecified: Secondary | ICD-10-CM | POA: Diagnosis not present

## 2012-05-20 DIAGNOSIS — J45909 Unspecified asthma, uncomplicated: Secondary | ICD-10-CM | POA: Diagnosis not present

## 2012-05-20 DIAGNOSIS — E039 Hypothyroidism, unspecified: Secondary | ICD-10-CM | POA: Diagnosis not present

## 2012-05-20 MED ORDER — MONTELUKAST SODIUM 10 MG PO TABS: 10.0000 mg | ORAL_TABLET | Freq: Every day | ORAL | Status: DC

## 2012-05-20 NOTE — Telephone Encounter (Signed)
Rx has been sent in. Pt is aware. 

## 2012-05-21 DIAGNOSIS — D649 Anemia, unspecified: Secondary | ICD-10-CM | POA: Diagnosis not present

## 2012-05-21 DIAGNOSIS — E282 Polycystic ovarian syndrome: Secondary | ICD-10-CM | POA: Diagnosis not present

## 2012-05-22 DIAGNOSIS — M545 Low back pain: Secondary | ICD-10-CM | POA: Diagnosis not present

## 2012-06-10 DIAGNOSIS — R109 Unspecified abdominal pain: Secondary | ICD-10-CM | POA: Diagnosis not present

## 2012-06-19 DIAGNOSIS — M1710 Unilateral primary osteoarthritis, unspecified knee: Secondary | ICD-10-CM | POA: Diagnosis not present

## 2012-06-19 DIAGNOSIS — M25569 Pain in unspecified knee: Secondary | ICD-10-CM | POA: Diagnosis not present

## 2012-06-24 ENCOUNTER — Encounter: Payer: Self-pay | Admitting: Internal Medicine

## 2012-06-24 ENCOUNTER — Ambulatory Visit (INDEPENDENT_AMBULATORY_CARE_PROVIDER_SITE_OTHER): Payer: Medicare Other | Admitting: Internal Medicine

## 2012-06-24 DIAGNOSIS — K625 Hemorrhage of anus and rectum: Secondary | ICD-10-CM | POA: Diagnosis not present

## 2012-06-24 DIAGNOSIS — R109 Unspecified abdominal pain: Secondary | ICD-10-CM | POA: Diagnosis not present

## 2012-06-24 DIAGNOSIS — K921 Melena: Secondary | ICD-10-CM | POA: Diagnosis not present

## 2012-06-24 DIAGNOSIS — K5900 Constipation, unspecified: Secondary | ICD-10-CM | POA: Diagnosis not present

## 2012-06-24 DIAGNOSIS — D509 Iron deficiency anemia, unspecified: Secondary | ICD-10-CM | POA: Diagnosis not present

## 2012-06-24 DIAGNOSIS — K644 Residual hemorrhoidal skin tags: Secondary | ICD-10-CM | POA: Diagnosis not present

## 2012-06-24 NOTE — Progress Notes (Signed)
Subjective:    Patient ID: Melissa Rowe, female    DOB: [birth year], 47 y.o.   MRN: [id number]  HPI 47 yo AAF morbidly obese with known hx of Asthma, OSA on CPAP   Dyspnea due to Obesity and Asthma -> 05/05/2008: Spirometry suggests restriction. CXR 04/19/2008  Normal. CT 02/10/2008  - Neg for PE. No infiltrates -> Methacholine Challenge Test: 06/25/2008: Positive PC20 between 1-4  - 10/11/2009: fev1 2L/70% -> start pulmicort  -11/08/2009: fev1 2.24L/78% and better - > switch to QVAR due to cost - May 2012: fev1 2.32L/85% and normal but symptomactic -> change QVAR to dulera sample,  6day pred burst - Jul 13, 2010: Fev1 2.5L/94%, FVC 3.21/98% and improved -> continue dulera -10/26/2010 FEV1 2.33 L/87%, FVC 2.87 L/88% >cont on dulera  - med calendar 10/26/10    OV 10/26/2010  Presents for follow up and med review. Pt doing good since last ov. Breathing is better w/ less cough or dyspnea.  Today spirometry shows FEV1 at 87% We reviewed all her meds today and organized them into a med calendar. She appears to be taking them correctly.  Today -10/26/2010 FEV1 2.33 L/87%, FVC 2.87 L/88% >cont on dulera   OV 01/23/2011 Followup asthma. Last saw NP 3 months ago. Singulair was keeping her awake so now taking in morning. Cntinues MDI. Past 7-9 days increased sinus drainage, post nasal drip and feeling unwell initially with subjective fever. Drainage was brown=yellow. Clearing up now but still with colored drainage and having to cough it out. Above has resultted in increasd albuterol use to daily and nocturnal awakenings. Though not wheezing per se. Note: has had flu shot  04/03/11  Acute OV  Complains of Cough and congestion for 3 weeks with  non prod (occas yellow). Feels more SOB  And wheezing for last few day. Lots of coughing at night, drainage in throat.  No chest pain or increased edema. No hemoptysis.   02/12/2012 Acute OV  Complains of prod cough with clear mucus, head congestion, runny nose,  tightness/heaviness in chest, some wheezing/SOB x3 weeks .  Needs flu shot. No fever. Only mild wheezing, not bad.  OTC not helping.    Husband w/ failed Kidney transplant this month.  Zpack take as directed.  Mucinex DM Twice daily As needed Cough/congestion  Fluids and rest  Saline nasal rinses As needed  Follow up Dr. Marchelle Gearing in 1 month as planned  Please contact office for sooner follow up if symptoms do not improve or worsen or seek emergency care  Flu shot    OV 03/14/2012  Followup asthma. In terms of asthma she is doing well. She continues on Singulair and  Dulera. The no interim problems.  New issue is that she is having right knee surgery due to osteoarthritis. This is by Dr. Magnus Ivan. She has been told that obesity is causing her any problems and osteoarthritis she is keen on losing weight. In talking to her she eats a lot of fruits and occasional breads and some red meat and ice cream cookies This seems to be the main problem for persistent high weight. She is pretty good about vegetables.   04/10/2012 Acute OV  Complains of head congestion with some yellow/green/bloody mucus, PND, chest congestion w/ same colored mucus, wheezing, increased SOB, tightness in chest, low grade temp x1week.  No chest pain, hemoptysis , chest pain or rash.  OTC not working.   Had Right knee arthroscopy 2 weeks ago.  Significant improvement in pain.  Doing well with rehab .   REC Omnicef 300mg  Twice daily For 7 days  Mucinex DM Twice daily As needed Cough/congestion  Saline rinses As needed  Tylenol As needed  Fluids and rest  Hydromet 1-2 tsp every 4-6 hr As needed Cough , may make you sleepy  Please contact office for sooner follow up if symptoms do not improve or worsen or seek emergency care  follow up Dr. Marchelle Gearing in 2 weeks as planned   OV 04/21/2012  #Followup sinus and asthma -saw nurse practitioner 04/10/2012: Since then she she notices continued improvement and sinus  drainage since then but she still complains that she has colored sinus drainage and she has wheezing. Albuterol usage is at least 3 times in the daytime and once at night when she does wake up from her sleep because of wheezing. She feels that the improvement with antibiotics his only partially helpful  #Obesity -  not started on the low glycemic diet. She says has lost 2 pounds. Current body mass index and weight is listed below i. She has several questions about the right foot and low glycemic diet. I have explicitly told her that she should not eat bread or banana. Brussels sprouts and low-fat cottage cheese and plain yogurt but is non-fat are okay.   - Estimated body mass index is 62.69 kg/(m^2) as calculated from the following:   Height as of this encounter: 5\' 6"  (1.676 m).   Weight as of this encounter: 388 lb 3.2 oz (176.086 kg).  Filed Weights   04/21/12 1354  Weight: 388 lb 3.2 oz (176.086 kg)     #Weight management  keep up the good work  - You are off to good start  #Sinus and asthma  - You might still have a mild residual asthma flare going on  - Take prednisone 40 mg daily x 2 days, then 20mg  daily x 2 days, then 10mg  daily x 2 days, then 5mg  daily x 2 days and stop  #Followup  - 2 months to evaluate progress with weight loss    OV 06/24/2012 Asthma patient. Here for weight loss discussion.    Since last visit on QSYMIA. SHe has lost 18#. Following low glycemic diet but mistakenly eating raisins, carrots and oranges. Having lot of cravings. Wants to know how to improve will power. She is determined to lose more weight  Estimated body mass index is 60.65 kg/(m^2) as calculated from the following:   Height as of this encounter: 5\' 6"  (1.676 m).   Weight as of this encounter: 375 lb 9.6 oz (170.371 kg).   No Review of Systems  Constitutional: Negative for fever and unexpected weight change.  HENT: Negative for ear pain, nosebleeds, congestion, sore throat,  rhinorrhea, sneezing, trouble swallowing, dental problem, postnasal drip and sinus pressure.   Eyes: Negative for redness and itching.  Respiratory: Negative for cough, chest tightness, shortness of breath and wheezing.   Cardiovascular: Negative for palpitations and leg swelling.  Gastrointestinal: Negative for nausea and vomiting.  Genitourinary: Negative for dysuria.  Musculoskeletal: Negative for joint swelling.  Skin: Negative for rash.  Neurological: Negative for headaches.  Hematological: Does not bruise/bleed easily.  Psychiatric/Behavioral: Negative for dysphoric mood. The patient is not nervous/anxious.    Current outpatient prescriptions:albuterol (PROVENTIL HFA;VENTOLIN HFA) 108 (90 BASE) MCG/ACT inhaler, Inhale 2 puffs into the lungs every 6 (six) hours as needed. For shortness of breath., Disp: , Rfl: ;  aspirin EC 325 MG tablet, Take 1 tablet (  325 mg total) by mouth 2 (two) times daily., Disp: 30 tablet, Rfl: 0;  calcium carbonate (OS-CAL) 600 MG TABS, Take 600 mg by mouth daily. , Disp: , Rfl:  cyclobenzaprine (FLEXERIL) 10 MG tablet, Take 10 mg by mouth 2 (two) times daily as needed. For muscle spasms, Disp: , Rfl: ;  furosemide (LASIX) 20 MG tablet, Take 1 tablet (20 mg total) by mouth 2 (two) times daily., Disp: 60 tablet, Rfl: 2;  HYDROcodone-homatropine (HYDROMET) 5-1.5 MG/5ML syrup, Take 5 mLs by mouth every 6 (six) hours as needed for cough., Disp: 240 mL, Rfl: 0 levothyroxine (SYNTHROID, LEVOTHROID) 50 MCG tablet, Take 50 mcg by mouth daily., Disp: , Rfl: ;  Mometasone Furo-Formoterol Fum 200-5 MCG/ACT AERO, Inhale 2 puffs into the lungs 2 (two) times daily., Disp: , Rfl: ;  montelukast (SINGULAIR) 10 MG tablet, Take 1 tablet (10 mg total) by mouth daily., Disp: 30 tablet, Rfl: 5;  Multiple Vitamin (MULTIVITAMIN) capsule, Take 1 capsule by mouth daily.  , Disp: , Rfl:  Omeprazole-Sodium Bicarbonate (ZEGERID OTC PO), Take 1 tablet by mouth daily., Disp: , Rfl: ;   oxyCODONE-acetaminophen (ROXICET) 5-325 MG per tablet, Take 1-2 tablets by mouth every 4 (four) hours as needed for pain., Disp: 60 tablet, Rfl: 0;  Phentermine-Topiramate (QSYMIA) 3.75-23 MG CP24, Take 1 tablet by mouth daily., Disp: , Rfl: ;  spironolactone (ALDACTONE) 25 MG tablet, Take 1 tablet by mouth daily., Disp: , Rfl:  tolterodine (DETROL LA) 2 MG 24 hr capsule, Take 2 mg by mouth daily., Disp: , Rfl: ;  traMADol (ULTRAM) 50 MG tablet, Take 1 tablet by mouth daily., Disp: , Rfl:      Objective:   Physical Exam GEN: A/Ox3; pleasant , NAD, morbidly obese  discussion only visit             Assessment & Plan:

## 2012-06-24 NOTE — Patient Instructions (Addendum)
#WEight Management    - we discussed extensively about weight management   - follow low glycemic diet plan that I outlined for you after extensive discussion. Do not follow other plans  - General  - drink lot of water  - avoid all moderate and high glycemic foods especially bad fruits, breads, pastas, fried foods, battered foods, sugary foods (these are the food in centerl and right lane that you have to avoid)  - make non-starchy vegetables your base in terms of volume you eat; 50% of what you see on the plate and goes into your mouth should be these vegetables (the vegetables in the left lane)  - always make sure you balance good carbs, good protein and good fat source  - good carbs are non-starchy vegetables, uncanned beans in the left column and low glycemic fruits in the left colum  - good protein source is egg white, beans, tofu, fish, chicken breast, fish, [place] and bison. Remember meat has to be skinless  - good healthy fat source is nuts, and fish   - focusing on eating right healthy foods (left lane) and avoiding unhealthy foods (middle and right lane) is better way to lose weight than to go hypo-caloric  - focus on staying full by eating right  - having a daily and weekly plan for what you will eat and where you will eat depending on your work, social life schedule is very important   - watch out for misleading labels on grocery aisle: High Fiber and Low Fat labeled foods generally are high in bad carbs or sugar  - measure weight once  a week  - discipline and attitude is key. Do not care for anyone else's opinion or feelings. Only yours matters   - For breakfast  - most important meal of the day. So, eat daily breakfast. Do not skip   - recommend 1/2 to 1 cup steel cut oat meal or 1/2 to 1 cup fiber one 60 cal   Or  1 to 1.5 cups Kashi go-lean with non-fat plain milk or 60 Cal Silk Soy mild. Can add Berries. Can have egg at same time for breakfast  - For snacks  - recommend total  2-3 snacks per day  - snack should be light and filling  - best times are between breakfast and lunch, lunch and dinner and sometimes post-dinner snack  - Nut are great snacks. Stick to low glycemic nuts (less than 50gm per day) and eat only the nuts in the left lane like peanuts, pista, almond, walnut  - Low glycemic fruits are great snacks. Have 1-2 servings each day of fruits from the left lane   - If you like yogurt or cottage cheese - recommend Oikos or Fage 0% greek yogourt or Plan non-fat yogurt or Breakstone non-fat cottage cheese. Theyse have the least sugar. Do no exceed 100-200 gram per day. Fruit yogurts are the worst  - Good Protein Shakes are good snacks:  EAS Abbot Whey Powder shake, EAS Myoplex lite, EAS Carb Control. Muscle Milk Shake  - For Lunch and dinner  - unlimited non-starchy vegetable (prefer raw fresh or roasted or grilled) with skinless chicken or fish  - Special Notes  - Nuts: Nut are great snacks and have heart benefits. Stick to low glycemic nuts (less than 50gm per day) and eat only the nuts in the left lane like peanuts, pista, almond, walnuts  -  Ok to eat above nuts daily but only < 50gm/day  -  If you eat more than 50gm/day then you run risk of eating too many calories or saturated fat  - AVoid nuts glazed with sugar. Nuts have to be in salted/original form or roasted   - Fruits: Eat 1-2 fresh fruit servings daily but fruits can be dangerous because of high sugar content. So, choose your fruits wisely. Eat only the low glycemic fruits (left lane). Eat them fresh.  Do not eat them canned  - Avoid all fresh juices except if you use the low glycemic fruits and make them yourself without adding extra sugar   - Dairy: Is optional. Eat zero fat or low fat, fruit free yogurts or cottage cheese but not more than 100-200g per day  - Restaurant  - all restaurants have bad and good choices. Even fast food restaurants offer you good choices  - at restaurants do no fall  prey to social pressure.. One way to eat healthy at restaurant is to eat healthy snack or light healthy meal before you go to restaurant so that will prevent your cravings  - Restaurants with worst choices: [place] (except [employer], or [employer]), [place], [place]. At these restaurants avoid the bread, curry, fried and battered foods and chips  - Restaurants with best choices: greek, [place], [place], [place] (again here avoid bread, deep fried stuffed and pasta)  - Restaurants with Ok choice: [employer], [employer], [employer] (again here avoid the bread, fried stuff, fried meat)  - Always ask for grilled meat or vegetables, and fresh salad choices (get your salad dressing as low fat and to the side)  - For Will Power  - Prepare, prepare, prepare: Plan your day and think of what you will eat and when you will eat and where you will eat.  - Snack good stuff to keep yourself full to avoid hunger and losing will power  - 1 minute fast walk when feeling cravings could  help   - return to see me in 2 months for asthma and weight

## 2012-06-25 DIAGNOSIS — D509 Iron deficiency anemia, unspecified: Secondary | ICD-10-CM | POA: Diagnosis not present

## 2012-06-25 NOTE — Assessment & Plan Note (Signed)
#WEight Management    - we discussed extensively about weight management   - follow low glycemic diet plan that I outlined for you after extensive discussion. Do not follow other plans  - General  - drink lot of water  - avoid all moderate and high glycemic foods especially bad fruits, breads, pastas, fried foods, battered foods, sugary foods (these are the food in centerl and right lane that you have to avoid)  - make non-starchy vegetables your base in terms of volume you eat; 50% of what you see on the plate and goes into your mouth should be these vegetables (the vegetables in the left lane)  - always make sure you balance good carbs, good protein and good fat source  - good carbs are non-starchy vegetables, uncanned beans in the left column and low glycemic fruits in the left colum  - good protein source is egg white, beans, tofu, fish, chicken breast, fish, [place] and bison. Remember meat has to be skinless  - good healthy fat source is nuts, and fish   - focusing on eating right healthy foods (left lane) and avoiding unhealthy foods (middle and right lane) is better way to lose weight than to go hypo-caloric  - focus on staying full by eating right  - having a daily and weekly plan for what you will eat and where you will eat depending on your work, social life schedule is very important   - watch out for misleading labels on grocery aisle: High Fiber and Low Fat labeled foods generally are high in bad carbs or sugar  - measure weight once  a week  - discipline and attitude is key. Do not care for anyone else's opinion or feelings. Only yours matters   - For breakfast  - most important meal of the day. So, eat daily breakfast. Do not skip   - recommend 1/2 to 1 cup steel cut oat meal or 1/2 to 1 cup fiber one 60 cal   Or  1 to 1.5 cups Kashi go-lean with non-fat plain milk or 60 Cal Silk Soy mild. Can add Berries. Can have egg at same time for breakfast  - For snacks  - recommend total  2-3 snacks per day  - snack should be light and filling  - best times are between breakfast and lunch, lunch and dinner and sometimes post-dinner snack  - Nut are great snacks. Stick to low glycemic nuts (less than 50gm per day) and eat only the nuts in the left lane like peanuts, pista, almond, walnut  - Low glycemic fruits are great snacks. Have 1-2 servings each day of fruits from the left lane   - If you like yogurt or cottage cheese - recommend Oikos or Fage 0% greek yogourt or Plan non-fat yogurt or Breakstone non-fat cottage cheese. Theyse have the least sugar. Do no exceed 100-200 gram per day. Fruit yogurts are the worst  - Good Protein Shakes are good snacks:  EAS Abbot Whey Powder shake, EAS Myoplex lite, EAS Carb Control. Muscle Milk Shake  - For Lunch and dinner  - unlimited non-starchy vegetable (prefer raw fresh or roasted or grilled) with skinless chicken or fish  - Special Notes  - Nuts: Nut are great snacks and have heart benefits. Stick to low glycemic nuts (less than 50gm per day) and eat only the nuts in the left lane like peanuts, pista, almond, walnuts  -  Ok to eat above nuts daily but only < 50gm/day  -  If you eat more than 50gm/day then you run risk of eating too many calories or saturated fat  - AVoid nuts glazed with sugar. Nuts have to be in salted/original form or roasted   - Fruits: Eat 1-2 fresh fruit servings daily but fruits can be dangerous because of high sugar content. So, choose your fruits wisely. Eat only the low glycemic fruits (left lane). Eat them fresh.  Do not eat them canned  - Avoid all fresh juices except if you use the low glycemic fruits and make them yourself without adding extra sugar   - Dairy: Is optional. Eat zero fat or low fat, fruit free yogurts or cottage cheese but not more than 100-200g per day  - Restaurant  - all restaurants have bad and good choices. Even fast food restaurants offer you good choices  - at restaurants do no fall  prey to social pressure.. One way to eat healthy at restaurant is to eat healthy snack or light healthy meal before you go to restaurant so that will prevent your cravings  - Restaurants with worst choices: [place] (except [employer], or [employer]), [place], [place]. At these restaurants avoid the bread, curry, fried and battered foods and chips  - Restaurants with best choices: greek, [place], [place], [place] (again here avoid bread, deep fried stuffed and pasta)  - Restaurants with Ok choice: [employer]'s, [employer], [employer] (again here avoid the bread, fried stuff, fried meat)  - Always ask for grilled meat or vegetables, and fresh salad choices (get your salad dressing as low fat and to the side)  - For Will Power  - Prepare, prepare, prepare: Plan your day and think of what you will eat and when you will eat and where you will eat.  - Snack good stuff to keep yourself full to avoid hunger and losing will power  - 1 minute fast walk when feeling cravings could  help   - return to see me in 2 months for asthma and weight   > 50% of this > 25 min visit spent in face to face counseling (15 min visit converted to 25 min)

## 2012-08-26 DIAGNOSIS — D509 Iron deficiency anemia, unspecified: Secondary | ICD-10-CM | POA: Diagnosis not present

## 2012-08-26 DIAGNOSIS — Z0000 Encounter for general adult medical examination without abnormal findings: Secondary | ICD-10-CM | POA: Diagnosis not present

## 2012-09-01 DIAGNOSIS — Z1212 Encounter for screening for malignant neoplasm of rectum: Secondary | ICD-10-CM | POA: Diagnosis not present

## 2012-09-02 ENCOUNTER — Encounter: Payer: Self-pay | Admitting: Internal Medicine

## 2012-09-02 ENCOUNTER — Ambulatory Visit (INDEPENDENT_AMBULATORY_CARE_PROVIDER_SITE_OTHER): Payer: Medicare Other | Admitting: Internal Medicine

## 2012-09-02 DIAGNOSIS — Z01811 Encounter for preprocedural respiratory examination: Secondary | ICD-10-CM | POA: Diagnosis not present

## 2012-09-02 DIAGNOSIS — J329 Chronic sinusitis, unspecified: Secondary | ICD-10-CM | POA: Diagnosis not present

## 2012-09-02 DIAGNOSIS — J45909 Unspecified asthma, uncomplicated: Secondary | ICD-10-CM | POA: Diagnosis not present

## 2012-09-02 DIAGNOSIS — R0982 Postnasal drip: Secondary | ICD-10-CM

## 2012-09-02 DIAGNOSIS — J4540 Moderate persistent asthma, uncomplicated: Secondary | ICD-10-CM

## 2012-09-02 NOTE — Progress Notes (Signed)
Subjective:    Patient ID: Melissa Rowe, female    DOB: [birth year], 47 y.o.   MRN: [id number]  HPI 47 yo AAF morbidly obese with known hx of Asthma, OSA on CPAP   Dyspnea due to Obesity and Asthma -> 05/05/2008: Spirometry suggests restriction. CXR 04/19/2008  Normal. CT 02/10/2008  - Neg for PE. No infiltrates -> Methacholine Challenge Test: 06/25/2008: Positive PC20 between 1-4  - 10/11/2009: fev1 2L/70% -> start pulmicort  -11/08/2009: fev1 2.24L/78% and better - > switch to QVAR due to cost - May 2012: fev1 2.32L/85% and normal but symptomactic -> change QVAR to dulera sample,  6day pred burst - Jul 13, 2010: Fev1 2.5L/94%, FVC 3.21/98% and improved -> continue dulera -10/26/2010 FEV1 2.33 L/87%, FVC 2.87 L/88% >cont on dulera  - med calendar 10/26/10    OV 10/26/2010  Presents for follow up and med review. Pt doing good since last ov. Breathing is better w/ less cough or dyspnea.  Today spirometry shows FEV1 at 87% We reviewed all her meds today and organized them into a med calendar. She appears to be taking them correctly.  Today -10/26/2010 FEV1 2.33 L/87%, FVC 2.87 L/88% >cont on dulera   OV 01/23/2011 Followup asthma. Last saw NP 3 months ago. Singulair was keeping her awake so now taking in morning. Cntinues MDI. Past 7-9 days increased sinus drainage, post nasal drip and feeling unwell initially with subjective fever. Drainage was brown=yellow. Clearing up now but still with colored drainage and having to cough it out. Above has resultted in increasd albuterol use to daily and nocturnal awakenings. Though not wheezing per se. Note: has had flu shot  04/03/11  Acute OV  Complains of Cough and congestion for 3 weeks with  non prod (occas yellow). Feels more SOB  And wheezing for last few day. Lots of coughing at night, drainage in throat.  No chest pain or increased edema. No hemoptysis.   02/12/2012 Acute OV  Complains of prod cough with clear mucus, head congestion, runny nose,  tightness/heaviness in chest, some wheezing/SOB x3 weeks .  Needs flu shot. No fever. Only mild wheezing, not bad.  OTC not helping.    Husband w/ failed Kidney transplant this month.  Zpack take as directed.  Mucinex DM Twice daily As needed Cough/congestion  Fluids and rest  Saline nasal rinses As needed  Follow up Dr. Marchelle Gearing in 1 month as planned  Please contact office for sooner follow up if symptoms do not improve or worsen or seek emergency care  Flu shot    OV 03/14/2012  Followup asthma. In terms of asthma she is doing well. She continues on Singulair and  Dulera. The no interim problems.  New issue is that she is having right knee surgery due to osteoarthritis. This is by Dr. Magnus Ivan. She has been told that obesity is causing her any problems and osteoarthritis she is keen on losing weight. In talking to her she eats a lot of fruits and occasional breads and some red meat and ice cream cookies This seems to be the main problem for persistent high weight. She is pretty good about vegetables.   04/10/2012 Acute OV  Complains of head congestion with some yellow/green/bloody mucus, PND, chest congestion w/ same colored mucus, wheezing, increased SOB, tightness in chest, low grade temp x1week.  No chest pain, hemoptysis , chest pain or rash.  OTC not working.   Had Right knee arthroscopy 2 weeks ago.  Significant improvement in pain.  Doing well with rehab .   REC Omnicef 300mg  Twice daily For 7 days  Mucinex DM Twice daily As needed Cough/congestion  Saline rinses As needed  Tylenol As needed  Fluids and rest  Hydromet 1-2 tsp every 4-6 hr As needed Cough , may make you sleepy  Please contact office for sooner follow up if symptoms do not improve or worsen or seek emergency care  follow up Dr. Marchelle Gearing in 2 weeks as planned   OV 04/21/2012  #Followup sinus and asthma -saw nurse practitioner 04/10/2012: Since then she she notices continued improvement and sinus  drainage since then but she still complains that she has colored sinus drainage and she has wheezing. Albuterol usage is at least 3 times in the daytime and once at night when she does wake up from her sleep because of wheezing. She feels that the improvement with antibiotics his only partially helpful  #Obesity -  not started on the low glycemic diet. She says has lost 2 pounds. Current body mass index and weight is listed below i. She has several questions about the right foot and low glycemic diet. I have explicitly told her that she should not eat bread or banana. Brussels sprouts and low-fat cottage cheese and plain yogurt but is non-fat are okay.   - Estimated body mass index is 62.69 kg/(m^2) as calculated from the following:   Height as of this encounter: 5\' 6"  (1.676 m).   Weight as of this encounter: 388 lb 3.2 oz (176.086 kg).  Filed Weights   04/21/12 1354  Weight: 388 lb 3.2 oz (176.086 kg)     #Weight management  keep up the good work  - You are off to good start  #Sinus and asthma  - You might still have a mild residual asthma flare going on  - Take prednisone 40 mg daily x 2 days, then 20mg  daily x 2 days, then 10mg  daily x 2 days, then 5mg  daily x 2 days and stop  #Followup  - 2 months to evaluate progress with weight loss    OV 06/24/2012 Asthma patient. Here for weight loss discussion.    Since last visit on QSYMIA. SHe has lost 18#. Following low glycemic diet but mistakenly eating raisins, carrots and oranges. Having lot of cravings. Wants to know how to improve will power. She is determined to lose more weight  Estimated body mass index is 60.65 kg/(m^2) as calculated from the following:   Height as of this encounter: 5\' 6"  (1.676 m).   Weight as of this encounter: 375 lb 9.6 oz (170.371 kg).   REC - return to see me in 2 months for asthma and weight   OV 09/02/2012   [facility] and Sinus Obesity Preop pulm eval  Asthma and Sinus  - overall stable.  Continues singulair and dulera but sinuses acting up. Clear drainge post nasal. Making her gag   Obesity   - still on qysmia. Has gained weight. Insists she is aherent to low glycemic diet and is taking 50% of oral intake as vegetables. On questioning she is taking several cups ofcereal, carrots, apples, high sugar soy milk and not eating nuts. She insists she is not eating rice or bread. There is associated new consitipation   Estimated body mass index is 62.33 kg/(m^2) as calculated from the following:   Height as of this encounter: 5\' 6"  (1.676 m).   Weight as of this encounter: 175.088 kg (386 lb).  Preop pulm eval  -  needs Rt knee TKR but ortho wants her to loser 100# whichI agree with   Past, Family, Social reviewed: no change since last visit    Review of Systems  Constitutional: Negative for fever and unexpected weight change.  HENT: Positive for postnasal drip. Negative for ear pain, nosebleeds, congestion, sore throat, rhinorrhea, sneezing, trouble swallowing, dental problem and sinus pressure.   Eyes: Negative for redness and itching.  Respiratory: Positive for cough. Negative for chest tightness, shortness of breath and wheezing.   Cardiovascular: Negative for palpitations and leg swelling.  Gastrointestinal: Negative for nausea and vomiting.  Genitourinary: Negative for dysuria.  Musculoskeletal: Negative for joint swelling.  Skin: Negative for rash.  Neurological: Negative for headaches.  Hematological: Does not bruise/bleed easily.  Psychiatric/Behavioral: Negative for dysphoric mood. The patient is not nervous/anxious.    Current outpatient prescriptions:albuterol (PROVENTIL HFA;VENTOLIN HFA) 108 (90 BASE) MCG/ACT inhaler, Inhale 2 puffs into the lungs every 6 (six) hours as needed. For shortness of breath., Disp: , Rfl: ;  calcium carbonate (OS-CAL) 600 MG TABS, Take 600 mg by mouth daily. , Disp: , Rfl: ;  cyclobenzaprine (FLEXERIL) 10 MG tablet, Take 10 mg by mouth 2  (two) times daily as needed. For muscle spasms, Disp: , Rfl:  HYDROcodone-homatropine (HYDROMET) 5-1.5 MG/5ML syrup, Take 5 mLs by mouth every 6 (six) hours as needed for cough., Disp: 240 mL, Rfl: 0;  levothyroxine (SYNTHROID, LEVOTHROID) 50 MCG tablet, Take 50 mcg by mouth daily., Disp: , Rfl: ;  Mometasone Furo-Formoterol Fum 200-5 MCG/ACT AERO, Inhale 2 puffs into the lungs 2 (two) times daily., Disp: , Rfl:  montelukast (SINGULAIR) 10 MG tablet, Take 1 tablet (10 mg total) by mouth daily., Disp: 30 tablet, Rfl: 5;  Multiple Vitamin (MULTIVITAMIN) capsule, Take 1 capsule by mouth daily.  , Disp: , Rfl: ;  Omeprazole-Sodium Bicarbonate (ZEGERID OTC PO), Take 1 tablet by mouth daily., Disp: , Rfl: ;  Phentermine-Topiramate (QSYMIA) 3.75-23 MG CP24, Take 1 tablet by mouth daily., Disp: , Rfl:  polyethylene glycol (MIRALAX / GLYCOLAX) packet, Take 17 g by mouth daily., Disp: , Rfl: ;  spironolactone (ALDACTONE) 25 MG tablet, Take 1 tablet by mouth daily., Disp: , Rfl: ;  tolterodine (DETROL LA) 2 MG 24 hr capsule, Take 2 mg by mouth daily., Disp: , Rfl: ;  traMADol (ULTRAM) 50 MG tablet, Take 1 tablet by mouth daily., Disp: , Rfl:      Objective:   Physical Exam  Vitals reviewed. Constitutional: She is oriented to person, place, and time. She appears well-developed and well-nourished. No distress.  Body mass index is 62.33 kg/(m^2).   HENT:  Head: Normocephalic and atraumatic.  Right Ear: External ear normal.  Left Ear: External ear normal.  Mouth/Throat: Oropharynx is clear and moist. No oropharyngeal exudate.  Post nasal drip +  Eyes: Conjunctivae and EOM are normal. Pupils are equal, round, and reactive to light. Right eye exhibits no discharge. Left eye exhibits no discharge. No scleral icterus.  Neck: Normal range of motion. Neck supple. No JVD present. No tracheal deviation present. No thyromegaly present.  Cardiovascular: Normal rate, regular rhythm, normal heart sounds and intact distal  pulses.  Exam reveals no gallop and no friction rub.   No murmur heard. Pulmonary/Chest: Effort normal and breath sounds normal. No respiratory distress. She has no wheezes. She has no rales. She exhibits no tenderness.  Abdominal: Soft. Bowel sounds are normal. She exhibits no distension and no mass. There is no tenderness. There is no rebound  and no guarding.  Musculoskeletal: Normal range of motion. She exhibits no edema and no tenderness.  Lymphadenopathy:    She has no cervical adenopathy.  Neurological: She is alert and oriented to person, place, and time. She has normal reflexes. No cranial nerve deficit. She exhibits normal muscle tone. Coordination normal.  Skin: Skin is warm and dry. No rash noted. She is not diaphoretic. No erythema. No pallor.  Psychiatric: She has a normal mood and affect. Her behavior is normal. Judgment and thought content normal.         Assessment & Plan:

## 2012-09-02 NOTE — Assessment & Plan Note (Signed)
Contine saline nasal spray Start nsal steroid due to worsening

## 2012-09-02 NOTE — Patient Instructions (Addendum)
#  ASthma  - stable, continue singulair an dulera  #Constipation  - likely related to diet but try docusate 100mg  tab twice daily  - drink lot of water  #Sinus drainge  - continue saline nasal irrigation daily  -  take generic fluticasone inhaler or OTC nasacort, 2 squirts each nostril daily  #Weight loss  - half of what goes in your mouth should be vegetables in the left column  - will give you diet sheet again; stick to foods in left column - cut down on kashi go lean to 1 to 1.5 cups per day  - change milk to unsweetened soy or lite vanilla soy (silk brand)  - only fruits are berries at less than 100-150 gm per day  - eat nuts daily  - no carrots or beets - download myfitnesspal app; weight loss goal 1 pound a week - drink lot of water - eat nuts daily (the ones in the left column)   #FOllowup 1 month for weight mgmt

## 2012-09-02 NOTE — Assessment & Plan Note (Signed)
#  ASthma  - stable, continue singulair an dulera  #Constipation  - likely related to diet but try docusate 100mg  tab twice daily  - drink lot of water  #Sinus drainge  - continue saline nasal irrigation daily  -  take generic fluticasone inhaler or OTC nasacort, 2 squirts each nostril daily  #Weight loss She has gained weight despite QSYMIA. I have a feeling is due to dietary indiscretion. We wnet over principles again  PLAN  - half of what goes in your mouth should be vegetables in the left column  - will give you diet sheet again; stick to foods in left column but no apples - cut down on kashi go lean to 1 to 1.5 cups per day  - change milk to unsweetened soy or lite vanilla soy (silk brand)  - only fruits are berries at less than 100-150 gm per day  - eat nuts daily  > 50% of this > 25 min visit spent in face to face counseling (15 min visit converted to 25 min)

## 2012-09-02 NOTE — Assessment & Plan Note (Signed)
Needs to low 100# before TKR

## 2012-09-02 NOTE — Assessment & Plan Note (Signed)
Stable. Continue dulera and singular

## 2012-09-08 DIAGNOSIS — M1710 Unilateral primary osteoarthritis, unspecified knee: Secondary | ICD-10-CM | POA: Diagnosis not present

## 2012-09-09 DIAGNOSIS — R7301 Impaired fasting glucose: Secondary | ICD-10-CM | POA: Diagnosis not present

## 2012-09-09 DIAGNOSIS — D509 Iron deficiency anemia, unspecified: Secondary | ICD-10-CM | POA: Diagnosis not present

## 2012-09-09 DIAGNOSIS — J45909 Unspecified asthma, uncomplicated: Secondary | ICD-10-CM | POA: Diagnosis not present

## 2012-09-09 DIAGNOSIS — Z0000 Encounter for general adult medical examination without abnormal findings: Secondary | ICD-10-CM | POA: Diagnosis not present

## 2012-09-09 DIAGNOSIS — Z23 Encounter for immunization: Secondary | ICD-10-CM | POA: Diagnosis not present

## 2012-09-09 DIAGNOSIS — K5900 Constipation, unspecified: Secondary | ICD-10-CM | POA: Diagnosis not present

## 2012-09-09 DIAGNOSIS — IMO0001 Reserved for inherently not codable concepts without codable children: Secondary | ICD-10-CM | POA: Diagnosis not present

## 2012-09-09 DIAGNOSIS — G4733 Obstructive sleep apnea (adult) (pediatric): Secondary | ICD-10-CM | POA: Diagnosis not present

## 2012-09-30 ENCOUNTER — Other Ambulatory Visit (HOSPITAL_COMMUNITY): Payer: Self-pay | Admitting: Internal Medicine

## 2012-09-30 DIAGNOSIS — Z1231 Encounter for screening mammogram for malignant neoplasm of breast: Secondary | ICD-10-CM

## 2012-10-08 ENCOUNTER — Ambulatory Visit (HOSPITAL_COMMUNITY): Payer: Medicare Other

## 2012-10-09 ENCOUNTER — Ambulatory Visit (HOSPITAL_COMMUNITY)
Admission: RE | Admit: 2012-10-09 | Discharge: 2012-10-09 | Disposition: A | Discharge status: Home / Self Care | Payer: Medicare Other | Source: Ambulatory Visit | Attending: Internal Medicine | Admitting: Internal Medicine

## 2012-10-09 DIAGNOSIS — Z1231 Encounter for screening mammogram for malignant neoplasm of breast: Secondary | ICD-10-CM | POA: Diagnosis not present

## 2012-10-21 ENCOUNTER — Ambulatory Visit: Payer: Medicare Other | Admitting: Internal Medicine

## 2012-10-21 DIAGNOSIS — Z23 Encounter for immunization: Secondary | ICD-10-CM | POA: Diagnosis not present

## 2012-10-21 DIAGNOSIS — J45909 Unspecified asthma, uncomplicated: Secondary | ICD-10-CM | POA: Diagnosis not present

## 2012-10-21 DIAGNOSIS — Z6841 Body Mass Index (BMI) 40.0 and over, adult: Secondary | ICD-10-CM | POA: Diagnosis not present

## 2012-11-06 DIAGNOSIS — M1710 Unilateral primary osteoarthritis, unspecified knee: Secondary | ICD-10-CM | POA: Diagnosis not present

## 2012-11-06 DIAGNOSIS — M25569 Pain in unspecified knee: Secondary | ICD-10-CM | POA: Diagnosis not present

## 2012-11-25 ENCOUNTER — Ambulatory Visit: Payer: Medicare Other | Admitting: Internal Medicine

## 2012-12-04 DIAGNOSIS — M25569 Pain in unspecified knee: Secondary | ICD-10-CM | POA: Diagnosis not present

## 2012-12-04 DIAGNOSIS — M1710 Unilateral primary osteoarthritis, unspecified knee: Secondary | ICD-10-CM | POA: Diagnosis not present

## 2012-12-25 ENCOUNTER — Ambulatory Visit (INDEPENDENT_AMBULATORY_CARE_PROVIDER_SITE_OTHER): Payer: Medicare Other | Admitting: Internal Medicine

## 2012-12-25 ENCOUNTER — Encounter: Payer: Self-pay | Admitting: Internal Medicine

## 2012-12-25 VITALS — BP 118/80 | HR 93 | Temp 98.1°F | Ht 66.0 in | Wt 371.8 lb

## 2012-12-25 DIAGNOSIS — J45901 Unspecified asthma with (acute) exacerbation: Secondary | ICD-10-CM

## 2012-12-25 MED ORDER — ALBUTEROL SULFATE (2.5 MG/3ML) 0.083% IN NEBU
2.5000 mg | INHALATION_SOLUTION | Freq: Once | RESPIRATORY_TRACT | Status: AC
  Administered 2012-12-25: 2.5 mg via RESPIRATORY_TRACT

## 2012-12-25 MED ORDER — LEVOFLOXACIN 500 MG PO TABS: 500.0000 mg | ORAL_TABLET | Freq: Every day | ORAL | Status: DC

## 2012-12-25 MED ORDER — METHYLPREDNISOLONE ACETATE 80 MG/ML IJ SUSP
120.0000 mg | Freq: Once | INTRAMUSCULAR | Status: AC
  Administered 2012-12-25: 120 mg via INTRAMUSCULAR

## 2012-12-25 MED ORDER — PREDNISONE 10 MG PO TABS: ORAL_TABLET | ORAL | Status: DC

## 2012-12-25 NOTE — Patient Instructions (Addendum)
ASthma in flare up due to virus or bacteria or both with/without sinus infection Have nebulizer xopnex/albuterol x 1 in office IM depot medrol 120mg x 1 in office Please take Take prednisone 40mg once daily x 3 days, then 30mg once daily x 3 days, then 20mg once daily x 3 days, then prednisone 10mg once daily  x 3 days and stop Take levaquin 500mg once daily  X 8 days Continue singulair daily Continue dulera 2 puff twice daily Use albuterol as needed If not better, go to ER [place] in 10 days to see Tammy to ensure you are better 

## 2012-12-25 NOTE — Progress Notes (Signed)
Subjective:    Patient ID: Melissa Rowe, female    DOB: 07/19/1965, 47 y.o.   MRN: [id number] PI 47 yo AAF morbidly obese with known hx of Asthma, OSA on CPAP   Dyspnea due to Obesity and Asthma -> 05/05/2008: Spirometry suggests restriction. CXR 04/19/2008  Normal. CT 02/10/2008  - Neg for PE. No infiltrates -> Methacholine Challenge Test: 06/25/2008: Positive PC20 between 1-4  - 10/11/2009: fev1 2L/70% -> start pulmicort  -11/08/2009: fev1 2.24L/78% and better - > switch to QVAR due to cost - May 2012: fev1 2.32L/85% and normal but symptomactic -> change QVAR to dulera sample,  6day pred burst - Jul 13, 2010: Fev1 2.5L/94%, FVC 3.21/98% and improved -> continue dulera -10/26/2010 FEV1 2.33 L/87%, FVC 2.87 L/88% >cont on dulera  - med calendar 10/26/10     HPI Acute visit 12/25/2012 This is a routine followup for asthma but she is acutely ill. 5 days ago her daughter or the birth to a child and she is exposed to many people. The following day she started having fever with sinus congestion, headaches, wheezing, cough, yellow sputum. Symptoms are persisted unchanged. Fever was associated with chills but finally broke 2-3 days ago. However other symptoms still persist along with dyspnea. Initially progressive but no stable but still significant enough that when she walks she is extremely dyspneic. She feels like she needs a nebulizer treatment in the office but she's not taken her for admission.  There no other new issues   Review of Systems  Constitutional: Negative for fever and unexpected weight change.  HENT: Positive for congestion. Negative for dental problem, ear pain, nosebleeds, postnasal drip, rhinorrhea, sinus pressure, sneezing, sore throat and trouble swallowing.   Eyes: Negative for redness and itching.  Respiratory: Positive for cough, shortness of breath and wheezing. Negative for chest tightness.   Cardiovascular: Negative for palpitations and leg swelling.   Gastrointestinal: Negative for nausea and vomiting.  Genitourinary: Negative for dysuria.  Musculoskeletal: Negative for joint swelling.  Skin: Negative for rash.  Neurological: Negative for headaches.  Hematological: Does not bruise/bleed easily.  Psychiatric/Behavioral: Negative for dysphoric mood. The patient is not nervous/anxious.        Objective:   Physical Exam  Vitals reviewed. Constitutional: She is oriented to person, place, and time. She appears well-developed and well-nourished. No distress.  Body mass index is 60.04 kg/(m^2). Huffing and puffing when she walked in  HENT:  Head: Normocephalic and atraumatic.  Right Ear: External ear normal.  Left Ear: External ear normal.  Mouth/Throat: Oropharynx is clear and moist. No oropharyngeal exudate.  Eyes: Conjunctivae and EOM are normal. Pupils are equal, round, and reactive to light. Right eye exhibits no discharge. Left eye exhibits no discharge. No scleral icterus.  Neck: Normal range of motion. Neck supple. No JVD present. No tracheal deviation present. No thyromegaly present.  Cardiovascular: Normal rate, regular rhythm, normal heart sounds and intact distal pulses.  Exam reveals no gallop and no friction rub.   No murmur heard. Pulmonary/Chest: Effort normal and breath sounds normal. No respiratory distress. She has no wheezes. She has no rales. She exhibits no tenderness.  No resp distress but subjectivel;y feels so  Abdominal: Soft. Bowel sounds are normal. She exhibits no distension and no mass. There is no tenderness. There is no rebound and no guarding.  Musculoskeletal: Normal range of motion. She exhibits no edema and no tenderness.  Lymphadenopathy:    She has no cervical adenopathy.  Neurological: She is alert  and oriented to person, place, and time. She has normal reflexes. No cranial nerve deficit. She exhibits normal muscle tone. Coordination normal.  Skin: Skin is warm and dry. No rash noted. She is not  diaphoretic. No erythema. No pallor.  Psychiatric: She has a normal mood and affect. Her behavior is normal. Judgment and thought content normal.          Assessment & Plan:

## 2012-12-28 DIAGNOSIS — J45901 Unspecified asthma with (acute) exacerbation: Secondary | ICD-10-CM | POA: Insufficient documentation

## 2012-12-28 NOTE — Assessment & Plan Note (Signed)
ASthma in flare up due to virus or bacteria or both with/without sinus infection Have nebulizer xopnex/albuterol x 1 in office IM depot medrol 120mg  x 1 in office Please take Take prednisone 40mg  once daily x 3 days, then 30mg  once daily x 3 days, then 20mg  once daily x 3 days, then prednisone 10mg  once daily  x 3 days and stop Take levaquin 500mg  once daily  X 8 days Continue singulair daily Continue dulera 2 puff twice daily Use albuterol as needed If not better, go to ER [place] in 10 days to see Tammy to ensure you are better

## 2013-01-05 ENCOUNTER — Encounter: Payer: Self-pay | Admitting: Adult Health

## 2013-01-05 ENCOUNTER — Ambulatory Visit (INDEPENDENT_AMBULATORY_CARE_PROVIDER_SITE_OTHER): Payer: Medicare Other | Admitting: Adult Health

## 2013-01-05 VITALS — BP 112/68 | HR 112 | Temp 99.0°F | Ht 65.5 in | Wt 371.0 lb

## 2013-01-05 DIAGNOSIS — J45901 Unspecified asthma with (acute) exacerbation: Secondary | ICD-10-CM

## 2013-01-05 NOTE — Progress Notes (Signed)
Subjective:    Patient ID: Melissa Rowe, female    DOB: 1965/02/28,   MRN: [id number] PI 47 yo AAF morbidly obese with known hx of Asthma, OSA on CPAP   Dyspnea due to Obesity and Asthma -> 05/05/2008: Spirometry suggests restriction. CXR 04/19/2008  Normal. CT 02/10/2008  - Neg for PE. No infiltrates -> Methacholine Challenge Test: 06/25/2008: Positive PC20 between 1-4  - 10/11/2009: fev1 2L/70% -> start pulmicort  -11/08/2009: fev1 2.24L/78% and better - > switch to QVAR due to cost - May 2012: fev1 2.32L/85% and normal but symptomactic -> change QVAR to dulera sample,  6day pred burst - Jul 13, 2010: Fev1 2.5L/94%, FVC 3.21/98% and improved -> continue dulera -10/26/2010 FEV1 2.33 L/87%, FVC 2.87 L/88% >cont on dulera  - med calendar 10/26/10    HPI Acute visit 12/25/2012 This is a routine followup for asthma but she is acutely ill. 5 days ago her daughter or the birth to a child and she is exposed to many people. The following day she started having fever with sinus congestion, headaches, wheezing, cough, yellow sputum. Symptoms are persisted unchanged. Fever was associated with chills but finally broke 2-3 days ago. However other symptoms still persist along with dyspnea. Initially progressive but no stable but still significant enough that when she walks she is extremely dyspneic. She feels like she needs a nebulizer treatment in the office but she's not taken her for admission. >>Levaquin and Steroid taper /Depo Medrol   01/05/2013 Follow up  Pt presents for a follow up . Seen 2 weeks ago with asthmatic Bronchitis , tx/ Levaquin and steroid taper .  Reports breathing is 75% better since last ov.  still having some chest congestion, cough, wheezing, chest tightness, head congestion, PND.  finished Levaquin, has 2 days left on pred taper.  Feels she has a lot of sinus drainage.  Patient denies any hemoptysis, chest pain, orthopnea, PND, or increased leg swelling.  There no other new  issues   Review of Systems  Constitutional: Negative for fever and unexpected weight change.  HENT: Positive for congestion. Negative for dental problem, ear pain, nosebleeds, postnasal drip, rhinorrhea, sinus pressure, sneezing, sore throat and trouble swallowing.   Eyes: Negative for redness and itching.  Respiratory: Positive for cough, shortness of breath and wheezing. Negative for chest tightness.   Cardiovascular: Negative for palpitations and leg swelling.  Gastrointestinal: Negative for nausea and vomiting.  Genitourinary: Negative for dysuria.  Musculoskeletal: Negative for joint swelling.  Skin: Negative for rash.  Neurological: Negative for headaches.  Hematological: Does not bruise/bleed easily.  Psychiatric/Behavioral: Negative for dysphoric mood. The patient is not nervous/anxious.        Objective:   Physical Exam  GEN: A/Ox3; pleasant , NAD, morbidly obese   HEENT:  [place]/AT,  EACs-clear, TMs-wnl, NOSE-clear, THROAT-clear, no lesions, no postnasal drip or exudate noted.   NECK:  Supple w/ fair ROM; no JVD; normal carotid impulses w/o bruits; no thyromegaly or nodules palpated; no lymphadenopathy.  RESP  Diminished BS in bases ,  w/o, wheezes/ rales/ or rhonchi.no accessory muscle use, no dullness to percussion  CARD:  RRR, no m/r/g  , tr  peripheral edema, pulses intact, no cyanosis or clubbing.  GI:   Soft & nt; nml bowel sounds; no organomegaly or masses detected.  Musco: Warm bil, no deformities or joint swelling noted.   Neuro: alert, no focal deficits noted.    Skin: Warm, no lesions or rashes  Assessment & Plan:

## 2013-01-05 NOTE — Assessment & Plan Note (Signed)
Reason exacerbation, now resolving. Plan Finish Prednisone taper as directed.  Continue on Dulera 2 puffs Twice daily  , rinse after use.  Dymista Nasal  2 puffs daily until sample is gone  Afrin Nasal spray 2 puffs Twice daily  For 5 days .  Mucinex DM Twice daily  As needed  Cough/congestion  Saline nasal rinses As needed   Fluids and rest  Use sugarless candy to sooth throat, NO MINTS.  Please contact office for sooner follow up if symptoms do not improve or worsen or seek emergency care  Follow up Dr. Marchelle Gearing in 6 weeks  And As needed

## 2013-01-05 NOTE — Patient Instructions (Addendum)
Finish Prednisone taper as directed.  Continue on Dulera 2 puffs Twice daily  , rinse after use.  Dymista Nasal  2 puffs daily until sample is gone  Afrin Nasal spray 2 puffs Twice daily  For 5 days .  Mucinex DM Twice daily  As needed  Cough/congestion  Saline nasal rinses As needed   Fluids and rest  Use sugarless candy to sooth throat, NO MINTS.  Please contact office for sooner follow up if symptoms do not improve or worsen or seek emergency care  Follow up Dr. Marchelle Gearing in 6 weeks  And As needed

## 2013-01-19 ENCOUNTER — Encounter: Payer: Self-pay | Admitting: Gastroenterology

## 2013-01-26 ENCOUNTER — Ambulatory Visit (INDEPENDENT_AMBULATORY_CARE_PROVIDER_SITE_OTHER): Payer: Medicare Other | Admitting: Gastroenterology

## 2013-01-26 ENCOUNTER — Encounter: Payer: Self-pay | Admitting: Gastroenterology

## 2013-01-26 VITALS — BP 122/80 | HR 97 | Ht 66.14 in | Wt 370.4 lb

## 2013-01-26 DIAGNOSIS — D509 Iron deficiency anemia, unspecified: Secondary | ICD-10-CM | POA: Diagnosis not present

## 2013-01-26 DIAGNOSIS — K921 Melena: Secondary | ICD-10-CM | POA: Diagnosis not present

## 2013-01-26 DIAGNOSIS — K5900 Constipation, unspecified: Secondary | ICD-10-CM | POA: Diagnosis not present

## 2013-01-26 MED ORDER — PEG-KCL-NACL-NASULF-NA ASC-C 100 G PO SOLR: 1.0000 | Freq: Once | ORAL | Status: DC

## 2013-01-26 NOTE — Progress Notes (Addendum)
  History of Present Illness: This is a 47-year-old female who relates chronic problems with constipation for 10-15 years and intermittent rectal bleeding for the past 3-4 years. She states she had recurrent rectal bleeding in 2001 or 2002 and underwent colonoscopy at [facility]. She states she was told of hemorrhoids. She notes small amounts of bright red blood per rectum and occasionally she has had clots per rectum. She was evaluated by Dr. Shaw for these complaints in November by her report but unfortunately do not have the offices available today. She's been taking MiraLax once daily without good results. She was placed on hemorrhoidal cream by Dr. Shaw and her rectal bleeding has recently abated. She was found to have an iron deficiency anemia with hemoglobin of 11.1 MC 64.1 iron saturation 12% and a low normal ferritin of 37.7. Denies weight loss, abdominal pain, diarrhea, change in stool caliber, melena, nausea, vomiting, dysphagia, reflux symptoms, chest pain.   No Known Allergies Outpatient Prescriptions Prior to Visit  Medication Sig Dispense Refill  . albuterol (PROVENTIL HFA;VENTOLIN HFA) 108 (90 BASE) MCG/ACT inhaler Inhale 2 puffs into the lungs every 6 (six) hours as needed. For shortness of breath.      . calcium carbonate (OS-CAL) 600 MG TABS Take 600 mg by mouth daily.       . cyclobenzaprine (FLEXERIL) 10 MG tablet Take 10 mg by mouth 2 (two) times daily as needed. For muscle spasms      . HYDROcodone-homatropine (HYDROMET) 5-1.5 MG/5ML syrup Take 5 mLs by mouth every 6 (six) hours as needed for cough.  240 mL  0  . levothyroxine (SYNTHROID, LEVOTHROID) 50 MCG tablet Take 50 mcg by mouth daily.      . Mometasone Furo-Formoterol Fum 200-5 MCG/ACT AERO Inhale 2 puffs into the lungs 2 (two) times daily.      . montelukast (SINGULAIR) 10 MG tablet Take 1 tablet (10 mg total) by mouth daily.  30 tablet  5  . Multiple Vitamin (MULTIVITAMIN) capsule Take 1 capsule by  mouth daily.        . Omeprazole-Sodium Bicarbonate (ZEGERID OTC PO) Take 1 tablet by mouth daily.      . Phentermine-Topiramate (QSYMIA) 3.75-23 MG CP24 Take 1 tablet by mouth daily.      . polyethylene glycol (MIRALAX / GLYCOLAX) packet Take 17 g by mouth daily.      . spironolactone (ALDACTONE) 25 MG tablet Take 1 tablet by mouth daily.      . tolterodine (DETROL LA) 2 MG 24 hr capsule Take 2 mg by mouth daily.      . traMADol (ULTRAM) 50 MG tablet Take 1 tablet by mouth daily.      . predniSONE (DELTASONE) 10 MG tablet Take 4 tabs daily x 3 days, 3 tabs daily x 3, 2 tabs daily x 3, 1 tab x 3 days, then stop  30 tablet  0   No facility-administered medications prior to visit.   Past Medical History  Diagnosis Date  . Dyspnea   . Gastroenteritis   . Hyperlipidemia   . Diverticulitis   . Atrial mass   . OSA (obstructive sleep apnea)     wears CPAP night  . Hypothyroidism   . Asthma   . Urgency of urination   . Frequency of urination   . GERD (gastroesophageal reflux disease)   . Arthritis   . Environmental allergies   . Obesity    Past Surgical History  Procedure Laterality Date  .   Cholecystectomy  2008  . Polyps removal  2007  . Tubal ligation  1992  . Carpal tunnel release      right  . Colonoscopy    . Knee arthroscopy  03/18/2012    Procedure: ARTHROSCOPY KNEE;  Surgeon: Christopher Y Blackman, MD;  Location: [facility] OR;  Service: Orthopedics;  Laterality: Right;  Right knee arthroscopy with debridement and three compartment chondroplasty   History   Social History  . Marital Status: Married    Spouse Name: N/A    Number of Children: 2  . Years of Education: N/A   Occupational History  . 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     Social History Main Topics  . Smoking status: Former Smoker -- 2.00 packs/day for 7 years    Types: Cigarettes    Quit date: 02/12/1990  . Smokeless tobacco: Never Used  . Alcohol Use: Yes     Comment: rare  . Drug  Use: No  . Sexual Activity: None   Other Topics Concern  . None   Social History Narrative  . None   Family History  Problem Relation Age of Onset  . Heart murmur Mother     rheumatoid arthritis  . Asthma Brother     and mother both as a child    Review of Systems: Pertinent positive and negative review of systems were noted in the above HPI section. All other review of systems were otherwise negative.  Physical Exam: General: Well developed , well nourished, morbid obesity, no acute distress Head: Normocephalic and atraumatic Eyes:  sclerae anicteric, EOMI Ears: Normal auditory acuity Mouth: No deformity or lesions Neck: Supple, no masses or thyromegaly Lungs: Clear throughout to auscultation Heart: Regular rate and rhythm; no murmurs, rubs or bruits Abdomen: Soft, large, non tender and non distended. No masses, hepatosplenomegaly or hernias noted. Normal Bowel sounds Rectal: Deferred to colonoscopy, patient reports recent exam by Dr. Shaw showing hemorrhoids Musculoskeletal: Symmetrical with no gross deformities  Skin: No lesions on visible extremities Pulses:  Normal pulses noted Extremities: No clubbing, cyanosis, edema or deformities noted Neurological: Alert oriented x 4, grossly nonfocal Cervical Nodes:  No significant cervical adenopathy Inguinal Nodes: No significant inguinal adenopathy Psychological:  Alert and cooperative. Normal mood and affect  Assessment and Recommendations:  1. Hematochezia and constipation. Presumed functional constipation and hemorrhoidal bleeding. Rule out colorectal neoplasms and other disorders. Increase MiraLax to 2 or 3 times per day and increase daily dietary fiber and water intake. Schedule colonoscopy at [facility] due to comorbidities. The risks, benefits, and alternatives to colonoscopy with possible biopsy, possible destruction of internal hemorrhoids and possible polypectomy were discussed with the patient and they  consent to proceed.   2. Fe def anemia. Further evaluation with colonoscopy as above. Begin iron replacement.  3. OSA, CPAP at night.  4. Morbid obesity, BMI=59.5  5. GERD. Continue omeprazole standard antireflux measures.   02/16/2013. Records received and reviewed from 10/14/2003 colonoscopy by T. Keith Toledo, MD showing moderate internal,large external hemorrhoids and diverticulosis. 

## 2013-01-26 NOTE — Patient Instructions (Signed)
You have been scheduled for a colonoscopy with propofol. Please follow written instructions given to you at your visit today.  Please pick up your prep kit at the pharmacy within the next 1-3 days. If you use inhalers (even only as needed), please bring them with you on the day of your procedure.  Increase your Miralax to 2-3 x daily for constipation.   Increase your fiber and fluid intake to also help with constipation.   Thank you for choosing me and [facility].  Venita Lick. Pleas Koch., MD., Clementeen Graham  cc: Buren Kos, MD

## 2013-02-02 ENCOUNTER — Encounter (HOSPITAL_COMMUNITY): Payer: Self-pay | Admitting: Pharmacy Technician

## 2013-02-03 ENCOUNTER — Encounter (HOSPITAL_COMMUNITY): Payer: Self-pay | Admitting: *Deleted

## 2013-02-16 ENCOUNTER — Ambulatory Visit: Payer: Medicare Other | Admitting: Internal Medicine

## 2013-02-23 DIAGNOSIS — M1710 Unilateral primary osteoarthritis, unspecified knee: Secondary | ICD-10-CM | POA: Diagnosis not present

## 2013-02-24 ENCOUNTER — Ambulatory Visit (HOSPITAL_COMMUNITY)
Admission: RE | Admit: 2013-02-24 | Discharge: 2013-02-24 | Disposition: A | Discharge status: Home / Self Care | Payer: Medicare Other | Source: Ambulatory Visit | Attending: Gastroenterology | Admitting: Gastroenterology

## 2013-02-24 ENCOUNTER — Encounter (HOSPITAL_COMMUNITY): Payer: Self-pay | Admitting: *Deleted

## 2013-02-24 ENCOUNTER — Encounter (HOSPITAL_COMMUNITY)
Admission: RE | Disposition: A | Discharge status: Home / Self Care | Payer: Medicare Other | Source: Ambulatory Visit | Attending: Gastroenterology

## 2013-02-24 ENCOUNTER — Ambulatory Visit (HOSPITAL_COMMUNITY): Payer: Medicare Other | Admitting: Anesthesiology

## 2013-02-24 ENCOUNTER — Encounter (HOSPITAL_COMMUNITY): Payer: Medicare Other | Admitting: Anesthesiology

## 2013-02-24 DIAGNOSIS — E785 Hyperlipidemia, unspecified: Secondary | ICD-10-CM | POA: Insufficient documentation

## 2013-02-24 DIAGNOSIS — K648 Other hemorrhoids: Secondary | ICD-10-CM | POA: Diagnosis not present

## 2013-02-24 DIAGNOSIS — K219 Gastro-esophageal reflux disease without esophagitis: Secondary | ICD-10-CM | POA: Insufficient documentation

## 2013-02-24 DIAGNOSIS — K625 Hemorrhage of anus and rectum: Secondary | ICD-10-CM | POA: Diagnosis not present

## 2013-02-24 DIAGNOSIS — R195 Other fecal abnormalities: Secondary | ICD-10-CM | POA: Diagnosis not present

## 2013-02-24 DIAGNOSIS — E039 Hypothyroidism, unspecified: Secondary | ICD-10-CM | POA: Insufficient documentation

## 2013-02-24 DIAGNOSIS — G4733 Obstructive sleep apnea (adult) (pediatric): Secondary | ICD-10-CM | POA: Insufficient documentation

## 2013-02-24 DIAGNOSIS — J45909 Unspecified asthma, uncomplicated: Secondary | ICD-10-CM | POA: Insufficient documentation

## 2013-02-24 DIAGNOSIS — D509 Iron deficiency anemia, unspecified: Secondary | ICD-10-CM | POA: Diagnosis not present

## 2013-02-24 DIAGNOSIS — K921 Melena: Secondary | ICD-10-CM | POA: Diagnosis not present

## 2013-02-24 DIAGNOSIS — K5900 Constipation, unspecified: Secondary | ICD-10-CM | POA: Insufficient documentation

## 2013-02-24 DIAGNOSIS — Z6841 Body Mass Index (BMI) 40.0 and over, adult: Secondary | ICD-10-CM | POA: Insufficient documentation

## 2013-02-24 DIAGNOSIS — K5730 Diverticulosis of large intestine without perforation or abscess without bleeding: Secondary | ICD-10-CM | POA: Diagnosis not present

## 2013-02-24 HISTORY — DX: Unspecified hemorrhoids: K64.9

## 2013-02-24 HISTORY — PX: COLONOSCOPY WITH PROPOFOL: SHX5780

## 2013-02-24 HISTORY — DX: Anemia, unspecified: D64.9

## 2013-02-24 SURGERY — COLONOSCOPY WITH PROPOFOL
Anesthesia: Monitor Anesthesia Care

## 2013-02-24 MED ORDER — SODIUM CHLORIDE 0.9 % IV SOLN: INTRAVENOUS | Status: DC

## 2013-02-24 MED ORDER — PROPOFOL INFUSION 10 MG/ML OPTIME
INTRAVENOUS | Status: DC | PRN
  Administered 2013-02-24: 100 ug/kg/min via INTRAVENOUS

## 2013-02-24 MED ORDER — PROPOFOL 10 MG/ML IV BOLUS
INTRAVENOUS | Status: AC
  Filled 2013-02-24: qty 20

## 2013-02-24 MED ORDER — KETAMINE HCL 10 MG/ML IJ SOLN
INTRAMUSCULAR | Status: AC
  Filled 2013-02-24: qty 1

## 2013-02-24 MED ORDER — KETAMINE HCL 50 MG/ML IJ SOLN
INTRAMUSCULAR | Status: DC | PRN
  Administered 2013-02-24: 10 mg via INTRAMUSCULAR
  Administered 2013-02-24: 30 mg via INTRAMUSCULAR
  Administered 2013-02-24: 10 mg via INTRAMUSCULAR

## 2013-02-24 MED ORDER — LACTATED RINGERS IV SOLN
INTRAVENOUS | Status: DC
  Administered 2013-02-24: 1000 mL via INTRAVENOUS

## 2013-02-24 SURGICAL SUPPLY — 21 items

## 2013-02-24 NOTE — Discharge Instructions (Signed)
Colonoscopy, Care After °Refer to this sheet in the next few weeks. These instructions provide you with information on caring for yourself after your procedure. Your health care provider may also give you more specific instructions. Your treatment has been planned according to current medical practices, but problems sometimes occur. Call your health care provider if you have any problems or questions after your procedure. °WHAT TO EXPECT AFTER THE PROCEDURE  °After your procedure, it is typical to have the following: °· A small amount of blood in your stool. °· Moderate amounts of gas and mild abdominal cramping or bloating. °HOME CARE INSTRUCTIONS °· Do not drive, operate machinery, or sign important documents for 24 hours. °· You may shower and resume your regular physical activities, but move at a slower pace for the first 24 hours. °· Take frequent rest periods for the first 24 hours. °· Walk around or put a warm pack on your abdomen to help reduce abdominal cramping and bloating. °· Drink enough fluids to keep your urine clear or pale yellow. °· You may resume your normal diet as instructed by your health care provider. Avoid heavy or fried foods that are hard to digest. °· Avoid drinking alcohol for 24 hours or as instructed by your health care provider. °· Only take over-the-counter or prescription medicines as directed by your health care provider. °· If a tissue sample (biopsy) was taken during your procedure: °· Do not take aspirin or blood thinners for 7 days, or as instructed by your health care provider. °· Do not drink alcohol for 7 days, or as instructed by your health care provider. °· Eat soft foods for the first 24 hours. °SEEK MEDICAL CARE IF: °You have persistent spotting of blood in your stool 2 3 days after the procedure. °SEEK IMMEDIATE MEDICAL CARE IF: °· You have more than a small spotting of blood in your stool. °· You pass large blood clots in your stool. °· Your abdomen is swollen  (distended). °· You have nausea or vomiting. °· You have a fever. °· You have increasing abdominal pain that is not relieved with medicine. °Document Released: 09/13/2003 Document Revised: 11/19/2012 Document Reviewed: 10/06/2012 °[employer]® Patient Information ©2014 [employer]. ° °Monitored Anesthesia Care  °Monitored anesthesia care is an anesthesia service for a medical procedure. Anesthesia is the loss of the ability to feel pain. It is produced by medications called anesthetics. It may affect a small area of your body (local anesthesia), a large area of your body (regional anesthesia), or your entire body (general anesthesia). The need for monitored anesthesia care depends your procedure, your condition, and the potential need for regional or general anesthesia. It is often provided during procedures where:  °· General anesthesia may be needed if there are complications. This is because you need special care when you are under general anesthesia.   °· You will be under local or regional anesthesia. This is so that you are able to have higher levels of anesthesia if needed.   °· You will receive calming medications (sedatives). This is especially the case if sedatives are given to put you in a semi-conscious state of relaxation (deep sedation). This is because the amount of sedative needed to produce this state can be hard to predict. Too much of a sedative can produce general anesthesia. °Monitored anesthesia care is performed by one or more caregivers who have special training in all types of anesthesia. You will need to meet with these caregivers before your procedure. During this meeting, they   will ask you about your medical history. They will also give you instructions to follow. (For example, you will need to stop eating and drinking before your procedure. You may also need to stop or change medications you are taking.) During your procedure, your caregivers will stay with you. They will:  °· Watch your  condition. This includes watching you blood pressure, breathing, and level of pain.   °· Diagnose and treat problems that occur.   °· Give medications if they are needed. These may include calming medications (sedatives) and anesthetics.   °· Make sure you are comfortable.   °Having monitored anesthesia care does not necessarily mean that you will be under anesthesia. It does mean that your caregivers will be able to manage anesthesia if you need it or if it occurs. It also means that you will be able to have a different type of anesthesia than you are having if you need it. When your procedure is complete, your caregivers will continue to watch your condition. They will make sure any medications wear off before you are allowed to go home.  °Document Released: 10/25/2004 Document Revised: 05/26/2012 Document Reviewed: 03/12/2012 °Exit[employer]® Patient Information ©2014 [employer]. ° °

## 2013-02-24 NOTE — H&P (View-Only) (Signed)
History of Present Illness: This is a 48 year old female who relates chronic problems with constipation for 10-15 years and intermittent rectal bleeding for the past 3-4 years. She states she had recurrent rectal bleeding in 2001 or 2002 and underwent colonoscopy at [facility] gastroenterology. She states she was told of hemorrhoids. She notes small amounts of bright red blood per rectum and occasionally she has had clots per rectum. She was evaluated by Dr. [name] for these complaints in November by her report but unfortunately do not have the offices available today. She's been taking MiraLax once daily without good results. She was placed on hemorrhoidal cream by Dr. [name] and her rectal bleeding has recently abated. She was found to have an iron deficiency anemia with hemoglobin of 11.1 MC 64.1 iron saturation 12% and a low normal ferritin of 37.7. Denies weight loss, abdominal pain, diarrhea, change in stool caliber, melena, nausea, vomiting, dysphagia, reflux symptoms, chest pain.   No Known Allergies Outpatient Prescriptions Prior to Visit  Medication Sig Dispense Refill  . albuterol (PROVENTIL HFA;VENTOLIN HFA) 108 (90 BASE) MCG/ACT inhaler Inhale 2 puffs into the lungs every 6 (six) hours as needed. For shortness of breath.      . calcium carbonate (OS-CAL) 600 MG TABS Take 600 mg by mouth daily.       . cyclobenzaprine (FLEXERIL) 10 MG tablet Take 10 mg by mouth 2 (two) times daily as needed. For muscle spasms      . HYDROcodone-homatropine (HYDROMET) 5-1.5 MG/5ML syrup Take 5 mLs by mouth every 6 (six) hours as needed for cough.  240 mL  0  . levothyroxine (SYNTHROID, LEVOTHROID) 50 MCG tablet Take 50 mcg by mouth daily.      . Mometasone Furo-Formoterol Fum 200-5 MCG/ACT AERO Inhale 2 puffs into the lungs 2 (two) times daily.      . montelukast (SINGULAIR) 10 MG tablet Take 1 tablet (10 mg total) by mouth daily.  30 tablet  5  . Multiple Vitamin (MULTIVITAMIN) capsule Take 1 capsule by  mouth daily.        Earney Navy Bicarbonate (ZEGERID OTC PO) Take 1 tablet by mouth daily.      . Phentermine-Topiramate (QSYMIA) 3.75-23 MG CP24 Take 1 tablet by mouth daily.      . polyethylene glycol (MIRALAX / GLYCOLAX) packet Take 17 g by mouth daily.      [name] spironolactone (ALDACTONE) 25 MG tablet Take 1 tablet by mouth daily.      [name] tolterodine (DETROL LA) 2 MG 24 hr capsule Take 2 mg by mouth daily.      . traMADol (ULTRAM) 50 MG tablet Take 1 tablet by mouth daily.      . predniSONE (DELTASONE) 10 MG tablet Take 4 tabs daily x 3 days, 3 tabs daily x 3, 2 tabs daily x 3, 1 tab x 3 days, then stop  30 tablet  0   No facility-administered medications prior to visit.   Past Medical History  Diagnosis Date  . Dyspnea   . Gastroenteritis   . Hyperlipidemia   . Diverticulitis   . Atrial mass   . OSA (obstructive sleep apnea)     wears CPAP night  . Hypothyroidism   . Asthma   . Urgency of urination   . Frequency of urination   . GERD (gastroesophageal reflux disease)   . Arthritis   . Environmental allergies   . Obesity    Past Surgical History  Procedure Laterality Date  .  Cholecystectomy  2008  . Polyps removal  2007  . Tubal ligation  1992  . Carpal tunnel release      right  . Colonoscopy    . Knee arthroscopy  03/18/2012    Procedure: ARTHROSCOPY KNEE;  Surgeon: Mcarthur Rossetti, MD;  Location: [facility];  Service: Orthopedics;  Laterality: Right;  Right knee arthroscopy with debridement and three compartment chondroplasty   History   Social History  . Marital Status: Married    Spouse Name: N/A    Number of Children: 2  . Years of Education: N/A   Occupational History  . 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     Social History Main Topics  . Smoking status: Former Smoker -- 2.00 packs/day for 7 years    Types: Cigarettes    Quit date: 02/12/1990  . Smokeless tobacco: Never Used  . Alcohol Use: Yes     Comment: rare  . Drug  Use: No  . Sexual Activity: None   Other Topics Concern  . None   Social History Narrative  . None   Family History  Problem Relation Age of Onset  . Heart murmur Mother     rheumatoid arthritis  . Asthma Brother     and mother both as a child    Review of Systems: Pertinent positive and negative review of systems were noted in the above HPI section. All other review of systems were otherwise negative.  Physical Exam: General: Well developed , well nourished, morbid obesity, no acute distress Head: Normocephalic and atraumatic Eyes:  sclerae anicteric, EOMI Ears: Normal auditory acuity Mouth: No deformity or lesions Neck: Supple, no masses or thyromegaly Lungs: Clear throughout to auscultation Heart: Regular rate and rhythm; no murmurs, rubs or bruits Abdomen: Soft, large, non tender and non distended. No masses, hepatosplenomegaly or hernias noted. Normal Bowel sounds Rectal: Deferred to colonoscopy, patient reports recent exam by Dr. [name] showing hemorrhoids Musculoskeletal: Symmetrical with no gross deformities  Skin: No lesions on visible extremities Pulses:  Normal pulses noted Extremities: No clubbing, cyanosis, edema or deformities noted Neurological: Alert oriented x 4, grossly nonfocal Cervical Nodes:  No significant cervical adenopathy Inguinal Nodes: No significant inguinal adenopathy Psychological:  Alert and cooperative. Normal mood and affect  Assessment and Recommendations:  1. Hematochezia and constipation. Presumed functional constipation and hemorrhoidal bleeding. Rule out colorectal neoplasms and other disorders. Increase MiraLax to 2 or 3 times per day and increase daily dietary fiber and water intake. Schedule colonoscopy at [facility] due to comorbidities. The risks, benefits, and alternatives to colonoscopy with possible biopsy, possible destruction of internal hemorrhoids and possible polypectomy were discussed with the patient and they  consent to proceed.   2. Fe def anemia. Further evaluation with colonoscopy as above. Begin iron replacement.  3. OSA, CPAP at night.  4. Morbid obesity, BMI=59.5  5. GERD. Continue omeprazole standard antireflux measures.   02/16/2013. Records received and reviewed from 10/14/2003 colonoscopy by T. Madolyn Frieze, MD showing moderate internal,large external hemorrhoids and diverticulosis.

## 2013-02-24 NOTE — Anesthesia Postprocedure Evaluation (Signed)
Anesthesia Post Note  Patient: Melissa Rowe  Procedure(s) Performed: Procedure(s) (LRB): COLONOSCOPY WITH PROPOFOL (N/A)  Anesthesia type: MAC  Patient location: PACU  Post pain: Pain level controlled  Post assessment: Post-op Vital signs reviewed  Last Vitals: BP 151/96  Temp(Src) 36.6 C (Oral)  Resp 15  Ht 5\' 6"  (1.676 m)  Wt 370 lb (167.831 kg)  BMI 59.75 kg/m2  SpO2 100%  LMP 01/23/2013  Post vital signs: Reviewed  Level of consciousness: awake  Complications: No apparent anesthesia complications

## 2013-02-24 NOTE — Interval H&P Note (Signed)
History and Physical Interval Note:  02/24/2013 12:01 PM  Melissa Rowe  has presented today for surgery, with the diagnosis of Blood in stool, Constiation, Iron def anemia  The various methods of treatment have been discussed with the patient and family. After consideration of risks, benefits and other options for treatment, the patient has consented to  Procedure(s): COLONOSCOPY WITH PROPOFOL (N/A) as a surgical intervention .  The patient's history has been reviewed, patient examined, no change in status, stable for surgery.  I have reviewed the patient's chart and labs.  Questions were answered to the patient's satisfaction.     Pricilla Riffle. Fuller Plan MD Mar[facility] Regal

## 2013-02-24 NOTE — Anesthesia Preprocedure Evaluation (Addendum)
Anesthesia Evaluation  Patient identified by MRN, date of birth, ID band Patient awake    Reviewed: Allergy & Precautions, H&P , NPO status , Patient's Chart, lab work & pertinent test results  Airway Mallampati: II TM Distance: >3 FB Neck ROM: full    Dental  (+) Dental Advisory Given   Pulmonary shortness of breath, asthma , sleep apnea , former smoker,  breath sounds clear to auscultation        Cardiovascular negative cardio ROS  Rhythm:Regular Rate:Normal     Neuro/Psych negative neurological ROS  negative psych ROS   GI/Hepatic Neg liver ROS, GERD-  ,  Endo/Other  Hypothyroidism Morbid obesity  Renal/GU negative Renal ROS     Musculoskeletal  (+) Arthritis -,   Abdominal   Peds  Hematology  (+) anemia ,   Anesthesia Other Findings   Reproductive/Obstetrics                           Anesthesia Physical  Anesthesia Plan  ASA: IV  Anesthesia Plan: MAC   Post-op Pain Management:    Induction: Intravenous  Airway Management Planned:   Additional Equipment:   Intra-op Plan:   Post-operative Plan:   Informed Consent: I have reviewed the patients History and Physical, chart, labs and discussed the procedure including the risks, benefits and alternatives for the proposed anesthesia with the patient or authorized representative who has indicated his/her understanding and acceptance.   Dental advisory given  Plan Discussed with: CRNA and Surgeon  Anesthesia Plan Comments:        Anesthesia Quick Evaluation

## 2013-02-24 NOTE — Op Note (Signed)
[facility] [place], [address]   COLONOSCOPY PROCEDURE REPORT  PATIENT: Melissa Rowe, Melissa Rowe  MR#: [id number] BIRTHDATE: Jul 25, 1965 , 47  yrs. old GENDER: Female ENDOSCOPIST: Ladene Artist, MD, [facility] REFERRED BY:W.  Lutricia Feil, M.D. PROCEDURE DATE:  02/24/2013 PROCEDURE:   Colonoscopy, diagnostic First Screening Colonoscopy - Avg.  risk and is 50 yrs.  old or older - No.  Prior Negative Screening - Now for repeat screening. N/A  History of Adenoma - Now for follow-up colonoscopy & has been > or = to 3 yrs.  N/A  Polyps Removed Today? No.  Recommend repeat exam, <10 yrs? No. ASA CLASS:   Class III INDICATIONS:hematochezia and Iron Deficiency Anemia. MEDICATIONS: MAC sedation, administered by CRNA DESCRIPTION OF PROCEDURE:   After the risks benefits and alternatives of the procedure were thoroughly explained, informed consent was obtained.  A digital rectal exam revealed no abnormalities of the rectum.   The Pentax Ped Colon [id number] endoscope was introduced through the anus and advanced to the cecum, which was identified by both the appendix and ileocecal valve. No adverse events experienced.   The quality of the prep was excellent, using MoviPrep  The instrument was then slowly withdrawn as the colon was fully examined.  COLON FINDINGS: Moderate diverticulosis was noted in the descending colon and sigmoid colon.   The colon was otherwise normal.  There was no diverticulosis, inflammation, polyps or cancers unless previously stated. Retroflexed views revealed moderate internal hemorrhoids. The time to cecum=4 minutes 00 seconds.  Withdrawal time=10 minutes 00 seconds.  The scope was withdrawn and the procedure completed. COMPLICATIONS: There were no complications.  ENDOSCOPIC IMPRESSION: 1.   Moderate diverticulosis in the descending colon and sigmoid colon 2.   Moderate internal hemorrhoids  RECOMMENDATIONS: 1.  High fiber diet with liberal  fluid intake. 2.  No cause for fe deficiency found. If her fe deficiency does not correct or recurs consider further GI evaluation 3.  You should continue to follow colorectal cancer screening guidelines for "routine risk" patients with a repeat colonoscopy in 10 years.  There is no need for routine screening FOBT (stool) testing for at least 5 years.  eSigned:  Ladene Artist, MD, [facility] 02/24/2013 2:07 PM

## 2013-02-24 NOTE — Transfer of Care (Signed)
Immediate Anesthesia Transfer of Care Note  Patient: Melissa Rowe  Procedure(s) Performed: Procedure(s) (LRB): COLONOSCOPY WITH PROPOFOL (N/A)  Patient Location: PACU  Anesthesia Type: MAC  Level of Consciousness: sedated, patient cooperative and responds to stimulation  Airway & Oxygen Therapy: Patient Spontanous Breathing and Patient connected to face mask oxgen  Post-op Assessment: Report given to PACU RN and Post -op Vital signs reviewed and stable  Post vital signs: Reviewed and stable  Complications: No apparent anesthesia complications

## 2013-02-25 ENCOUNTER — Encounter (HOSPITAL_COMMUNITY): Payer: Self-pay | Admitting: Gastroenterology

## 2013-03-13 ENCOUNTER — Ambulatory Visit: Payer: Medicare Other | Admitting: Internal Medicine

## 2013-03-24 DIAGNOSIS — R7301 Impaired fasting glucose: Secondary | ICD-10-CM | POA: Diagnosis not present

## 2013-03-24 DIAGNOSIS — H538 Other visual disturbances: Secondary | ICD-10-CM | POA: Diagnosis not present

## 2013-03-24 DIAGNOSIS — K649 Unspecified hemorrhoids: Secondary | ICD-10-CM | POA: Diagnosis not present

## 2013-03-24 DIAGNOSIS — E039 Hypothyroidism, unspecified: Secondary | ICD-10-CM | POA: Diagnosis not present

## 2013-03-24 DIAGNOSIS — G4733 Obstructive sleep apnea (adult) (pediatric): Secondary | ICD-10-CM | POA: Diagnosis not present

## 2013-04-02 ENCOUNTER — Encounter: Payer: Self-pay | Admitting: Internal Medicine

## 2013-04-02 ENCOUNTER — Encounter (INDEPENDENT_AMBULATORY_CARE_PROVIDER_SITE_OTHER): Payer: Self-pay

## 2013-04-02 ENCOUNTER — Ambulatory Visit (INDEPENDENT_AMBULATORY_CARE_PROVIDER_SITE_OTHER): Payer: Medicare Other | Admitting: Internal Medicine

## 2013-04-02 VITALS — BP 128/82 | HR 107 | Temp 98.5°F | Ht 65.5 in | Wt 368.4 lb

## 2013-04-02 DIAGNOSIS — R6889 Other general symptoms and signs: Secondary | ICD-10-CM | POA: Diagnosis not present

## 2013-04-02 DIAGNOSIS — Z6830 Body mass index (BMI) 30.0-30.9, adult: Secondary | ICD-10-CM | POA: Diagnosis not present

## 2013-04-02 DIAGNOSIS — J209 Acute bronchitis, unspecified: Secondary | ICD-10-CM

## 2013-04-02 DIAGNOSIS — E039 Hypothyroidism, unspecified: Secondary | ICD-10-CM | POA: Diagnosis not present

## 2013-04-02 DIAGNOSIS — D509 Iron deficiency anemia, unspecified: Secondary | ICD-10-CM | POA: Diagnosis not present

## 2013-04-02 LAB — POCT INFLUENZA A/B
INFLUENZA A, POC: NEGATIVE
INFLUENZA B, POC: NEGATIVE

## 2013-04-02 MED ORDER — PREDNISONE 10 MG PO TABS: ORAL_TABLET | ORAL | Status: DC

## 2013-04-02 MED ORDER — LEVOFLOXACIN 500 MG PO TABS: 500.0000 mg | ORAL_TABLET | Freq: Every day | ORAL | Status: DC

## 2013-04-02 NOTE — Assessment & Plan Note (Signed)
FLu test negative; so no need for tamiflu Likely another virus related URI and heading into asthma flare Continue your regular asthma medications Please take prednisone 40 mg x1 day, then 30 mg x1 day, then 20 mg x1 day, then 10 mg x1 day, and then 5 mg x1 day and stop  take levaquin 500mg once daily  X 5 days  Followup 3 months or sooner if needed 

## 2013-04-02 NOTE — Progress Notes (Signed)
Subjective:    Patient ID: Melissa Rowe, female    DOB: [birth year], 48 y.o.   MRN: [id number]  HPI   Dyspnea due to Obesity and Asthma -> 05/05/2008: Spirometry suggests restriction. CXR 04/19/2008  Normal. CT 02/10/2008  - Neg for PE. No infiltrates -> Methacholine Challenge Test: 06/25/2008: Positive PC20 between 1-4  - 10/11/2009: fev1 2L/70% -> start pulmicort  -11/08/2009: fev1 2.24L/78% and better - > switch to QVAR due to cost - May 2012: fev1 2.32L/85% and normal but symptomactic -> change QVAR to dulera sample,  6day pred burst - Jul 13, 2010: Fev1 2.5L/94%, FVC 3.21/98% and improved -> continue dulera -10/26/2010 FEV1 2.33 L/87%, FVC 2.87 L/88% >cont on dulera  - med calendar 10/26/10    HPI Acute visit 12/25/2012 This is a routine followup for asthma but she is acutely ill. 5 days ago her daughter or the birth to a child and she is exposed to many people. The following day she started having fever with sinus congestion, headaches, wheezing, cough, yellow sputum. Symptoms are persisted unchanged. Fever was associated with chills but finally broke 2-3 days ago. However other symptoms still persist along with dyspnea. Initially progressive but no stable but still significant enough that when she walks she is extremely dyspneic. She feels like she needs a nebulizer treatment in the office but she's not taken her for admission. >>Levaquin and Steroid taper /Depo Medrol   01/05/2013 Follow up  Pt presents for a follow up . Seen 2 weeks ago with asthmatic Bronchitis , tx/ Levaquin and steroid taper .  Reports breathing is 75% better since last ov.  still having some chest congestion, cough, wheezing, chest tightness, head congestion, PND.  finished Levaquin, has 2 days left on pred taper.  Feels she has a lot of sinus drainage.  Patient denies any hemoptysis, chest pain, orthopnea, PND, or increased leg swelling.   OV 04/02/2013  Chief Complaint  Patient presents with  . Follow-up     6 week rov - Head and throat congestion - Chills, sweats - Occas prod cough (creamy white) - SOB - Fatigue - Ches tightness - Occas wheezing   Followup asthma. She has missed a few appointments due to scheduling issues in her family. Now for the past 1 days she's having runny nose and this morning is having scratchy throat associated with possible slight fever or chills last night. There is no associated headache. She's beginning to feel that she might have chest congestion with wheezing develop any moment. She's had a flu shot but she's actually not have the flu itself this season. There no sick contacts. She does not have any wheezing on exam  flut test at bedside: negative    Review of Systems  Constitutional: Negative for fever, chills, diaphoresis, activity change, appetite change, fatigue and unexpected weight change.  HENT: Positive for congestion, postnasal drip, rhinorrhea and sneezing. Negative for dental problem, ear discharge, ear pain, facial swelling, hearing loss, mouth sores, nosebleeds, sinus pressure, sore throat, tinnitus, trouble swallowing and voice change.   Eyes: Negative for photophobia, discharge, itching and visual disturbance.  Respiratory: Positive for cough, chest tightness, shortness of breath and wheezing. Negative for apnea, choking and stridor.   Cardiovascular: Negative for chest pain, palpitations and leg swelling.  Gastrointestinal: Negative for nausea, vomiting, abdominal pain, constipation, blood in stool and abdominal distention.  Genitourinary: Negative for dysuria, urgency, frequency, hematuria, flank pain, decreased urine volume and difficulty urinating.  Musculoskeletal: Negative for arthralgias, back  pain, gait problem, joint swelling, myalgias, neck pain and neck stiffness.  Skin: Negative for color change, pallor and rash.  Neurological: Negative for dizziness, tremors, seizures, syncope, speech difficulty, weakness, light-headedness, numbness and  headaches.  Hematological: Negative for adenopathy. Does not bruise/bleed easily.  Psychiatric/Behavioral: Negative for confusion, sleep disturbance and agitation. The patient is not nervous/anxious.        Objective:   Physical Exam  Vitals reviewed. Constitutional: She is oriented to person, place, and time. She appears well-developed and well-nourished. No distress.  Body mass index is 60.35 kg/(m^2).   HENT:  Head: Normocephalic and atraumatic.  Right Ear: External ear normal.  Left Ear: External ear normal.  Mouth/Throat: Oropharynx is clear and moist. No oropharyngeal exudate.  Nasal twang Post nasal drip +  Eyes: Conjunctivae and EOM are normal. Pupils are equal, round, and reactive to light. Right eye exhibits no discharge. Left eye exhibits no discharge. No scleral icterus.  Neck: Normal range of motion. Neck supple. No JVD present. No tracheal deviation present. No thyromegaly present.  Cardiovascular: Normal rate, regular rhythm, normal heart sounds and intact distal pulses.  Exam reveals no gallop and no friction rub.   No murmur heard. Pulmonary/Chest: Effort normal and breath sounds normal. No respiratory distress. She has no wheezes. She has no rales. She exhibits no tenderness.  Abdominal: Soft. Bowel sounds are normal. She exhibits no distension and no mass. There is no tenderness. There is no rebound and no guarding.  Musculoskeletal: Normal range of motion. She exhibits no edema and no tenderness.  Lymphadenopathy:    She has no cervical adenopathy.  Neurological: She is alert and oriented to person, place, and time. She has normal reflexes. No cranial nerve deficit. She exhibits normal muscle tone. Coordination normal.  Skin: Skin is warm and dry. No rash noted. She is not diaphoretic. No erythema. No pallor.  Psychiatric: She has a normal mood and affect. Her behavior is normal. Judgment and thought content normal.          Assessment & Plan:

## 2013-04-02 NOTE — Patient Instructions (Signed)
FLu test negative; so no need for tamiflu Likely another virus related URI and heading into asthma flare Continue your regular asthma medications Please take prednisone 40 mg x1 day, then 30 mg x1 day, then 20 mg x1 day, then 10 mg x1 day, and then 5 mg x1 day and stop  take levaquin 500mg once daily  X 5 days  Followup 3 months or sooner if needed 

## 2013-04-02 NOTE — Assessment & Plan Note (Signed)
FLu test negative; so no need for tamiflu Likely another virus related URI and heading into asthma flare Continue your regular asthma medications Please take prednisone 40 mg x1 day, then 30 mg x1 day, then 20 mg x1 day, then 10 mg x1 day, and then 5 mg x1 day and stop  take levaquin 500mg  once daily  X 5 days  Followup 3 months or sooner if needed

## 2013-06-02 ENCOUNTER — Emergency Department (HOSPITAL_COMMUNITY)
Admission: EM | Admit: 2013-06-02 | Discharge: 2013-06-02 | Disposition: A | Discharge status: Home / Self Care | Payer: Medicare Other | Attending: Emergency Medicine | Admitting: Emergency Medicine

## 2013-06-02 ENCOUNTER — Encounter (HOSPITAL_COMMUNITY): Payer: Self-pay | Admitting: Emergency Medicine

## 2013-06-02 DIAGNOSIS — IMO0002 Reserved for concepts with insufficient information to code with codable children: Secondary | ICD-10-CM | POA: Diagnosis not present

## 2013-06-02 DIAGNOSIS — Z87891 Personal history of nicotine dependence: Secondary | ICD-10-CM | POA: Diagnosis not present

## 2013-06-02 DIAGNOSIS — Z862 Personal history of diseases of the blood and blood-forming organs and certain disorders involving the immune mechanism: Secondary | ICD-10-CM | POA: Insufficient documentation

## 2013-06-02 DIAGNOSIS — G4733 Obstructive sleep apnea (adult) (pediatric): Secondary | ICD-10-CM | POA: Diagnosis not present

## 2013-06-02 DIAGNOSIS — G8929 Other chronic pain: Secondary | ICD-10-CM | POA: Insufficient documentation

## 2013-06-02 DIAGNOSIS — M129 Arthropathy, unspecified: Secondary | ICD-10-CM | POA: Insufficient documentation

## 2013-06-02 DIAGNOSIS — Z9981 Dependence on supplemental oxygen: Secondary | ICD-10-CM | POA: Insufficient documentation

## 2013-06-02 DIAGNOSIS — Z79899 Other long term (current) drug therapy: Secondary | ICD-10-CM | POA: Insufficient documentation

## 2013-06-02 DIAGNOSIS — Z792 Long term (current) use of antibiotics: Secondary | ICD-10-CM | POA: Insufficient documentation

## 2013-06-02 DIAGNOSIS — J45909 Unspecified asthma, uncomplicated: Secondary | ICD-10-CM | POA: Diagnosis not present

## 2013-06-02 DIAGNOSIS — K219 Gastro-esophageal reflux disease without esophagitis: Secondary | ICD-10-CM | POA: Insufficient documentation

## 2013-06-02 DIAGNOSIS — M545 Low back pain, unspecified: Secondary | ICD-10-CM | POA: Diagnosis not present

## 2013-06-02 DIAGNOSIS — R52 Pain, unspecified: Secondary | ICD-10-CM | POA: Diagnosis not present

## 2013-06-02 DIAGNOSIS — E039 Hypothyroidism, unspecified: Secondary | ICD-10-CM | POA: Insufficient documentation

## 2013-06-02 DIAGNOSIS — R609 Edema, unspecified: Secondary | ICD-10-CM | POA: Insufficient documentation

## 2013-06-02 MED ORDER — DIAZEPAM 5 MG PO TABS
10.0000 mg | ORAL_TABLET | Freq: Once | ORAL | Status: AC
  Administered 2013-06-02: 10 mg via ORAL
  Filled 2013-06-02: qty 2

## 2013-06-02 MED ORDER — HYDROMORPHONE HCL PF 1 MG/ML IJ SOLN
1.0000 mg | Freq: Once | INTRAMUSCULAR | Status: AC
  Administered 2013-06-02: 1 mg via INTRAMUSCULAR
  Filled 2013-06-02: qty 1

## 2013-06-02 MED ORDER — DIAZEPAM 10 MG PO TABS: 10.0000 mg | ORAL_TABLET | Freq: Three times a day (TID) | ORAL | Status: DC | PRN

## 2013-06-02 MED ORDER — HYDROCODONE-ACETAMINOPHEN 5-325 MG PO TABS: 1.0000 | ORAL_TABLET | Freq: Four times a day (QID) | ORAL | Status: DC | PRN

## 2013-06-02 MED ORDER — KETOROLAC TROMETHAMINE 30 MG/ML IJ SOLN
30.0000 mg | Freq: Once | INTRAMUSCULAR | Status: AC
  Administered 2013-06-02: 30 mg via INTRAMUSCULAR
  Filled 2013-06-02: qty 1

## 2013-06-02 NOTE — ED Provider Notes (Signed)
Medical screening examination/treatment/procedure(s) were performed by non-physician practitioner and as supervising physician I was immediately available for consultation/collaboration.   EKG Interpretation None        Hoy Morn, MD 06/02/13 2258

## 2013-06-02 NOTE — ED Notes (Signed)
Pt has a ride home.  

## 2013-06-02 NOTE — ED Provider Notes (Signed)
CSN: [id number]     Arrival date & time 06/02/13  1951 History  This chart was scribed for non-physician practitioner working with Hoy Morn, MD by Mercy Moore, ED Scribe. This patient was seen in room WTR8/WTR8 and the patient's care was started at 9:00 PM.   Chief Complaint  Patient presents with  . Back Pain      The history is provided by the patient. No language interpreter was used.   HPI Comments: Melissa Rowe is a 48 y.o. female with history of arthritis and chronic back pain for several years who presents to the Emergency Department complaining of progressively worsening back and knee pain. Patient reports that this morning she was unable move upon wakening. After laying for 30 minutes patient was able to get herself out of bed and make it to the bathroom where she took her medicine. However, the medicine provided no relief. After laying back down, the pain had worsened. Patient reports getting up to walk around and says she felt shooting, burning, tingling pain down the right side of her leg. She says it doesn't feel muscular but feels like it is in the bone.  Generally, the patient reports that her long time prescriptions are no longer working. Her frequency and dosage of medication have been increased, yet her medications are no longer relieving her pain. Patient reports that she has been going to the gym, taking care of her mother, walking her dog, and generally participating in new activities which could suspectedly be the partial cause of her increased pain coupled with the tolerance that she has developed for the medication.  Patient is treated for her back pain by Dr. [name] and Dr. Rush Farmer.   Past Medical History  Diagnosis Date  . Dyspnea   . Gastroenteritis   . Hyperlipidemia   . Diverticulitis   . Atrial mass   . Hypothyroidism   . Asthma   . Urgency of urination   . Frequency of urination   . GERD (gastroesophageal reflux disease)   . Arthritis   .  Environmental allergies   . Obesity   . OSA (obstructive sleep apnea)     wears CPAP night pt does not know settings  . Hemorrhoids     external with some bleeding  . Anemia sept /oct 2014   Past Surgical History  Procedure Laterality Date  . Cholecystectomy  2008  . Polyps removal  2007  . Tubal ligation  1992  . Carpal tunnel release      right  . Colonoscopy    . Knee arthroscopy  03/18/2012    Procedure: ARTHROSCOPY KNEE;  Surgeon: Mcarthur Rossetti, MD;  Location: [facility];  Service: Orthopedics;  Laterality: Right;  Right knee arthroscopy with debridement and three compartment chondroplasty  . Colonoscopy with propofol N/A 02/24/2013    Procedure: COLONOSCOPY WITH PROPOFOL;  Surgeon: Ladene Artist, MD;  Location: [facility];  Service: Endoscopy;  Laterality: N/A;   Family History  Problem Relation Age of Onset  . Heart murmur Mother     rheumatoid arthritis  . Asthma Brother     and mother both as a child   History  Substance Use Topics  . Smoking status: Former Smoker -- 2.00 packs/day for 7 years    Types: Cigarettes    Quit date: 02/12/1990  . Smokeless tobacco: Never Used  . Alcohol Use: Yes     Comment: rare   OB History   Grav Para Term Preterm  Abortions TAB SAB Ect Mult Living                 Review of Systems  Constitutional: Negative for fever and chills.  Respiratory: Negative for shortness of breath.   Cardiovascular: Negative for chest pain and leg swelling.  Genitourinary: Negative for dysuria and flank pain.  Musculoskeletal: Positive for back pain and gait problem. Negative for neck pain.  Skin: Negative for wound.  Neurological: Negative for weakness and numbness.      Allergies  Review of patient's allergies indicates no known allergies.  Home Medications   Prior to Admission medications   Medication Sig Start Date End Date Taking? Authorizing Provider  albuterol (PROVENTIL HFA;VENTOLIN HFA) 108 (90 BASE) MCG/ACT inhaler Inhale 2  puffs into the lungs every 6 (six) hours as needed for wheezing or shortness of breath. For shortness of breath.    Historical Provider, MD  calcium carbonate (OS-CAL) 600 MG TABS Take 600 mg by mouth daily.     Historical Provider, MD  cyclobenzaprine (FLEXERIL) 10 MG tablet Take 10 mg by mouth 2 (two) times daily as needed for muscle spasms. For muscle spasms    Historical Provider, MD  levofloxacin (LEVAQUIN) 500 MG tablet Take 1 tablet (500 mg total) by mouth daily. 04/02/13   Brand Males, MD  levothyroxine (SYNTHROID, LEVOTHROID) 50 MCG tablet Take 50 mcg by mouth daily.    Historical Provider, MD  mometasone-formoterol (DULERA) 100-5 MCG/ACT AERO Inhale 2 puffs into the lungs 2 (two) times daily.    Historical Provider, MD  montelukast (SINGULAIR) 10 MG tablet Take 10 mg by mouth daily with breakfast. 05/20/12   Brand Males, MD  Multiple Vitamin (MULTIVITAMIN) capsule Take 1 capsule by mouth daily.     Historical Provider, MD  Omeprazole-Sodium Bicarbonate (ZEGERID OTC PO) Take 1 tablet by mouth daily.    Historical Provider, MD  Phentermine-Topiramate (QSYMIA) 3.75-23 MG CP24 Take 1 tablet by mouth daily.    Historical Provider, MD  polyethylene glycol (MIRALAX / GLYCOLAX) packet Take 17 g by mouth 3 (three) times daily.     Historical Provider, MD  predniSONE (DELTASONE) 10 MG tablet Take 4 tabs daily x 1 days, 3 tabs daily x 1 days, 2 tabs daily x 1 days, 1 tab daily x 1 days, then 1/2 tablet daily x 1 day, then stop 04/02/13   Brand Males, MD  spironolactone (ALDACTONE) 25 MG tablet Take 1 tablet by mouth daily. 05/20/12   Historical Provider, MD  tolterodine (DETROL LA) 2 MG 24 hr capsule Take 2 mg by mouth daily.    Historical Provider, MD  traMADol (ULTRAM) 50 MG tablet Take 1 tablet by mouth every 6 (six) hours as needed for moderate pain or severe pain.  05/15/12   Historical Provider, MD   Triage Vitals: BP 142/71  Pulse 97  Temp(Src) 98 F (36.7 C) (Oral)  Resp 20  SpO2  98% Physical Exam  Nursing note and vitals reviewed. Constitutional: She is oriented to person, place, and time. She appears well-developed and well-nourished. No distress.  Morbidly obese  HENT:  Head: Normocephalic and atraumatic.  Eyes: EOM are normal. Pupils are equal, round, and reactive to light.  Neck: Neck supple. No tracheal deviation present.  Cardiovascular: Normal rate.   Pulmonary/Chest: Effort normal. No respiratory distress.  Musculoskeletal: Normal range of motion. She exhibits edema.       Back:  Neurological: She is alert and oriented to person, place, and time.  Skin: Skin  is warm and dry.  Psychiatric: She has a normal mood and affect. Her behavior is normal.    ED Course  Procedures (including critical care time) DIAGNOSTIC STUDIES: Oxygen Saturation is 98% on room air, normal by my interpretation.    COORDINATION OF CARE: 9:25 PM- Discussed treatment plan with patient at bedside and patient agreed to plan.     Labs Review Labs Reviewed - No data to display  Imaging Review No results found.   EKG Interpretation None      MDM  Patient will be given IM, Dilaudid, and Toradol, and by mouth Valium in the emergency room in an attempt to get the patient.  Comfortable.  She will be scheduled for an MRI of her LS-spine, tomorrow, she is to followup with Dr. [name] and Dr. Ninfa Linden by phone Patient received considerable relief of her pain.  She will be discharged home with Vicodin, and Valium, and has been scheduled for an outpatient MRI Final diagnoses:  Acute exacerbation of chronic low back pain       I personally performed the services described in this documentation, which was scribed in my presence. The recorded information has been reviewed and is accurate.   Garald Balding, NP 06/02/13 2233

## 2013-06-02 NOTE — ED Notes (Signed)
Pt states that she has chronic back for years; pt states that over the last few weeks she has seen the orthopedist and her primary care physician; pt states that she is currently on Flexeril, Naproxen, and Tramadol for her pain but the medications are no longer helping; pt states that her doctor told her that her weight was not helping and he was unable to get clear X-ray's but states that pt may have pinched nerve. Pt states that she just needs something else for the pain.

## 2013-06-02 NOTE — Discharge Instructions (Signed)
Her medications have been changed.  Please discontinue taking the tramadol and Flexeril.  Start taking the Vicodin, and Valium been scheduled for an outpatient MRI of your lower spine.  Appointment for followup with Dr. Ninfa Linden Dr. [name] and Dr. Chase Caller

## 2013-06-03 ENCOUNTER — Other Ambulatory Visit: Payer: Self-pay | Admitting: Internal Medicine

## 2013-06-03 DIAGNOSIS — IMO0002 Reserved for concepts with insufficient information to code with codable children: Secondary | ICD-10-CM

## 2013-06-04 ENCOUNTER — Ambulatory Visit
Admission: RE | Admit: 2013-06-04 | Discharge: 2013-06-04 | Disposition: A | Discharge status: Home / Self Care | Payer: Medicare Other | Source: Ambulatory Visit | Attending: Internal Medicine | Admitting: Internal Medicine

## 2013-06-04 DIAGNOSIS — M48061 Spinal stenosis, lumbar region without neurogenic claudication: Secondary | ICD-10-CM | POA: Diagnosis not present

## 2013-06-04 DIAGNOSIS — IMO0002 Reserved for concepts with insufficient information to code with codable children: Secondary | ICD-10-CM

## 2013-06-04 DIAGNOSIS — M5126 Other intervertebral disc displacement, lumbar region: Secondary | ICD-10-CM | POA: Diagnosis not present

## 2013-06-04 DIAGNOSIS — M5137 Other intervertebral disc degeneration, lumbosacral region: Secondary | ICD-10-CM | POA: Diagnosis not present

## 2013-06-08 DIAGNOSIS — Z6841 Body Mass Index (BMI) 40.0 and over, adult: Secondary | ICD-10-CM | POA: Diagnosis not present

## 2013-06-08 DIAGNOSIS — M1710 Unilateral primary osteoarthritis, unspecified knee: Secondary | ICD-10-CM | POA: Diagnosis not present

## 2013-06-08 DIAGNOSIS — M545 Low back pain, unspecified: Secondary | ICD-10-CM | POA: Diagnosis not present

## 2013-06-09 ENCOUNTER — Ambulatory Visit (HOSPITAL_COMMUNITY): Payer: Medicare Other

## 2013-06-11 DIAGNOSIS — M2550 Pain in unspecified joint: Secondary | ICD-10-CM | POA: Diagnosis not present

## 2013-06-11 DIAGNOSIS — M6281 Muscle weakness (generalized): Secondary | ICD-10-CM | POA: Diagnosis not present

## 2013-06-11 DIAGNOSIS — R269 Unspecified abnormalities of gait and mobility: Secondary | ICD-10-CM | POA: Diagnosis not present

## 2013-06-11 DIAGNOSIS — M545 Low back pain, unspecified: Secondary | ICD-10-CM | POA: Diagnosis not present

## 2013-06-16 DIAGNOSIS — M545 Low back pain, unspecified: Secondary | ICD-10-CM | POA: Diagnosis not present

## 2013-06-16 DIAGNOSIS — M6281 Muscle weakness (generalized): Secondary | ICD-10-CM | POA: Diagnosis not present

## 2013-06-16 DIAGNOSIS — M2550 Pain in unspecified joint: Secondary | ICD-10-CM | POA: Diagnosis not present

## 2013-06-16 DIAGNOSIS — R269 Unspecified abnormalities of gait and mobility: Secondary | ICD-10-CM | POA: Diagnosis not present

## 2013-06-18 DIAGNOSIS — R269 Unspecified abnormalities of gait and mobility: Secondary | ICD-10-CM | POA: Diagnosis not present

## 2013-06-18 DIAGNOSIS — M6281 Muscle weakness (generalized): Secondary | ICD-10-CM | POA: Diagnosis not present

## 2013-06-18 DIAGNOSIS — M545 Low back pain, unspecified: Secondary | ICD-10-CM | POA: Diagnosis not present

## 2013-06-18 DIAGNOSIS — M2550 Pain in unspecified joint: Secondary | ICD-10-CM | POA: Diagnosis not present

## 2013-06-19 DIAGNOSIS — M545 Low back pain, unspecified: Secondary | ICD-10-CM | POA: Diagnosis not present

## 2013-06-19 DIAGNOSIS — M2550 Pain in unspecified joint: Secondary | ICD-10-CM | POA: Diagnosis not present

## 2013-06-19 DIAGNOSIS — M6281 Muscle weakness (generalized): Secondary | ICD-10-CM | POA: Diagnosis not present

## 2013-06-19 DIAGNOSIS — R269 Unspecified abnormalities of gait and mobility: Secondary | ICD-10-CM | POA: Diagnosis not present

## 2013-06-23 DIAGNOSIS — M2550 Pain in unspecified joint: Secondary | ICD-10-CM | POA: Diagnosis not present

## 2013-06-23 DIAGNOSIS — R269 Unspecified abnormalities of gait and mobility: Secondary | ICD-10-CM | POA: Diagnosis not present

## 2013-06-23 DIAGNOSIS — M6281 Muscle weakness (generalized): Secondary | ICD-10-CM | POA: Diagnosis not present

## 2013-06-23 DIAGNOSIS — M545 Low back pain, unspecified: Secondary | ICD-10-CM | POA: Diagnosis not present

## 2013-07-07 DIAGNOSIS — G4733 Obstructive sleep apnea (adult) (pediatric): Secondary | ICD-10-CM | POA: Diagnosis not present

## 2013-07-14 DIAGNOSIS — M545 Low back pain, unspecified: Secondary | ICD-10-CM | POA: Diagnosis not present

## 2013-07-14 DIAGNOSIS — M6281 Muscle weakness (generalized): Secondary | ICD-10-CM | POA: Diagnosis not present

## 2013-07-14 DIAGNOSIS — R269 Unspecified abnormalities of gait and mobility: Secondary | ICD-10-CM | POA: Diagnosis not present

## 2013-07-14 DIAGNOSIS — M2550 Pain in unspecified joint: Secondary | ICD-10-CM | POA: Diagnosis not present

## 2013-07-16 DIAGNOSIS — M545 Low back pain, unspecified: Secondary | ICD-10-CM | POA: Diagnosis not present

## 2013-07-16 DIAGNOSIS — R269 Unspecified abnormalities of gait and mobility: Secondary | ICD-10-CM | POA: Diagnosis not present

## 2013-07-16 DIAGNOSIS — M2550 Pain in unspecified joint: Secondary | ICD-10-CM | POA: Diagnosis not present

## 2013-07-16 DIAGNOSIS — M6281 Muscle weakness (generalized): Secondary | ICD-10-CM | POA: Diagnosis not present

## 2013-09-03 ENCOUNTER — Other Ambulatory Visit: Payer: Self-pay | Admitting: Internal Medicine

## 2013-09-16 DIAGNOSIS — M1710 Unilateral primary osteoarthritis, unspecified knee: Secondary | ICD-10-CM | POA: Diagnosis not present

## 2013-09-21 ENCOUNTER — Emergency Department (HOSPITAL_COMMUNITY): Payer: Medicare Other

## 2013-09-21 ENCOUNTER — Emergency Department (HOSPITAL_COMMUNITY)
Admission: EM | Admit: 2013-09-21 | Discharge: 2013-09-22 | Disposition: A | Discharge status: Home / Self Care | Payer: Medicare Other | Attending: Emergency Medicine | Admitting: Emergency Medicine

## 2013-09-21 ENCOUNTER — Encounter (HOSPITAL_COMMUNITY): Payer: Self-pay | Admitting: Emergency Medicine

## 2013-09-21 DIAGNOSIS — S0990XA Unspecified injury of head, initial encounter: Secondary | ICD-10-CM | POA: Diagnosis not present

## 2013-09-21 DIAGNOSIS — Z79899 Other long term (current) drug therapy: Secondary | ICD-10-CM | POA: Diagnosis not present

## 2013-09-21 DIAGNOSIS — E669 Obesity, unspecified: Secondary | ICD-10-CM | POA: Insufficient documentation

## 2013-09-21 DIAGNOSIS — E039 Hypothyroidism, unspecified: Secondary | ICD-10-CM | POA: Insufficient documentation

## 2013-09-21 DIAGNOSIS — Z87448 Personal history of other diseases of urinary system: Secondary | ICD-10-CM | POA: Diagnosis not present

## 2013-09-21 DIAGNOSIS — Z23 Encounter for immunization: Secondary | ICD-10-CM | POA: Diagnosis not present

## 2013-09-21 DIAGNOSIS — S4980XA Other specified injuries of shoulder and upper arm, unspecified arm, initial encounter: Secondary | ICD-10-CM | POA: Diagnosis not present

## 2013-09-21 DIAGNOSIS — IMO0001 Reserved for inherently not codable concepts without codable children: Secondary | ICD-10-CM | POA: Diagnosis not present

## 2013-09-21 DIAGNOSIS — M7918 Myalgia, other site: Secondary | ICD-10-CM

## 2013-09-21 DIAGNOSIS — M546 Pain in thoracic spine: Secondary | ICD-10-CM | POA: Diagnosis not present

## 2013-09-21 DIAGNOSIS — K219 Gastro-esophageal reflux disease without esophagitis: Secondary | ICD-10-CM | POA: Diagnosis not present

## 2013-09-21 DIAGNOSIS — Y9301 Activity, walking, marching and hiking: Secondary | ICD-10-CM | POA: Insufficient documentation

## 2013-09-21 DIAGNOSIS — S0993XA Unspecified injury of face, initial encounter: Secondary | ICD-10-CM | POA: Insufficient documentation

## 2013-09-21 DIAGNOSIS — Z8739 Personal history of other diseases of the musculoskeletal system and connective tissue: Secondary | ICD-10-CM | POA: Insufficient documentation

## 2013-09-21 DIAGNOSIS — IMO0002 Reserved for concepts with insufficient information to code with codable children: Secondary | ICD-10-CM | POA: Diagnosis not present

## 2013-09-21 DIAGNOSIS — S199XXA Unspecified injury of neck, initial encounter: Secondary | ICD-10-CM | POA: Diagnosis not present

## 2013-09-21 DIAGNOSIS — Y92009 Unspecified place in unspecified non-institutional (private) residence as the place of occurrence of the external cause: Secondary | ICD-10-CM | POA: Diagnosis not present

## 2013-09-21 DIAGNOSIS — Z862 Personal history of diseases of the blood and blood-forming organs and certain disorders involving the immune mechanism: Secondary | ICD-10-CM | POA: Insufficient documentation

## 2013-09-21 DIAGNOSIS — Z87891 Personal history of nicotine dependence: Secondary | ICD-10-CM | POA: Diagnosis not present

## 2013-09-21 DIAGNOSIS — J45909 Unspecified asthma, uncomplicated: Secondary | ICD-10-CM | POA: Insufficient documentation

## 2013-09-21 DIAGNOSIS — M25519 Pain in unspecified shoulder: Secondary | ICD-10-CM | POA: Diagnosis not present

## 2013-09-21 DIAGNOSIS — R296 Repeated falls: Secondary | ICD-10-CM | POA: Diagnosis not present

## 2013-09-21 DIAGNOSIS — S46909A Unspecified injury of unspecified muscle, fascia and tendon at shoulder and upper arm level, unspecified arm, initial encounter: Secondary | ICD-10-CM | POA: Insufficient documentation

## 2013-09-21 DIAGNOSIS — Z9981 Dependence on supplemental oxygen: Secondary | ICD-10-CM | POA: Insufficient documentation

## 2013-09-21 DIAGNOSIS — S01501A Unspecified open wound of lip, initial encounter: Secondary | ICD-10-CM | POA: Insufficient documentation

## 2013-09-21 DIAGNOSIS — R55 Syncope and collapse: Secondary | ICD-10-CM | POA: Insufficient documentation

## 2013-09-21 DIAGNOSIS — W19XXXA Unspecified fall, initial encounter: Secondary | ICD-10-CM

## 2013-09-21 DIAGNOSIS — M542 Cervicalgia: Secondary | ICD-10-CM | POA: Diagnosis not present

## 2013-09-21 MED ORDER — OXYCODONE-ACETAMINOPHEN 5-325 MG PO TABS
1.0000 | ORAL_TABLET | Freq: Once | ORAL | Status: AC
  Administered 2013-09-21: 1 via ORAL
  Filled 2013-09-21: qty 1

## 2013-09-21 MED ORDER — ONDANSETRON 4 MG PO TBDP
4.0000 mg | ORAL_TABLET | Freq: Once | ORAL | Status: AC
  Administered 2013-09-21: 4 mg via ORAL
  Filled 2013-09-21: qty 1

## 2013-09-21 NOTE — ED Notes (Signed)
Presents post fall from yesterday evening- pt  Knee gave out and she fell face first into a wall-right sided facial swelling, small laceration to inside of mouth, endorses headache, dizziness post fall and left shoulder  And left sided neck pain. Alert and oriented, C-collar applied.

## 2013-09-21 NOTE — ED Notes (Addendum)
BETH, NP AT [place].

## 2013-09-22 MED ORDER — CYCLOBENZAPRINE HCL 10 MG PO TABS: 10.0000 mg | ORAL_TABLET | Freq: Two times a day (BID) | ORAL | Status: AC | PRN

## 2013-09-22 MED ORDER — TETANUS-DIPHTH-ACELL PERTUSSIS 5-2.5-18.5 LF-MCG/0.5 IM SUSP
0.5000 mL | Freq: Once | INTRAMUSCULAR | Status: AC
  Administered 2013-09-22: 0.5 mL via INTRAMUSCULAR
  Filled 2013-09-22: qty 0.5

## 2013-09-22 MED ORDER — CYCLOBENZAPRINE HCL 10 MG PO TABS: 10.0000 mg | ORAL_TABLET | Freq: Two times a day (BID) | ORAL | Status: DC | PRN

## 2013-09-22 MED ORDER — IBUPROFEN 800 MG PO TABS: 800.0000 mg | ORAL_TABLET | Freq: Three times a day (TID) | ORAL | Status: DC

## 2013-09-22 NOTE — ED Notes (Signed)
Pt A&OX4, wheeled out of ED via wheelchair, NAD. 

## 2013-09-22 NOTE — ED Provider Notes (Signed)
Medical screening examination/treatment/procedure(s) were performed by non-physician practitioner and as supervising physician I was immediately available for consultation/collaboration.   EKG Interpretation None       Threasa Beards, MD 09/22/13 [phone]

## 2013-09-22 NOTE — Discharge Instructions (Signed)
You have musculoskeletal pain, from your fall.  Your x-rays are all normal and reassuring.  You may feel pain from muscle spasms as you heal.  Be sure to follow-up with your primary care provider to ensure your pain is improving.  You have been given medicines for pain/inflammation and muscle spams.     SEEK IMMEDIATE MEDICAL ATTENTION IF: You develop difficulties swallowing or breathing.  You have new or worse numbness, weakness, tingling, or movement problems in your arms or legs.  You develop increasing pain which is uncontrolled with medications.

## 2013-09-22 NOTE — ED Provider Notes (Signed)
CSN: [id number]     Arrival date & time 09/21/13  1851 History   First MD Initiated Contact with Patient 09/21/13 2128     Chief Complaint  Patient presents with  . Fall   (Consider location/radiation/quality/duration/timing/severity/associated sxs/prior Treatment) HPI Pt is 48 yo female with report of fall from standing position yesterday. She reports walking from the bathroom and attempting to turn down the hall and her knee "gave out", causing her to fall into the wall. She struck the right side of her face, her right hand and her left shoulder. She reports she had some bleeding from the inside of her lip, that resolved.  She attempted aleve at home for headache, and left shoulder pain, with some relief. She reports headache and left shoulder and left neck pain since the fall. She denies dizziness or light-headedness prior to fall. She denies, nasuea, vomiting, numbness or syncopal episode since the fall.    Past Medical History  Diagnosis Date  . Dyspnea   . Gastroenteritis   . Hyperlipidemia   . Diverticulitis   . Atrial mass   . Hypothyroidism   . Asthma   . Urgency of urination   . Frequency of urination   . GERD (gastroesophageal reflux disease)   . Arthritis   . Environmental allergies   . Obesity   . OSA (obstructive sleep apnea)     wears CPAP night pt does not know settings  . Hemorrhoids     external with some bleeding  . Anemia sept /oct 2014   Past Surgical History  Procedure Laterality Date  . Cholecystectomy  2008  . Polyps removal  2007  . Tubal ligation  1992  . Carpal tunnel release      right  . Colonoscopy    . Knee arthroscopy  03/18/2012    Procedure: ARTHROSCOPY KNEE;  Surgeon: Mcarthur Rossetti, MD;  Location: [facility];  Service: Orthopedics;  Laterality: Right;  Right knee arthroscopy with debridement and three compartment chondroplasty  . Colonoscopy with propofol N/A 02/24/2013    Procedure: COLONOSCOPY WITH PROPOFOL;  Surgeon: Ladene Artist,  MD;  Location: [facility];  Service: Endoscopy;  Laterality: N/A;   Family History  Problem Relation Age of Onset  . Heart murmur Mother     rheumatoid arthritis  . Asthma Brother     and mother both as a child   History  Substance Use Topics  . Smoking status: Former Smoker -- 2.00 packs/day for 7 years    Types: Cigarettes    Quit date: 02/12/1990  . Smokeless tobacco: Never Used  . Alcohol Use: Yes     Comment: rare   OB History   Grav Para Term Preterm Abortions TAB SAB Ect Mult Living                 Review of Systems  Constitutional: Negative for fever and chills.  HENT: Positive for facial swelling. Negative for sore throat.   Eyes: Negative for visual disturbance.  Respiratory: Negative for cough, chest tightness and shortness of breath.   Cardiovascular: Negative for chest pain and leg swelling.  Gastrointestinal: Negative for nausea.  Genitourinary: Negative for dysuria.  Musculoskeletal: Positive for back pain, myalgias and neck pain.  Skin: Negative for rash.  Neurological: Negative for dizziness, syncope, speech difficulty, weakness, light-headedness, numbness and headaches.      Allergies  Review of patient's allergies indicates no known allergies.  Home Medications   Prior to Admission medications  Medication Sig Start Date End Date Taking? Authorizing Provider  levothyroxine (SYNTHROID, LEVOTHROID) 75 MCG tablet Take 75 mcg by mouth daily before breakfast.   Yes Historical Provider, MD  Omeprazole-Sodium Bicarbonate (ZEGERID OTC PO) Take 1 tablet by mouth daily.   Yes Historical Provider, MD  TRAMADOL HCL PO Take 1 tablet by mouth.   Yes Historical Provider, MD  albuterol (PROVENTIL HFA;VENTOLIN HFA) 108 (90 BASE) MCG/ACT inhaler Inhale 2 puffs into the lungs every 6 (six) hours as needed for wheezing or shortness of breath. For shortness of breath.    Historical Provider, MD  calcium carbonate (OS-CAL) 600 MG TABS Take 600 mg by mouth daily.      Historical Provider, MD  cyclobenzaprine (FLEXERIL) 10 MG tablet Take 1 tablet (10 mg total) by mouth 2 (two) times daily as needed for muscle spasms. 09/22/13   [name], NP  diazepam (VALIUM) 10 MG tablet Take 1 tablet (10 mg total) by mouth every 8 (eight) hours as needed for anxiety. 06/02/13   Garald Balding, NP  HYDROcodone-acetaminophen (NORCO/VICODIN) 5-325 MG per tablet Take 1-2 tablets by mouth every 6 (six) hours as needed. 06/02/13   Garald Balding, NP  ibuprofen (ADVIL,MOTRIN) 800 MG tablet Take 1 tablet (800 mg total) by mouth 3 (three) times daily. 09/22/13   [name], NP  levofloxacin (LEVAQUIN) 500 MG tablet Take 1 tablet (500 mg total) by mouth daily. 04/02/13   Brand Males, MD  mometasone-formoterol (DULERA) 100-5 MCG/ACT AERO Inhale 2 puffs into the lungs 2 (two) times daily.    Historical Provider, MD  montelukast (SINGULAIR) 10 MG tablet Take 10 mg by mouth daily with breakfast. 05/20/12   Brand Males, MD  montelukast (SINGULAIR) 10 MG tablet TAKE ONE TABLET BY MOUTH ONCE DAILY 09/03/13   Brand Males, MD  Multiple Vitamin (MULTIVITAMIN) capsule Take 1 capsule by mouth daily.     Historical Provider, MD  Phentermine-Topiramate (QSYMIA) 3.75-23 MG CP24 Take 1 tablet by mouth daily.    Historical Provider, MD  polyethylene glycol (MIRALAX / GLYCOLAX) packet Take 17 g by mouth 3 (three) times daily.     Historical Provider, MD  predniSONE (DELTASONE) 10 MG tablet Take 4 tabs daily x 1 days, 3 tabs daily x 1 days, 2 tabs daily x 1 days, 1 tab daily x 1 days, then 1/2 tablet daily x 1 day, then stop 04/02/13   Brand Males, MD  spironolactone (ALDACTONE) 25 MG tablet Take 1 tablet by mouth daily. 05/20/12   Historical Provider, MD  tolterodine (DETROL LA) 2 MG 24 hr capsule Take 2 mg by mouth daily.    Historical Provider, MD   BP 117/70  Pulse 75  Temp(Src) 98.4 F (36.9 C) (Oral)  Resp 16  Ht 5\' 6"  (1.676 m)  Wt 360 lb (163.295 kg)  BMI 58.13 kg/m2   SpO2 100%  LMP 08/28/2013 Physical Exam  Nursing note and vitals reviewed. Constitutional: She is oriented to person, place, and time. She appears well-developed and well-nourished. No distress.  HENT:  Head: Normocephalic.    Mouth/Throat: Oropharynx is clear and moist. No oropharyngeal exudate.  Pt has small lac to upper frenulum.  No bleeding noted.   Eyes: Conjunctivae are normal.  Neck: Neck supple. Muscular tenderness present. No thyromegaly present.    Cardiovascular: Normal rate, regular rhythm and intact distal pulses.   Pulmonary/Chest: Effort normal and breath sounds normal. No respiratory distress. She has no wheezes. She has no rales. She exhibits no tenderness.  Abdominal: Soft. There is no tenderness.  Musculoskeletal: She exhibits tenderness.       Right shoulder: She exhibits tenderness.       Thoracic back: She exhibits tenderness. She exhibits no swelling and no deformity.       Back:  Lymphadenopathy:    She has no cervical adenopathy.  Neurological: She is alert and oriented to person, place, and time. She has normal strength. No cranial nerve deficit or sensory deficit. Coordination normal.  Skin: Skin is warm and dry. No rash noted. She is not diaphoretic.  Psychiatric: She has a normal mood and affect.    ED Course  Procedures (including critical care time) Labs Review Labs Reviewed - No data to display  Imaging Review Dg Thoracic Spine 2 View  09/21/2013   CLINICAL DATA:  Status post fall; upper back pain and left shoulder pain.  EXAM: THORACIC SPINE - 2 VIEW  COMPARISON:  Chest radiograph performed 03/13/2012, and CTA of the chest performed 02/10/2008  FINDINGS: There is no evidence of fracture or subluxation. Vertebral bodies demonstrate normal height and alignment. Intervertebral disc spaces are preserved. Mild anterior osteophytes are seen along the mid cervical spine.  The visualized portions of both lungs are clear. The mediastinum is unremarkable  in appearance.  IMPRESSION: No evidence of fracture or subluxation along the thoracic spine.   Electronically Signed   By: Garald Balding M.D.   On: 09/21/2013 23:04   Dg Clavicle Left  09/21/2013   CLINICAL DATA:  History of trauma from a fall complaining of left shoulder pain.  EXAM: LEFT CLAVICLE - 2+ VIEWS  COMPARISON:  No priors.  FINDINGS: Two views of the left clavicle demonstrate no acute displaced fracture. Visualized portions of the left hemithorax are unremarkable.  IMPRESSION: 1. Negative for acute fracture of the left clavicle.   Electronically Signed   By: Vinnie Langton M.D.   On: 09/21/2013 23:06   Ct Head Wo Contrast  09/21/2013   CLINICAL DATA:  Syncope.  Trauma to the head and neck.  EXAM: CT HEAD WITHOUT CONTRAST  CT CERVICAL SPINE WITHOUT CONTRAST  TECHNIQUE: Multidetector CT imaging of the head and cervical spine was performed following the standard protocol without intravenous contrast. Multiplanar CT image reconstructions of the cervical spine were also generated.  COMPARISON:  None.  FINDINGS: CT HEAD FINDINGS  The brain has a normal appearance without evidence of atrophy, infarction, mass lesion, hemorrhage, hydrocephalus or extra-axial collection. The calvarium is unremarkable. The paranasal sinuses, middle ears and mastoids are clear.  CT CERVICAL SPINE FINDINGS  Alignment is normal. No fracture. No soft tissue swelling. There is ordinary spondylosis with anterior osteophytes at C5-6 and C6-7. No significant bony narrowing of the canal or foramina. Regional soft tissues are unremarkable.  IMPRESSION: Normal head CT.  No traumatic or significant cervical finding.   Electronically Signed   By: Nelson Chimes M.D.   On: 09/21/2013 21:03   Ct Cervical Spine Wo Contrast  09/21/2013   CLINICAL DATA:  Syncope.  Trauma to the head and neck.  EXAM: CT HEAD WITHOUT CONTRAST  CT CERVICAL SPINE WITHOUT CONTRAST  TECHNIQUE: Multidetector CT imaging of the head and cervical spine was performed  following the standard protocol without intravenous contrast. Multiplanar CT image reconstructions of the cervical spine were also generated.  COMPARISON:  None.  FINDINGS: CT HEAD FINDINGS  The brain has a normal appearance without evidence of atrophy, infarction, mass lesion, hemorrhage, hydrocephalus or extra-axial collection. The calvarium is  unremarkable. The paranasal sinuses, middle ears and mastoids are clear.  CT CERVICAL SPINE FINDINGS  Alignment is normal. No fracture. No soft tissue swelling. There is ordinary spondylosis with anterior osteophytes at C5-6 and C6-7. No significant bony narrowing of the canal or foramina. Regional soft tissues are unremarkable.  IMPRESSION: Normal head CT.  No traumatic or significant cervical finding.   Electronically Signed   By: Nelson Chimes M.D.   On: 09/21/2013 21:03   Dg Shoulder Left  09/21/2013   CLINICAL DATA:  Status post fall; left shoulder pain.  EXAM: LEFT SHOULDER - 2+ VIEW  COMPARISON:  None.  FINDINGS: There is no evidence of fracture or dislocation. However, the humeral neck is difficult to fully assess on the scapular Y-view. The left humeral head is seated within the glenoid fossa. The acromioclavicular joint is unremarkable in appearance. No significant soft tissue abnormalities are seen. The visualized portions of the left lung are clear.  IMPRESSION: No definite evidence of fracture or dislocation. Though the humeral neck is difficult to fully assess on the scapular Y-view, it appears intact on the frontal view.   Electronically Signed   By: Garald Balding M.D.   On: 09/21/2013 23:06     EKG Interpretation None      MDM   Final diagnoses:  Fall, initial encounter  Musculoskeletal pain   Pt presents with pain in her left shoulder and neck, rt sided face pain after a fall from standing position yesterday.  She reports her knee giving out and falling into a wall.  She denies any dizziness, weakness or syncopal symptoms before or after  the fall.  She received a head and c-spine CT which were negative.  On exam she was tender to palpation over left shoulder and to left side of her neck and thoracic spine.  T spine and shoulder, clavicle films were obtained which were negative.  Pt appears safe to go home as her injuries seem to be musculoskeletal in nature.  Pt was given return precautions, follow-up instructions and discharged with the following meds.   Meds given in ED:  Medications  oxyCODONE-acetaminophen (PERCOCET/ROXICET) 5-325 MG per tablet 1 tablet (1 tablet Oral Given 09/21/13 1936)  oxyCODONE-acetaminophen (PERCOCET/ROXICET) 5-325 MG per tablet 1 tablet (1 tablet Oral Given 09/21/13 2203)  ondansetron (ZOFRAN-ODT) disintegrating tablet 4 mg (4 mg Oral Given 09/21/13 2351)  Tdap (BOOSTRIX) injection 0.5 mL (0.5 mLs Intramuscular Given 09/22/13 0029)    Discharge Medication List as of 09/22/2013 12:36 AM    Ibuprofen 800 mg, Flexeril    [name], NP 09/22/13 [phone]

## 2013-09-23 DIAGNOSIS — E039 Hypothyroidism, unspecified: Secondary | ICD-10-CM | POA: Diagnosis not present

## 2013-09-23 DIAGNOSIS — D509 Iron deficiency anemia, unspecified: Secondary | ICD-10-CM | POA: Diagnosis not present

## 2013-09-23 DIAGNOSIS — R7301 Impaired fasting glucose: Secondary | ICD-10-CM | POA: Diagnosis not present

## 2013-09-23 DIAGNOSIS — R799 Abnormal finding of blood chemistry, unspecified: Secondary | ICD-10-CM | POA: Diagnosis not present

## 2013-09-29 DIAGNOSIS — Z0000 Encounter for general adult medical examination without abnormal findings: Secondary | ICD-10-CM | POA: Diagnosis not present

## 2013-09-29 DIAGNOSIS — R7301 Impaired fasting glucose: Secondary | ICD-10-CM | POA: Diagnosis not present

## 2013-09-29 DIAGNOSIS — E785 Hyperlipidemia, unspecified: Secondary | ICD-10-CM | POA: Diagnosis not present

## 2013-09-29 DIAGNOSIS — Z1212 Encounter for screening for malignant neoplasm of rectum: Secondary | ICD-10-CM | POA: Diagnosis not present

## 2013-09-29 DIAGNOSIS — E039 Hypothyroidism, unspecified: Secondary | ICD-10-CM | POA: Diagnosis not present

## 2013-09-29 DIAGNOSIS — M546 Pain in thoracic spine: Secondary | ICD-10-CM | POA: Diagnosis not present

## 2013-09-29 DIAGNOSIS — G4733 Obstructive sleep apnea (adult) (pediatric): Secondary | ICD-10-CM | POA: Diagnosis not present

## 2013-09-29 DIAGNOSIS — J45909 Unspecified asthma, uncomplicated: Secondary | ICD-10-CM | POA: Diagnosis not present

## 2013-10-01 ENCOUNTER — Other Ambulatory Visit (HOSPITAL_COMMUNITY): Payer: Self-pay | Admitting: Internal Medicine

## 2013-10-01 DIAGNOSIS — Z1231 Encounter for screening mammogram for malignant neoplasm of breast: Secondary | ICD-10-CM

## 2013-10-08 DIAGNOSIS — N62 Hypertrophy of breast: Secondary | ICD-10-CM | POA: Diagnosis not present

## 2013-10-08 DIAGNOSIS — M542 Cervicalgia: Secondary | ICD-10-CM | POA: Diagnosis not present

## 2013-10-08 DIAGNOSIS — M25519 Pain in unspecified shoulder: Secondary | ICD-10-CM | POA: Diagnosis not present

## 2013-10-08 DIAGNOSIS — M549 Dorsalgia, unspecified: Secondary | ICD-10-CM | POA: Diagnosis not present

## 2013-10-09 ENCOUNTER — Telehealth: Payer: Self-pay | Admitting: Internal Medicine

## 2013-10-09 NOTE — Telephone Encounter (Signed)
Pt is aware and nothing more needed at this time.

## 2013-10-09 NOTE — Telephone Encounter (Signed)
IT wont help lung function but couild y make her less dyspneic due to less weight and make her life more comfortable. 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 Lot of people think that the surgery helped her be faster and cause less pain. Please let her know that  Dr. Brand Males, M.D., [facility].C.P Pulmonary and Critical Care Medicine Staff Physician [facility] Pager: [phone], If no answer or between  15:00h - 7:00h: call [phone]  10/09/2013 5:29 PM

## 2013-10-09 NOTE — Telephone Encounter (Signed)
Spoke with the pt  She states that her PCP referred her to plastic surgery at [facility] to see if she needs breast reduction surgery  She states that her doc is asking if MR thinks that this will help with her lung function  Please advise your thoughts on this  Thanks!

## 2013-10-12 ENCOUNTER — Ambulatory Visit (HOSPITAL_COMMUNITY)
Admission: RE | Admit: 2013-10-12 | Discharge: 2013-10-12 | Disposition: A | Discharge status: Home / Self Care | Payer: Medicare Other | Source: Ambulatory Visit | Attending: Internal Medicine | Admitting: Internal Medicine

## 2013-10-12 ENCOUNTER — Other Ambulatory Visit (HOSPITAL_COMMUNITY): Payer: Self-pay | Admitting: Internal Medicine

## 2013-10-12 DIAGNOSIS — Z1231 Encounter for screening mammogram for malignant neoplasm of breast: Secondary | ICD-10-CM | POA: Insufficient documentation

## 2013-10-14 DIAGNOSIS — M542 Cervicalgia: Secondary | ICD-10-CM | POA: Diagnosis not present

## 2013-10-14 DIAGNOSIS — Z6841 Body Mass Index (BMI) 40.0 and over, adult: Secondary | ICD-10-CM | POA: Diagnosis not present

## 2013-10-15 ENCOUNTER — Ambulatory Visit (INDEPENDENT_AMBULATORY_CARE_PROVIDER_SITE_OTHER): Payer: Medicare Other | Admitting: Internal Medicine

## 2013-10-15 ENCOUNTER — Encounter: Payer: Self-pay | Admitting: Internal Medicine

## 2013-10-15 DIAGNOSIS — J45909 Unspecified asthma, uncomplicated: Secondary | ICD-10-CM

## 2013-10-15 DIAGNOSIS — J4540 Moderate persistent asthma, uncomplicated: Secondary | ICD-10-CM

## 2013-10-15 DIAGNOSIS — J45901 Unspecified asthma with (acute) exacerbation: Secondary | ICD-10-CM

## 2013-10-15 DIAGNOSIS — J4541 Moderate persistent asthma with (acute) exacerbation: Secondary | ICD-10-CM

## 2013-10-15 MED ORDER — PREDNISONE 10 MG PO TABS: ORAL_TABLET | ORAL | Status: DC

## 2013-10-15 NOTE — Patient Instructions (Addendum)
#  Asthma  - is acting up due to heat - Take prednisone 40 mg daily x 2 days, then 20mg  daily x 2 days, then 10mg  daily x 2 days, then 5mg  daily x 2 days and stop - Continue dulera and singulair as before; we will ensure refill - Ensure you get flu shot in 2 weeks when better  #WEight management  - We will let Dr Theodoro Kos of [place], [place], [place], [place] Tel [phone] and fax [phone] know that it will absolutely benefit your shortness of breath, asthma and overall health if you had breast reduction surgery   #Followup  7 months Office spirometry (not Tammy Wilson) at followup in 7 months

## 2013-10-15 NOTE — Progress Notes (Signed)
Subjective:    Patient ID: Melissa Rowe, female    DOB: Aug 30, 1965, 48 y.o.   MRN: [id number]  HPI     Dyspnea due to Obesity and Asthma -> 05/05/2008: Spirometry suggests restriction. CXR 04/19/2008  Normal. CT 02/10/2008  - Neg for PE. No infiltrates -> Methacholine Challenge Test: 06/25/2008: Positive PC20 between 1-4  - 10/11/2009: fev1 2L/70% -> start pulmicort  -11/08/2009: fev1 2.24L/78% and better - > switch to QVAR due to cost - May 2012: fev1 2.32L/85% and normal but symptomactic -> change QVAR to dulera sample,  6day pred burst - Jul 13, 2010: Fev1 2.5L/94%, FVC 3.21/98% and improved -> continue dulera -10/26/2010 FEV1 2.33 L/87%, FVC 2.87 L/88% >cont on dulera  - med calendar 10/26/10    HPI Acute visit 12/25/2012 This is a routine followup for asthma but she is acutely ill. 5 days ago her daughter or the birth to a child and she is exposed to many people. The following day she started having fever with sinus congestion, headaches, wheezing, cough, yellow sputum. Symptoms are persisted unchanged. Fever was associated with chills but finally broke 2-3 days ago. However other symptoms still persist along with dyspnea. Initially progressive but no stable but still significant enough that when she walks she is extremely dyspneic. She feels like she needs a nebulizer treatment in the office but she's not taken her for admission. >>Levaquin and Steroid taper /Depo Medrol   01/05/2013 Follow up  Pt presents for a follow up . Seen 2 weeks ago with asthmatic Bronchitis , tx/ Levaquin and steroid taper .  Reports breathing is 75% better since last ov.  still having some chest congestion, cough, wheezing, chest tightness, head congestion, PND.  finished Levaquin, has 2 days left on pred taper.  Feels she has a lot of sinus drainage.  Patient denies any hemoptysis, chest pain, orthopnea, PND, or increased leg swelling.   OV 04/02/2013  Chief Complaint  Patient presents with  .  Follow-up    6 week rov - Head and throat congestion - Chills, sweats - Occas prod cough (creamy white) - SOB - Fatigue - Ches tightness - Occas wheezing   Followup asthma. She has missed a few appointments due to scheduling issues in her family. Now for the past 1 days she's having runny nose and this morning is having scratchy throat associated with possible slight fever or chills last night. There is no associated headache. She's beginning to feel that she might have chest congestion with wheezing develop any moment. She's had a flu shot but she's actually not have the flu itself this season. There no sick contacts. She does not have any wheezing on exam  flut test at bedside: negative  FLu test negative; so no need for tamiflu Likely another virus related URI and heading into asthma flare Continue your regular asthma medications Please take prednisone 40 mg x1 day, then 30 mg x1 day, then 20 mg x1 day, then 10 mg x1 day, and then 5 mg x1 day and stop  take levaquin 500mg  once daily  X 5 days  Followup 3 months or sooner if needed  OV 10/15/2013  Chief Complaint  Patient presents with  . Follow-up    Pt states the weather has made breathing more difficult. Pt c/o chest discomfort with hot weather also.  Pt denies cough.     Followup moderate persistent asthma and multifactorial dyspnea into asthma and obesity  - Overall stable but in the last  few days due to increased heat and humidity she is having increased shortness of breath, chest tightness and wheeze consistent with an asthma exacerbation. She says she is compliant with the Singulair and Dulera. She'll wake up more than the night and using more of the albuterol but there is no colored sputum or fever or chills to suggest an infectious etiology  -In terms of multifactorial dyspnea: At baseline she is struggling a lot with her life because of her morbid obesity with a body mass index over 60 and asthma on top of this. In the past I  would prefer to make dietary changes to lose weight but these have been ineffective. Most recently she is contemplating a twice a day of breast reduction. She is having to ear bra x2 to support her breast. Just the size of the breast alone contributing to dyspnea and significant neck and shoulder strain. Therefore I have supported the Idea of breast reduction is an excellent idea to help her quality of life, symptoms and her asthma control and dyspnea  Review of Systems  Constitutional: Negative for fever and unexpected weight change.  HENT: Negative for congestion, dental problem, ear pain, nosebleeds, postnasal drip, rhinorrhea, sinus pressure, sneezing, sore throat and trouble swallowing.   Eyes: Negative for redness and itching.  Respiratory: Positive for shortness of breath. Negative for cough, chest tightness and wheezing.   Cardiovascular: Negative for palpitations and leg swelling.  Gastrointestinal: Negative for nausea and vomiting.  Genitourinary: Negative for dysuria.  Musculoskeletal: Negative for joint swelling.  Skin: Negative for rash.  Neurological: Negative for headaches.  Hematological: Does not bruise/bleed easily.  Psychiatric/Behavioral: Negative for dysphoric mood. The patient is not nervous/anxious.    Current outpatient prescriptions:albuterol (PROVENTIL HFA;VENTOLIN HFA) 108 (90 BASE) MCG/ACT inhaler, Inhale 2 puffs into the lungs every 6 (six) hours as needed for wheezing or shortness of breath. For shortness of breath., Disp: , Rfl: ;  calcium carbonate (OS-CAL) 600 MG TABS, Take 600 mg by mouth daily. , Disp: , Rfl:  cyclobenzaprine (FLEXERIL) 10 MG tablet, Take 1 tablet (10 mg total) by mouth 2 (two) times daily as needed for muscle spasms., Disp: 20 tablet, Rfl: 0;  ibuprofen (ADVIL,MOTRIN) 800 MG tablet, Take 1 tablet (800 mg total) by mouth 3 (three) times daily., Disp: 21 tablet, Rfl: 0;  levothyroxine (SYNTHROID, LEVOTHROID) 75 MCG tablet, Take 75 mcg by mouth daily  before breakfast., Disp: , Rfl:  mometasone-formoterol (DULERA) 100-5 MCG/ACT AERO, Inhale 2 puffs into the lungs 2 (two) times daily., Disp: , Rfl: ;  montelukast (SINGULAIR) 10 MG tablet, Take 10 mg by mouth daily with breakfast., Disp: , Rfl: ;  montelukast (SINGULAIR) 10 MG tablet, TAKE ONE TABLET BY MOUTH ONCE DAILY, Disp: 30 tablet, Rfl: 0;  Multiple Vitamin (MULTIVITAMIN) capsule, Take 1 capsule by mouth daily. , Disp: , Rfl:  Omeprazole-Sodium Bicarbonate (ZEGERID OTC PO), Take 1 tablet by mouth daily., Disp: , Rfl: ;  Phentermine-Topiramate (QSYMIA) 3.75-23 MG CP24, Take 1 tablet by mouth daily., Disp: , Rfl: ;  polyethylene glycol (MIRALAX / GLYCOLAX) packet, Take 17 g by mouth 3 (three) times daily. , Disp: , Rfl: ;  tolterodine (DETROL LA) 2 MG 24 hr capsule, Take 2 mg by mouth daily., Disp: , Rfl: ;  TRAMADOL HCL PO, Take 1 tablet by mouth., Disp: , Rfl:       Objective:   Physical Exam  Filed Vitals:   10/15/13 1650  BP: 118/82  Pulse: 95  Height: 5\' 6"  (  1.676 m)  Weight: 359 lb (162.841 kg)  SpO2: 95%   als reviewed. Constitutional: She is oriented to person, place, and time. She appears well-developed and well-nourished. No distress.  Body mass index is 57.97 kg/(m^2).    HENT:  Head: Normocephalic and atraumatic.  Right Ear: External ear normal.  Left Ear: External ear normal.  Mouth/Throat: Oropharynx is clear and moist. No oropharyngeal exudate.   Eyes: Conjunctivae and EOM are normal. Pupils are equal, round, and reactive to light. Right eye exhibits no discharge. Left eye exhibits no discharge. No scleral icterus.  Neck: Normal range of motion. Neck supple. No JVD present. No tracheal deviation present. No thyromegaly present.  Cardiovascular: Normal rate, regular rhythm, normal heart sounds and intact distal pulses.  Exam reveals no gallop and no friction rub.   No murmur heard. Pulmonary/Chest: Effort normal and breath sounds normal. No respiratory distress. She  has no wheezes. She has no rales. She exhibits no tenderness.  Abdominal: Soft. Bowel sounds are normal. She exhibits no distension and no mass. There is no tenderness. There is no rebound and no guarding.  Musculoskeletal: Normal range of motion. She exhibits no edema and no tenderness.  Lymphadenopathy:    She has no cervical adenopathy.  Neurological: She is alert and oriented to person, place, and time. She has normal reflexes. No cranial nerve deficit. She exhibits normal muscle tone. Coordination normal.  Skin: Skin is warm and dry. No rash noted. She is not diaphoretic. No erythema. No pallor.  Psychiatric: She has a normal mood and affect. Her behavior is normal. Judgment and thought content normal.         Assessment & Plan:  #Asthma  - is acting up due to heat - Take prednisone 40 mg daily x 2 days, then 20mg  daily x 2 days, then 10mg  daily x 2 days, then 5mg  daily x 2 days and stop - Continue dulera and singulair as before; we will ensure refill - Ensure you get flu shot in 2 weeks when better  #WEight management  - Excellent idea for breast reduction. This will help her shortness of breath, obesity and even asthma control because poorly controlled asthma is linked to obesity  #Followup  7 months

## 2013-11-05 ENCOUNTER — Other Ambulatory Visit: Payer: Self-pay | Admitting: Obstetrics and Gynecology

## 2013-11-05 DIAGNOSIS — Z124 Encounter for screening for malignant neoplasm of cervix: Secondary | ICD-10-CM | POA: Diagnosis not present

## 2013-11-05 DIAGNOSIS — N760 Acute vaginitis: Secondary | ICD-10-CM | POA: Diagnosis not present

## 2013-11-06 LAB — CYTOLOGY - PAP

## 2013-11-25 DIAGNOSIS — H35411 Lattice degeneration of retina, right eye: Secondary | ICD-10-CM | POA: Diagnosis not present

## 2013-11-25 DIAGNOSIS — H2513 Age-related nuclear cataract, bilateral: Secondary | ICD-10-CM | POA: Diagnosis not present

## 2013-12-14 DIAGNOSIS — M1711 Unilateral primary osteoarthritis, right knee: Secondary | ICD-10-CM | POA: Diagnosis not present

## 2013-12-14 DIAGNOSIS — M1712 Unilateral primary osteoarthritis, left knee: Secondary | ICD-10-CM | POA: Diagnosis not present

## 2013-12-15 ENCOUNTER — Encounter (HOSPITAL_COMMUNITY): Payer: Self-pay | Admitting: *Deleted

## 2013-12-15 ENCOUNTER — Other Ambulatory Visit: Payer: Self-pay | Admitting: Plastic Surgery

## 2013-12-15 DIAGNOSIS — N62 Hypertrophy of breast: Secondary | ICD-10-CM

## 2013-12-15 MED ORDER — DEXTROSE 5 % IV SOLN
3.0000 g | INTRAVENOUS | Status: AC
  Administered 2013-12-16: 3 g via INTRAVENOUS
  Filled 2013-12-15: qty 3000

## 2013-12-15 NOTE — H&P (Addendum)
Melissa Rowe is an 48 y.o. female.   Chief Complaint: mammary hyperplasia HPI: The patient is a 48 y.o. female here for a history and physical for mammary hyperplasia which she has had for several years. She has extremely large breasts causing symptoms that include the following: Back pain (upper and lower) and neck pain. She frequently pins bra cups higher on straps for better lift and relief. Notices relief when holding breast up in her hands. Shoulder straps causing grooves, pain occasional skin erosioin requiring padding. Pain medication is sometimes required with motrin and tylenol. She recently had a mammogram which was negative for malignancy. She has a BMI of 56.3. She has significant history and asthma and does use a CPAP nightly. 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  Her breasts are extremely large and fairly symmetric. She has hyperpigmentation of the inframammary area on both sides. The sternal to nipple distance on the right is 51 and the left is 49. She is 5 feet 6 inches tall and weighs 348 pounds. The estimated excess breast tissue to be removed at the time of surgery is 800 grams on the left and 800 grams on the right. She has grade III ptosis. She is currently a 52DDDD.  Past Medical History  Diagnosis Date  . Dyspnea   . Gastroenteritis   . Hyperlipidemia   . Diverticulitis   . Atrial mass   . Hypothyroidism   . Asthma   . Urgency of urination   . Frequency of urination   . GERD (gastroesophageal reflux disease)   . Arthritis   . Environmental allergies   . Obesity   . OSA (obstructive sleep apnea)     wears CPAP night pt does not know settings  . Hemorrhoids     external with some bleeding  . Anemia sept /oct 2014    Past Surgical History  Procedure Laterality Date  . Cholecystectomy  2008  . Polyps removal  2007  . Tubal ligation  1992  . Carpal tunnel release      right  .  Colonoscopy    . Knee arthroscopy  03/18/2012    Procedure: ARTHROSCOPY KNEE;  Surgeon: Mcarthur Rossetti, MD;  Location: [facility];  Service: Orthopedics;  Laterality: Right;  Right knee arthroscopy with debridement and three compartment chondroplasty  . Colonoscopy with propofol N/A 02/24/2013    Procedure: COLONOSCOPY WITH PROPOFOL;  Surgeon: Ladene Artist, MD;  Location: [facility];  Service: Endoscopy;  Laterality: N/A;    Family History  Problem Relation Age of Onset  . Heart murmur Mother     rheumatoid arthritis  . Asthma Brother     and mother both as a child   Social History:  reports that she quit smoking about 23 years ago. Her smoking use included Cigarettes. She has a 14 pack-year smoking history. She has never used smokeless tobacco. She reports that she drinks alcohol. She reports that she does not use illicit drugs.  Allergies: No Known Allergies   (Not in a hospital admission)  No results found for this or any previous visit (from the past 48 hour(s)). No results found.  Review of Systems  Constitutional: Negative.   Eyes: Negative.   Respiratory: Positive for shortness of breath.   Cardiovascular: Negative.   Gastrointestinal: Negative.   Genitourinary: Negative.   Musculoskeletal: Positive for back pain and neck pain.  Skin: Negative.   Psychiatric/Behavioral: Negative.  Last menstrual period 08/28/2013. Physical Exam  Constitutional: She is oriented to person, place, and time. She appears well-developed.  HENT:  Head: Normocephalic and atraumatic.  Eyes: Conjunctivae and EOM are normal. Pupils are equal, round, and reactive to light.  Cardiovascular: Normal rate.   Respiratory: Effort normal.  GI: Soft.  Musculoskeletal: Normal range of motion.  Neurological: She is alert and oriented to person, place, and time.  Skin: Skin is warm.  Psychiatric: She has a normal mood and affect. Her behavior is normal. Judgment and thought content normal.      Assessment/Plan Plan for bilateral breast reduction likely with amputation technique.  SANGER,Manford Sprong 12/15/2013, 10:25 AM

## 2013-12-15 NOTE — Progress Notes (Signed)
No pre-op orders in EPIC. Called Dr. Leafy Ro office and requested orders. Spoke with Debbie.

## 2013-12-16 ENCOUNTER — Encounter (HOSPITAL_COMMUNITY): Payer: Self-pay | Admitting: Plastic Surgery

## 2013-12-16 ENCOUNTER — Observation Stay (HOSPITAL_COMMUNITY)
Admission: RE | Admit: 2013-12-16 | Discharge: 2013-12-17 | Disposition: A | Discharge status: Home / Self Care | Payer: Medicare Other | Source: Ambulatory Visit | Attending: Plastic Surgery | Admitting: Plastic Surgery

## 2013-12-16 ENCOUNTER — Encounter (HOSPITAL_COMMUNITY)
Admission: RE | Disposition: A | Discharge status: Home / Self Care | Payer: Self-pay | Source: Ambulatory Visit | Attending: Plastic Surgery

## 2013-12-16 ENCOUNTER — Ambulatory Visit (HOSPITAL_COMMUNITY): Payer: Medicare Other | Admitting: Anesthesiology

## 2013-12-16 DIAGNOSIS — E039 Hypothyroidism, unspecified: Secondary | ICD-10-CM | POA: Diagnosis not present

## 2013-12-16 DIAGNOSIS — K219 Gastro-esophageal reflux disease without esophagitis: Secondary | ICD-10-CM | POA: Insufficient documentation

## 2013-12-16 DIAGNOSIS — N62 Hypertrophy of breast: Principal | ICD-10-CM | POA: Diagnosis present

## 2013-12-16 DIAGNOSIS — Z87891 Personal history of nicotine dependence: Secondary | ICD-10-CM | POA: Insufficient documentation

## 2013-12-16 DIAGNOSIS — D649 Anemia, unspecified: Secondary | ICD-10-CM | POA: Insufficient documentation

## 2013-12-16 DIAGNOSIS — E785 Hyperlipidemia, unspecified: Secondary | ICD-10-CM | POA: Insufficient documentation

## 2013-12-16 DIAGNOSIS — N6011 Diffuse cystic mastopathy of right breast: Secondary | ICD-10-CM | POA: Diagnosis not present

## 2013-12-16 DIAGNOSIS — J45909 Unspecified asthma, uncomplicated: Secondary | ICD-10-CM | POA: Insufficient documentation

## 2013-12-16 DIAGNOSIS — M1990 Unspecified osteoarthritis, unspecified site: Secondary | ICD-10-CM | POA: Diagnosis not present

## 2013-12-16 DIAGNOSIS — Z6841 Body Mass Index (BMI) 40.0 and over, adult: Secondary | ICD-10-CM | POA: Diagnosis not present

## 2013-12-16 DIAGNOSIS — G4733 Obstructive sleep apnea (adult) (pediatric): Secondary | ICD-10-CM | POA: Diagnosis not present

## 2013-12-16 DIAGNOSIS — E119 Type 2 diabetes mellitus without complications: Secondary | ICD-10-CM | POA: Insufficient documentation

## 2013-12-16 DIAGNOSIS — N6012 Diffuse cystic mastopathy of left breast: Secondary | ICD-10-CM | POA: Diagnosis not present

## 2013-12-16 HISTORY — DX: Unspecified glaucoma: H40.9

## 2013-12-16 HISTORY — DX: Nausea with vomiting, unspecified: R11.2

## 2013-12-16 HISTORY — PX: BREAST REDUCTION SURGERY: SHX8

## 2013-12-16 HISTORY — DX: Other specified postprocedural states: Z98.890

## 2013-12-16 HISTORY — DX: Constipation, unspecified: K59.00

## 2013-12-16 LAB — CBC WITH DIFFERENTIAL/PLATELET
BASOS ABS: 0 10*3/uL (ref 0.0–0.1)
Basophils Relative: 0 % (ref 0–1)
EOS PCT: 2 % (ref 0–5)
Eosinophils Absolute: 0.2 10*3/uL (ref 0.0–0.7)
HEMATOCRIT: 35.4 % — AB (ref 36.0–46.0)
Hemoglobin: 11.5 g/dL — ABNORMAL LOW (ref 12.0–15.0)
LYMPHS ABS: 2.6 10*3/uL (ref 0.7–4.0)
Lymphocytes Relative: 26 % (ref 12–46)
MCH: 20.6 pg — AB (ref 26.0–34.0)
MCHC: 32.5 g/dL (ref 30.0–36.0)
MCV: 63.4 fL — AB (ref 78.0–100.0)
MONO ABS: 0.6 10*3/uL (ref 0.1–1.0)
MONOS PCT: 6 % (ref 3–12)
NEUTROS ABS: 6.6 10*3/uL (ref 1.7–7.7)
Neutrophils Relative %: 66 % (ref 43–77)
Platelets: 330 10*3/uL (ref 150–400)
RBC: 5.58 MIL/uL — ABNORMAL HIGH (ref 3.87–5.11)
RDW: 17.9 % — AB (ref 11.5–15.5)
WBC: 10 10*3/uL (ref 4.0–10.5)

## 2013-12-16 LAB — COMPREHENSIVE METABOLIC PANEL
ALT: 13 U/L (ref 0–35)
AST: 13 U/L (ref 0–37)
Albumin: 3.7 g/dL (ref 3.5–5.2)
Alkaline Phosphatase: 75 U/L (ref 39–117)
Anion gap: 14 (ref 5–15)
BUN: 13 mg/dL (ref 6–23)
CALCIUM: 9.4 mg/dL (ref 8.4–10.5)
CO2: 24 meq/L (ref 19–32)
CREATININE: 0.81 mg/dL (ref 0.50–1.10)
Chloride: 100 mEq/L (ref 96–112)
GFR calc non Af Amer: 86 mL/min — ABNORMAL LOW (ref >90)
Glucose, Bld: 100 mg/dL — ABNORMAL HIGH (ref 70–99)
Potassium: 4 mEq/L (ref 3.7–5.3)
Sodium: 138 mEq/L (ref 137–147)
Total Bilirubin: 0.2 mg/dL — ABNORMAL LOW (ref 0.3–1.2)
Total Protein: 8 g/dL (ref 6.0–8.3)

## 2013-12-16 LAB — BASIC METABOLIC PANEL
ANION GAP: 14 (ref 5–15)
BUN: 10 mg/dL (ref 6–23)
CHLORIDE: 99 meq/L (ref 96–112)
CO2: 24 mEq/L (ref 19–32)
Calcium: 9 mg/dL (ref 8.4–10.5)
Creatinine, Ser: 0.74 mg/dL (ref 0.50–1.10)
Glucose, Bld: 181 mg/dL — ABNORMAL HIGH (ref 70–99)
POTASSIUM: 4.6 meq/L (ref 3.7–5.3)
SODIUM: 137 meq/L (ref 137–147)

## 2013-12-16 LAB — GLUCOSE, CAPILLARY
Glucose-Capillary: 178 mg/dL — ABNORMAL HIGH (ref 70–99)
Glucose-Capillary: 175 mg/dL — ABNORMAL HIGH (ref 70–99)

## 2013-12-16 LAB — CBC
HCT: 31.2 % — ABNORMAL LOW (ref 36.0–46.0)
HEMOGLOBIN: 10.3 g/dL — AB (ref 12.0–15.0)
MCH: 20.9 pg — ABNORMAL LOW (ref 26.0–34.0)
MCHC: 33 g/dL (ref 30.0–36.0)
MCV: 63.3 fL — ABNORMAL LOW (ref 78.0–100.0)
Platelets: 357 10*3/uL (ref 150–400)
RBC: 4.93 MIL/uL (ref 3.87–5.11)
RDW: 17.9 % — ABNORMAL HIGH (ref 11.5–15.5)
WBC: 16.2 10*3/uL — AB (ref 4.0–10.5)

## 2013-12-16 LAB — HEMOGLOBIN A1C
HEMOGLOBIN A1C: 5.9 % — AB (ref <5.7)
MEAN PLASMA GLUCOSE: 123 mg/dL — AB (ref <117)

## 2013-12-16 LAB — HCG, SERUM, QUALITATIVE: Preg, Serum: NEGATIVE

## 2013-12-16 SURGERY — MAMMOPLASTY, REDUCTION
Anesthesia: General | Site: Breast | Laterality: Bilateral

## 2013-12-16 MED ORDER — DEXAMETHASONE SODIUM PHOSPHATE 4 MG/ML IJ SOLN
INTRAMUSCULAR | Status: DC | PRN
  Administered 2013-12-16: 4 mg via INTRAVENOUS

## 2013-12-16 MED ORDER — PROPOFOL 10 MG/ML IV BOLUS
INTRAVENOUS | Status: AC
  Filled 2013-12-16: qty 20

## 2013-12-16 MED ORDER — CYCLOBENZAPRINE HCL 10 MG PO TABS: 10.0000 mg | ORAL_TABLET | Freq: Two times a day (BID) | ORAL | Status: DC | PRN

## 2013-12-16 MED ORDER — HYDROMORPHONE HCL 1 MG/ML IJ SOLN
INTRAMUSCULAR | Status: DC | PRN
  Administered 2013-12-16 (×2): .25 mg via INTRAVENOUS

## 2013-12-16 MED ORDER — BUPIVACAINE-EPINEPHRINE (PF) 0.25% -1:200000 IJ SOLN
INTRAMUSCULAR | Status: AC
  Filled 2013-12-16: qty 30

## 2013-12-16 MED ORDER — FESOTERODINE FUMARATE ER 4 MG PO TB24
4.0000 mg | ORAL_TABLET | Freq: Every day | ORAL | Status: DC
  Administered 2013-12-17 (×2): 4 mg via ORAL
  Filled 2013-12-16 (×2): qty 1

## 2013-12-16 MED ORDER — MONTELUKAST SODIUM 10 MG PO TABS
5.0000 mg | ORAL_TABLET | Freq: Every day | ORAL | Status: DC
  Filled 2013-12-16: qty 0.5

## 2013-12-16 MED ORDER — OXYCODONE HCL 5 MG PO TABS: 5.0000 mg | ORAL_TABLET | Freq: Once | ORAL | Status: DC | PRN

## 2013-12-16 MED ORDER — LACTATED RINGERS IV SOLN
INTRAVENOUS | Status: DC | PRN
  Administered 2013-12-16 (×3): via INTRAVENOUS

## 2013-12-16 MED ORDER — HYDROMORPHONE HCL 1 MG/ML IJ SOLN
1.0000 mg | INTRAMUSCULAR | Status: DC | PRN
  Administered 2013-12-16 – 2013-12-17 (×2): 1 mg via INTRAVENOUS
  Filled 2013-12-16 (×2): qty 1

## 2013-12-16 MED ORDER — MIDAZOLAM HCL 5 MG/5ML IJ SOLN
INTRAMUSCULAR | Status: DC | PRN
  Administered 2013-12-16: 2 mg via INTRAVENOUS

## 2013-12-16 MED ORDER — TRAMADOL HCL 50 MG PO TABS
50.0000 mg | ORAL_TABLET | Freq: Two times a day (BID) | ORAL | Status: DC
  Administered 2013-12-16 – 2013-12-17 (×2): 50 mg via ORAL
  Filled 2013-12-16 (×2): qty 1

## 2013-12-16 MED ORDER — HYDROMORPHONE HCL 1 MG/ML IJ SOLN
0.2500 mg | INTRAMUSCULAR | Status: DC | PRN
  Administered 2013-12-16: 0.5 mg via INTRAVENOUS

## 2013-12-16 MED ORDER — INSULIN ASPART 100 UNIT/ML ~~LOC~~ SOLN
0.0000 [IU] | Freq: Three times a day (TID) | SUBCUTANEOUS | Status: DC
  Administered 2013-12-16: 4 [IU] via SUBCUTANEOUS

## 2013-12-16 MED ORDER — KCL IN DEXTROSE-NACL 20-5-0.45 MEQ/L-%-% IV SOLN
INTRAVENOUS | Status: DC
  Administered 2013-12-16 – 2013-12-17 (×2): via INTRAVENOUS
  Filled 2013-12-16 (×2): qty 1000

## 2013-12-16 MED ORDER — SODIUM CHLORIDE 0.9 % IR SOLN
Status: DC | PRN
  Administered 2013-12-16: 500 mL

## 2013-12-16 MED ORDER — METOPROLOL TARTRATE 1 MG/ML IV SOLN
INTRAVENOUS | Status: DC | PRN
  Administered 2013-12-16 (×2): 2.5 mg via INTRAVENOUS

## 2013-12-16 MED ORDER — FENTANYL CITRATE 0.05 MG/ML IJ SOLN
INTRAMUSCULAR | Status: AC
Start: 2013-12-16 — End: 2013-12-16
  Filled 2013-12-16: qty 5

## 2013-12-16 MED ORDER — SUCCINYLCHOLINE CHLORIDE 20 MG/ML IJ SOLN
INTRAMUSCULAR | Status: DC | PRN
  Administered 2013-12-16: 140 mg via INTRAVENOUS

## 2013-12-16 MED ORDER — INSULIN ASPART 100 UNIT/ML ~~LOC~~ SOLN: 6.0000 [IU] | Freq: Three times a day (TID) | SUBCUTANEOUS | Status: DC

## 2013-12-16 MED ORDER — LACTATED RINGERS IV SOLN
INTRAVENOUS | Status: DC
  Administered 2013-12-16: 10:00:00 via INTRAVENOUS

## 2013-12-16 MED ORDER — DIPHENHYDRAMINE HCL 50 MG/ML IJ SOLN
INTRAMUSCULAR | Status: AC
  Filled 2013-12-16: qty 1

## 2013-12-16 MED ORDER — PREDNISONE 10 MG PO TABS
10.0000 mg | ORAL_TABLET | Freq: Every day | ORAL | Status: DC
  Administered 2013-12-17: 10 mg via ORAL
  Filled 2013-12-16 (×2): qty 1

## 2013-12-16 MED ORDER — ONDANSETRON HCL 4 MG/2ML IJ SOLN
4.0000 mg | Freq: Four times a day (QID) | INTRAMUSCULAR | Status: DC | PRN
  Administered 2013-12-16: 4 mg via INTRAVENOUS
  Filled 2013-12-16: qty 2

## 2013-12-16 MED ORDER — HYDROMORPHONE HCL 1 MG/ML IJ SOLN
INTRAMUSCULAR | Status: AC
  Filled 2013-12-16: qty 1

## 2013-12-16 MED ORDER — MIDAZOLAM HCL 2 MG/2ML IJ SOLN
INTRAMUSCULAR | Status: AC
  Filled 2013-12-16: qty 2

## 2013-12-16 MED ORDER — PROPOFOL 10 MG/ML IV BOLUS
INTRAVENOUS | Status: DC | PRN
  Administered 2013-12-16: 20 mg via INTRAVENOUS
  Administered 2013-12-16: 170 mg via INTRAVENOUS

## 2013-12-16 MED ORDER — FENTANYL CITRATE 0.05 MG/ML IJ SOLN
INTRAMUSCULAR | Status: DC | PRN
  Administered 2013-12-16: 50 ug via INTRAVENOUS
  Administered 2013-12-16: 150 ug via INTRAVENOUS
  Administered 2013-12-16 (×4): 50 ug via INTRAVENOUS
  Administered 2013-12-16: 150 ug via INTRAVENOUS
  Administered 2013-12-16: 100 ug via INTRAVENOUS
  Administered 2013-12-16 (×2): 50 ug via INTRAVENOUS

## 2013-12-16 MED ORDER — ONDANSETRON HCL 4 MG/2ML IJ SOLN
INTRAMUSCULAR | Status: DC | PRN
  Administered 2013-12-16: 4 mg via INTRAVENOUS

## 2013-12-16 MED ORDER — FENTANYL CITRATE 0.05 MG/ML IJ SOLN
INTRAMUSCULAR | Status: AC
  Filled 2013-12-16: qty 5

## 2013-12-16 MED ORDER — OXYCODONE HCL 5 MG/5ML PO SOLN: 5.0000 mg | Freq: Once | ORAL | Status: DC | PRN

## 2013-12-16 MED ORDER — HYDROCODONE-ACETAMINOPHEN 5-325 MG PO TABS
1.0000 | ORAL_TABLET | ORAL | Status: DC | PRN
  Administered 2013-12-17: 2 via ORAL
  Filled 2013-12-16: qty 2

## 2013-12-16 MED ORDER — DIPHENHYDRAMINE HCL 50 MG/ML IJ SOLN
INTRAMUSCULAR | Status: DC | PRN
  Administered 2013-12-16: 10 mg via INTRAVENOUS

## 2013-12-16 MED ORDER — DEXAMETHASONE SODIUM PHOSPHATE 4 MG/ML IJ SOLN
INTRAMUSCULAR | Status: AC
  Filled 2013-12-16: qty 1

## 2013-12-16 MED ORDER — ONDANSETRON HCL 4 MG/2ML IJ SOLN
4.0000 mg | Freq: Four times a day (QID) | INTRAMUSCULAR | Status: DC | PRN
Start: 2013-12-16 — End: 2013-12-16

## 2013-12-16 MED ORDER — 0.9 % SODIUM CHLORIDE (POUR BTL) OPTIME
TOPICAL | Status: DC | PRN
  Administered 2013-12-16: 1000 mL

## 2013-12-16 MED ORDER — SODIUM CHLORIDE 0.9 % IV SOLN
3.0000 g | Freq: Four times a day (QID) | INTRAVENOUS | Status: DC
  Administered 2013-12-16 – 2013-12-17 (×3): 3 g via INTRAVENOUS
  Filled 2013-12-16 (×5): qty 3

## 2013-12-16 MED ORDER — BUPIVACAINE-EPINEPHRINE 0.25% -1:200000 IJ SOLN
INTRAMUSCULAR | Status: DC | PRN
  Administered 2013-12-16: 1 mL
  Administered 2013-12-16: 30 mL

## 2013-12-16 MED ORDER — POLYETHYLENE GLYCOL 3350 17 G PO PACK
17.0000 g | PACK | Freq: Two times a day (BID) | ORAL | Status: DC
  Administered 2013-12-17: 17 g via ORAL
  Filled 2013-12-16 (×3): qty 1

## 2013-12-16 MED ORDER — LEVOTHYROXINE SODIUM 75 MCG PO TABS
75.0000 ug | ORAL_TABLET | Freq: Every day | ORAL | Status: DC
  Administered 2013-12-17: 75 ug via ORAL
  Filled 2013-12-16 (×2): qty 1

## 2013-12-16 MED ORDER — LIDOCAINE HCL (CARDIAC) 20 MG/ML IV SOLN
INTRAVENOUS | Status: DC | PRN
  Administered 2013-12-16: 70 mg via INTRAVENOUS

## 2013-12-16 SURGICAL SUPPLY — 66 items
BAG DECANTER FOR FLEXI CONT (MISCELLANEOUS) ×3 IMPLANT
BINDER BREAST 3XL (BIND) ×3 IMPLANT
BINDER BREAST MEDIUM (GAUZE/BANDAGES/DRESSINGS) ×3 IMPLANT
BIOPATCH RED 1 DISK 7.0 (GAUZE/BANDAGES/DRESSINGS) ×4 IMPLANT
BIOPATCH RED 1IN DISK 7.0MM (GAUZE/BANDAGES/DRESSINGS) ×2
BLADE HEX COATED 2.75 (ELECTRODE) ×3 IMPLANT
BLADE SURG 10 STRL SS (BLADE) ×27 IMPLANT
BNDG GAUZE ELAST 4 BULKY (GAUZE/BANDAGES/DRESSINGS) IMPLANT
CANISTER SUCTION 1200CC (MISCELLANEOUS) ×3 IMPLANT
CHLORAPREP W/TINT 26ML (MISCELLANEOUS) ×9 IMPLANT
CONT SPEC 4OZ CLIKSEAL STRL BL (MISCELLANEOUS) ×3 IMPLANT
COTTONBALL LRG STERILE PKG (GAUZE/BANDAGES/DRESSINGS) ×3 IMPLANT
DECANTER SPIKE VIAL GLASS SM (MISCELLANEOUS) IMPLANT
DERMABOND ADVANCED (GAUZE/BANDAGES/DRESSINGS) ×2
DERMABOND ADVANCED .7 DNX12 (GAUZE/BANDAGES/DRESSINGS) ×1 IMPLANT
DRAIN CHANNEL 19F RND (DRAIN) ×6 IMPLANT
DRAPE LAPAROSCOPIC ABDOMINAL (DRAPES) ×3 IMPLANT
DRAPE UTILITY XL STRL (DRAPES) ×6 IMPLANT
DRSG PAD ABDOMINAL 8X10 ST (GAUZE/BANDAGES/DRESSINGS) ×3 IMPLANT
ELECT BLADE 4.0 EZ CLEAN MEGAD (MISCELLANEOUS) ×3
ELECT CAUTERY BLADE 6.4 (BLADE) ×3 IMPLANT
ELECT REM PT RETURN 9FT ADLT (ELECTROSURGICAL) ×3
ELECTRODE BLDE 4.0 EZ CLN MEGD (MISCELLANEOUS) ×1 IMPLANT
ELECTRODE REM PT RTRN 9FT ADLT (ELECTROSURGICAL) ×1 IMPLANT
EVACUATOR SILICONE 100CC (DRAIN) ×6 IMPLANT
GAUZE SPONGE 4X4 12PLY STRL (GAUZE/BANDAGES/DRESSINGS) ×3 IMPLANT
GAUZE XEROFORM 5X9 LF (GAUZE/BANDAGES/DRESSINGS) ×3 IMPLANT
GLOVE BIO SURGEON STRL SZ 6 (GLOVE) ×3 IMPLANT
GLOVE BIO SURGEON STRL SZ 6.5 (GLOVE) ×10 IMPLANT
GLOVE BIO SURGEON STRL SZ7.5 (GLOVE) ×3 IMPLANT
GLOVE BIO SURGEONS STRL SZ 6.5 (GLOVE) ×5
GLOVE BIOGEL PI IND STRL 7.5 (GLOVE) ×2 IMPLANT
GLOVE BIOGEL PI INDICATOR 7.5 (GLOVE) ×4
GLOVE SURG SS PI 6.5 STRL IVOR (GLOVE) ×3 IMPLANT
GOWN STRL REUS W/ TWL LRG LVL3 (GOWN DISPOSABLE) ×4 IMPLANT
GOWN STRL REUS W/TWL LRG LVL3 (GOWN DISPOSABLE) ×8
LIQUID BAND (GAUZE/BANDAGES/DRESSINGS) ×3 IMPLANT
NEEDLE 22X1 1/2 (OR ONLY) (NEEDLE) ×3 IMPLANT
NEEDLE HYPO 25X1 1.5 SAFETY (NEEDLE) IMPLANT
NEEDLE SPNL 22GX3.5 QUINCKE BK (NEEDLE) ×3 IMPLANT
NS IRRIG 1000ML POUR BTL (IV SOLUTION) ×3 IMPLANT
PACK GENERAL/GYN (CUSTOM PROCEDURE TRAY) ×3 IMPLANT
PACK UNIVERSAL I (CUSTOM PROCEDURE TRAY) ×3 IMPLANT
PIN SAFETY STERILE (MISCELLANEOUS) ×3 IMPLANT
SPONGE LAP 18X18 X RAY DECT (DISPOSABLE) ×18 IMPLANT
SUT MNCRL AB 3-0 PS2 18 (SUTURE) ×21 IMPLANT
SUT MNCRL AB 4-0 PS2 18 (SUTURE) ×24 IMPLANT
SUT MON AB 5-0 PS2 18 (SUTURE) ×27 IMPLANT
SUT SILK 3 0 PS 1 (SUTURE) IMPLANT
SUT SILK 3 0 SH CR/8 (SUTURE) ×3 IMPLANT
SUT SILK 4 0 SH CR/8 (SUTURE) ×9 IMPLANT
SUT VIC AB 3-0 SH 27 (SUTURE)
SUT VIC AB 3-0 SH 27X BRD (SUTURE) IMPLANT
SUT VICRYL 4-0 PS2 18IN ABS (SUTURE) IMPLANT
SYR 50ML LL SCALE MARK (SYRINGE) ×6 IMPLANT
SYR BULB IRRIGATION 50ML (SYRINGE) ×3 IMPLANT
SYR CONTROL 10ML LL (SYRINGE) ×3 IMPLANT
TAPE VIPERTRACK RADIOPAQ 30X (MISCELLANEOUS) ×1 IMPLANT
TAPE VIPERTRACK RADIOPAQUE (MISCELLANEOUS) ×2
TOWEL OR 17X24 6PK STRL BLUE (TOWEL DISPOSABLE) ×3 IMPLANT
TOWEL OR 17X26 10 PK STRL BLUE (TOWEL DISPOSABLE) ×3 IMPLANT
TRAY CATH 16FR W/PLASTIC CATH (SET/KITS/TRAYS/PACK) ×3 IMPLANT
TUBE CONNECTING 20'X1/4 (TUBING) ×1
TUBE CONNECTING 20X1/4 (TUBING) ×2 IMPLANT
UNDERPAD 30X30 INCONTINENT (UNDERPADS AND DIAPERS) IMPLANT
YANKAUER SUCT BULB TIP NO VENT (SUCTIONS) ×3 IMPLANT

## 2013-12-16 NOTE — Interval H&P Note (Signed)
History and Physical Interval Note:  12/16/2013 11:08 AM  Melissa Rowe  has presented today for surgery, with the diagnosis of BILATERAL HYPERTROPHY OF BREAST  The various methods of treatment have been discussed with the patient and family. After consideration of risks, benefits and other options for treatment, the patient has consented to  Procedure(s): BILATERAL BREAT REDUCTION (Bilateral) as a surgical intervention .  The patient's history has been reviewed, patient examined, no change in status, stable for surgery.  I have reviewed the patient's chart and labs.  Questions were answered to the patient's satisfaction.     SANGER,Dallys Nowakowski

## 2013-12-16 NOTE — Brief Op Note (Signed)
12/16/2013  4:16 PM  PATIENT:  [name]  48 y.o. female  PRE-OPERATIVE DIAGNOSIS:  HYPERTROPHY OF BILATERAL BREASTS  POST-OPERATIVE DIAGNOSIS:  HYPERTROPHY OF BILATERAL BREASTS  PROCEDURE:  Procedure(s): BILATERAL BREAST REDUCTION (Bilateral) Right - 3454 and left 2930 gm  SURGEON:  Surgeon(s) and Role:    * Claire Sanger, DO - Primary  PHYSICIAN ASSISTANT: Shawn Rayburn, PA ASSISTANTS: none   ANESTHESIA:   general  EBL:  Total I/O In: 2000 [I.V.:2000] Out: 350 [Blood:350]  BLOOD ADMINISTERED:none  DRAINS: (2) Jackson-Pratt drain(s) with closed bulb suction in the breast pocket (one on each side)   LOCAL MEDICATIONS USED:  MARCAINE     SPECIMEN:  Source of Specimen:  bilateral breast tissue  DISPOSITION OF SPECIMEN:  PATHOLOGY  COUNTS:  YES  TOURNIQUET:  * No tourniquets in log *  DICTATION: .Dragon Dictation  PLAN OF CARE: Admit for overnight observation  PATIENT DISPOSITION:  PACU - hemodynamically stable.   Delay start of Pharmacological VTE agent (>24hrs) due to surgical blood loss or risk of bleeding: no

## 2013-12-16 NOTE — Anesthesia Preprocedure Evaluation (Signed)
Anesthesia Evaluation  Patient identified by MRN, date of birth, ID band Patient awake    Reviewed: Allergy & Precautions, H&P , NPO status , Patient's Chart, lab work & pertinent test results  History of Anesthesia Complications (+) PONV  Airway Mallampati: II   Neck ROM: full    Dental   Pulmonary shortness of breath, asthma , sleep apnea , former smoker,          Cardiovascular negative cardio ROS      Neuro/Psych    GI/Hepatic GERD-  ,  Endo/Other  Hypothyroidism Morbid obesity  Renal/GU      Musculoskeletal  (+) Arthritis -,   Abdominal   Peds  Hematology  (+) anemia ,   Anesthesia Other Findings   Reproductive/Obstetrics                             Anesthesia Physical Anesthesia Plan  ASA: II  Anesthesia Plan: General   Post-op Pain Management:    Induction: Intravenous  Airway Management Planned: Oral ETT  Additional Equipment:   Intra-op Plan:   Post-operative Plan: Extubation in OR  Informed Consent: I have reviewed the patients History and Physical, chart, labs and discussed the procedure including the risks, benefits and alternatives for the proposed anesthesia with the patient or authorized representative who has indicated his/her understanding and acceptance.     Plan Discussed with: CRNA, Anesthesiologist and Surgeon  Anesthesia Plan Comments:         Anesthesia Quick Evaluation

## 2013-12-16 NOTE — Anesthesia Postprocedure Evaluation (Signed)
  Anesthesia Post-op Note  Patient: Melissa Rowe  Procedure(s) Performed: Procedure(s): BILATERAL BREAST REDUCTION (Bilateral)  Patient Location: PACU  Anesthesia Type: General   Level of Consciousness: awake, alert  and oriented  Airway and Oxygen Therapy: Patient Spontanous Breathing  Post-op Pain: mild  Post-op Assessment: Post-op Vital signs reviewed  Post-op Vital Signs: Reviewed  Last Vitals:  Filed Vitals:   12/16/13 1638  BP:   Pulse:   Temp: 36.6 C  Resp:     Complications: No apparent anesthesia complications

## 2013-12-16 NOTE — Anesthesia Procedure Notes (Signed)
Procedure Name: Intubation Date/Time: 12/16/2013 12:04 PM Performed by: Shirlyn Goltz Pre-anesthesia Checklist: Patient identified, Emergency Drugs available, Suction available and Patient being monitored Patient Re-evaluated:Patient Re-evaluated prior to inductionOxygen Delivery Method: Circle system utilized Preoxygenation: Pre-oxygenation with 100% oxygen Intubation Type: IV induction Ventilation: Mask ventilation with difficulty Laryngoscope Size: Mac and 4 Grade View: Grade II Tube type: Oral Tube size: 7.0 mm Number of attempts: 1 Airway Equipment and Method: Stylet Placement Confirmation: ETT inserted through vocal cords under direct vision,  positive ETCO2 and breath sounds checked- equal and bilateral Secured at: 22 cm Tube secured with: Tape Dental Injury: Teeth and Oropharynx as per pre-operative assessment

## 2013-12-16 NOTE — Transfer of Care (Signed)
Immediate Anesthesia Transfer of Care Note  Patient: Melissa Rowe  Procedure(s) Performed: Procedure(s): BILATERAL BREAST REDUCTION (Bilateral)  Patient Location: PACU  Anesthesia Type:General  Level of Consciousness: awake, alert  and oriented  Airway & Oxygen Therapy: Patient connected to face mask oxygen  Post-op Assessment: Report given to PACU RN  Post vital signs: stable  Complications: No apparent anesthesia complications

## 2013-12-16 NOTE — H&P (View-Only) (Signed)
Melissa Rowe is an 48 y.o. female.   Chief Complaint: mammary hyperplasia HPI: The patient is a 48 y.o. female here for a history and physical for mammary hyperplasia which she has had for several years. She has extremely large breasts causing symptoms that include the following: Back pain (upper and lower) and neck pain. She frequently pins bra cups higher on straps for better lift and relief. Notices relief when holding breast up in her hands. Shoulder straps causing grooves, pain occasional skin erosioin requiring padding. Pain medication is sometimes required with motrin and tylenol. She recently had a mammogram which was negative for malignancy. She has a BMI of 56.3. She has significant history and asthma and does use a CPAP nightly. 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  Her breasts are extremely large and fairly symmetric. She has hyperpigmentation of the inframammary area on both sides. The sternal to nipple distance on the right is 51 and the left is 49. She is 5 feet 6 inches tall and weighs 348 pounds. The estimated excess breast tissue to be removed at the time of surgery is 800 grams on the left and 800 grams on the right. She has grade III ptosis. She is currently a 50DDDD.  Past Medical History  Diagnosis Date  . Dyspnea   . Gastroenteritis   . Hyperlipidemia   . Diverticulitis   . Atrial mass   . Hypothyroidism   . Asthma   . Urgency of urination   . Frequency of urination   . GERD (gastroesophageal reflux disease)   . Arthritis   . Environmental allergies   . Obesity   . OSA (obstructive sleep apnea)     wears CPAP night pt does not know settings  . Hemorrhoids     external with some bleeding  . Anemia sept /oct 2014    Past Surgical History  Procedure Laterality Date  . Cholecystectomy  2008  . Polyps removal  2007  . Tubal ligation  1992  . Carpal tunnel release      right  .  Colonoscopy    . Knee arthroscopy  03/18/2012    Procedure: ARTHROSCOPY KNEE;  Surgeon: Mcarthur Rossetti, MD;  Location: [facility];  Service: Orthopedics;  Laterality: Right;  Right knee arthroscopy with debridement and three compartment chondroplasty  . Colonoscopy with propofol N/A 02/24/2013    Procedure: COLONOSCOPY WITH PROPOFOL;  Surgeon: Ladene Artist, MD;  Location: [facility];  Service: Endoscopy;  Laterality: N/A;    Family History  Problem Relation Age of Onset  . Heart murmur Mother     rheumatoid arthritis  . Asthma Brother     and mother both as a child   Social History:  reports that she quit smoking about 23 years ago. Her smoking use included Cigarettes. She has a 14 pack-year smoking history. She has never used smokeless tobacco. She reports that she drinks alcohol. She reports that she does not use illicit drugs.  Allergies: No Known Allergies   (Not in a hospital admission)  No results found for this or any previous visit (from the past 48 hour(s)). No results found.  Review of Systems  Constitutional: Negative.   Eyes: Negative.   Respiratory: Positive for shortness of breath.   Cardiovascular: Negative.   Gastrointestinal: Negative.   Genitourinary: Negative.   Musculoskeletal: Positive for back pain and neck pain.  Skin: Negative.   Psychiatric/Behavioral: Negative.  Last menstrual period 08/28/2013. Physical Exam  Constitutional: She is oriented to person, place, and time. She appears well-developed.  HENT:  Head: Normocephalic and atraumatic.  Eyes: Conjunctivae and EOM are normal. Pupils are equal, round, and reactive to light.  Cardiovascular: Normal rate.   Respiratory: Effort normal.  GI: Soft.  Musculoskeletal: Normal range of motion.  Neurological: She is alert and oriented to person, place, and time.  Skin: Skin is warm.  Psychiatric: She has a normal mood and affect. Her behavior is normal. Judgment and thought content normal.      Assessment/Plan Plan for bilateral breast reduction likely with amputation technique.  SANGER,Linzie Boursiquot 12/15/2013, 10:25 AM

## 2013-12-17 ENCOUNTER — Encounter (HOSPITAL_COMMUNITY): Payer: Self-pay | Admitting: Plastic Surgery

## 2013-12-17 DIAGNOSIS — N62 Hypertrophy of breast: Secondary | ICD-10-CM | POA: Diagnosis not present

## 2013-12-17 LAB — HEMOGLOBIN A1C
HEMOGLOBIN A1C: 5.7 % — AB (ref <5.7)
MEAN PLASMA GLUCOSE: 117 mg/dL — AB (ref <117)

## 2013-12-17 LAB — GLUCOSE, CAPILLARY
GLUCOSE-CAPILLARY: 120 mg/dL — AB (ref 70–99)
Glucose-Capillary: 108 mg/dL — ABNORMAL HIGH (ref 70–99)

## 2013-12-17 MED ORDER — DIAZEPAM 2 MG PO TABS: 2.0000 mg | ORAL_TABLET | Freq: Four times a day (QID) | ORAL | Status: DC | PRN

## 2013-12-17 MED ORDER — HYDROCODONE-ACETAMINOPHEN 5-325 MG PO TABS: 1.0000 | ORAL_TABLET | ORAL | Status: DC | PRN

## 2013-12-17 NOTE — Progress Notes (Signed)
Discharge Note. Discharge instructions reviewed with the patient. Pt was able to verbalize understanding. Taught patient how to empty JP drains and to document the output. 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 Pt did not have any further questions at this time.

## 2013-12-17 NOTE — Progress Notes (Signed)
1 Day Post-Op  Subjective: Doing well this am except still having a lot of pain and requiring IV pain medications.  Will try some oral Valium as some of her pain is from muscle spasms in shoulders.  Bilateral breast flaps appear viable. Mild erythema about skin on the left side. Buttresses over NAC remain in place. Some bloody drainage about the JP drain sites.   Objective: Vital signs in last 24 hours: Temp:  [97.5 F (36.4 C)-98.2 F (36.8 C)] 97.6 F (36.4 C) (11/05 9811) Pulse Rate:  [81-97] 84 (11/05 0637) Resp:  [10-21] 16 (11/05 0637) BP: (115-157)/(74-95) 115/74 mmHg (11/05 0637) SpO2:  [92 %-100 %] 100 % (11/05 9147) Weight:  [162.841 kg (359 lb)] 162.841 kg (359 lb) (11/04 1815) Last BM Date: 12/15/13  Intake/Output from previous day: 11/04 0701 - 11/05 0700 In: 3066.7 [I.V.:3066.7] Out: 2870 [Urine:2400; Drains:120; Blood:350] Intake/Output this shift:    General appearance: alert, cooperative and mild distress Resp: clear to auscultation bilaterally Cardio: regular rate and rhythm Bilateral breasts viable and without hematoma or infection. Some mild erythema about the left breast incision.   Lab Results:   Recent Labs  12/16/13 0910 12/16/13 1730  WBC 10.0 16.2*  HGB 11.5* 10.3*  HCT 35.4* 31.2*  PLT 330 357   BMET  Recent Labs  12/16/13 0910 12/16/13 1730  NA 138 137  K 4.0 4.6  CL 100 99  CO2 24 24  GLUCOSE 100* 181*  BUN 13 10  CREATININE 0.81 0.74  CALCIUM 9.4 9.0   PT/INR No results for input(s): LABPROT, INR in the last 72 hours. ABG No results for input(s): PHART, HCO3 in the last 72 hours.  Invalid input(s): PCO2, PO2  Studies/Results: No results found.  Anti-infectives: Anti-infectives    Start     Dose/Rate Route Frequency Ordered Stop   12/16/13 1830  Ampicillin-Sulbactam (UNASYN) 3 g in sodium chloride 0.9 % 100 mL IVPB     3 g100 mL/hr over 60 Minutes Intravenous Every 6 hours 12/16/13 1815     12/16/13 1242  polymyxin B  500,000 Units, bacitracin 50,000 Units in sodium chloride irrigation 0.9 % 500 mL irrigation  Status:  Discontinued       As needed 12/16/13 1243 12/16/13 1635   12/16/13 0600  ceFAZolin (ANCEF) 3 g in dextrose 5 % 50 mL IVPB     3 g160 mL/hr over 30 Minutes Intravenous On call to O.R. 12/15/13 1348 12/16/13 1215      Assessment/Plan: s/p Procedure(s): BILATERAL BREAST REDUCTION (Bilateral) Discharge home today if pain under better control and mobilizing well.  She has her prescriptions for post operative medications already given at office.  follow up next Tuesday in office.   LOS: 1 day    [facility] [phone]

## 2013-12-17 NOTE — Discharge Summary (Signed)
Physician Discharge Summary  Patient ID: LAN ENTSMINGER MRN: [id number] DOB/AGE: October 26, 1965 48 y.o.  Admit date: 12/16/2013 Discharge date: 12/17/2013  Admission Diagnoses:Symptomatic mammary hypertrophy  Discharge Diagnoses:  Active Problems:   Symptomatic mammary hypertrophy   Discharged Condition: good  Hospital Course: HPI: The patient is a 48 y.o. female here for admitted for bilateral breast reduction for mammary hyperplasia which she has had for several years. She has extremely large breasts causing symptoms that include the following: Back pain (upper and lower) and neck pain. She frequently pins bra cups higher on straps for better lift and relief. Notices relief when holding breast up in her hands. Shoulder straps causing grooves, pain occasional skin erosioin requiring padding. Pain medication is sometimes required with motrin and tylenol. She recently had a mammogram which was negative for malignancy. She has a BMI of 56.3. She has significant history and asthma and does use a CPAP nightly. 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  She was taken to the OR for bilateral breast reduction with removal of 3454 grams on the right and 2930 grams on the left with tissue sent to pathology. She is stable post operatively and will likely be discharged later today with her husband. She will follow up next week in the office.   Consults: None   Treatments: surgery: PROCEDURE:  Procedure(s): BILATERAL BREAST REDUCTION (Bilateral) Right - 3454 and left 2930 gm   Discharge Exam: Blood pressure 115/74, pulse 84, temperature 97.6 F (36.4 C), temperature source Oral, resp. rate 16, height 5\' 6"  (1.676 m), weight 162.841 kg (359 lb), last menstrual period 12/03/2013, SpO2 100 %. General appearance: alert, cooperative, mild distress and morbidly obese Resp: clear to auscultation bilaterally Cardio: regular rate  and rhythm Bilateral breasts are viable and without signs of hematoma or infection. bilateral NAC buttresses are intact. Mild erythema of the left breast inverted T incision line but otherwise tissue viable.   JP drainage as expected with some bloody drainage about the drain sites as expected.   Disposition: 01-Home or Self Care     Medication List    TAKE these medications        albuterol 108 (90 BASE) MCG/ACT inhaler  Commonly known as:  PROVENTIL HFA;VENTOLIN HFA  Inhale 2 puffs into the lungs every 6 (six) hours as needed for wheezing or shortness of breath. For shortness of breath.     calcium carbonate 600 MG Tabs tablet  Commonly known as:  OS-CAL  Take 600 mg by mouth daily.     calcium-vitamin D 500-200 MG-UNIT per tablet  Commonly known as:  OSCAL WITH D  Take 1 tablet by mouth.     cyclobenzaprine 10 MG tablet  Commonly known as:  FLEXERIL  Take 1 tablet (10 mg total) by mouth 2 (two) times daily as needed for muscle spasms.     DETROL LA 2 MG 24 hr capsule  Generic drug:  tolterodine  Take 2 mg by mouth daily.     diazepam 2 MG tablet  Commonly known as:  VALIUM  Take 1 tablet (2 mg total) by mouth every 6 (six) hours as needed for muscle spasms.     ferrous sulfate 325 (65 FE) MG tablet  Take 325 mg by mouth daily with breakfast.     HYDROcodone-acetaminophen 5-325 MG per tablet  Commonly known as:  NORCO/VICODIN  Take 1-2 tablets by mouth every 4 (four) hours as needed  for moderate pain.     ibuprofen 800 MG tablet  Commonly known as:  ADVIL,MOTRIN  Take 1 tablet (800 mg total) by mouth 3 (three) times daily.     levothyroxine 75 MCG tablet  Commonly known as:  SYNTHROID, LEVOTHROID  Take 75 mcg by mouth daily before breakfast.     levothyroxine 100 MCG tablet  Commonly known as:  SYNTHROID, LEVOTHROID  Take 100 mcg by mouth daily before breakfast.     mometasone-formoterol 100-5 MCG/ACT Aero  Commonly known as:  DULERA  Inhale 2 puffs into the  lungs 2 (two) times daily.     montelukast 10 MG tablet  Commonly known as:  SINGULAIR  Take 10 mg by mouth daily with breakfast.     montelukast 10 MG tablet  Commonly known as:  SINGULAIR  TAKE ONE TABLET BY MOUTH ONCE DAILY     multivitamin capsule  Take 1 capsule by mouth daily.     naproxen 500 MG tablet  Commonly known as:  NAPROSYN  Take 500 mg by mouth 2 (two) times daily as needed for mild pain.     polyethylene glycol packet  Commonly known as:  MIRALAX / GLYCOLAX  Take 17 g by mouth 2 (two) times daily.     predniSONE 10 MG tablet  Commonly known as:  DELTASONE  Take 4 tabs po x 2 days, then 2 x 2 days, then 1 x 2, then 1/2 x 2 days then stop.     QSYMIA 3.75-23 MG Cp24  Generic drug:  Phentermine-Topiramate  Take 1 tablet by mouth daily.     spironolactone 25 MG tablet  Commonly known as:  ALDACTONE  Take 25 mg by mouth daily.     TRAMADOL HCL PO  Take 1 tablet by mouth.     traMADol 50 MG tablet  Commonly known as:  ULTRAM  Take 50 mg by mouth 3 (three) times daily as needed for moderate pain.     Omeprazole-Sodium Bicarbonate 20-1100 MG Caps capsule  Commonly known as:  ZEGERID  Take 1 capsule by mouth daily before breakfast.     ZEGERID OTC PO  Take 1 tablet by mouth daily.           Follow-up Information    Follow up with [facility], DO In 1 week.   Specialty:  Plastic Surgery   Contact information:   [place] [address] [phone]       Signed: [facility] [phone]

## 2013-12-17 NOTE — Discharge Instructions (Signed)
Continue ABD pads to incisional areas and around JP drain sites.  Record output from JP drains Continue breast binder at all times No showering until seen in follow up in the office next week No lifting more than 5 lbs with upper extremities

## 2013-12-17 NOTE — Plan of Care (Signed)
Problem: Phase I Progression Outcomes Goal: Voiding-avoid urinary catheter unless indicated Outcome: Completed/Met Date Met:  12/17/13     

## 2013-12-18 DIAGNOSIS — N62 Hypertrophy of breast: Secondary | ICD-10-CM | POA: Diagnosis not present

## 2013-12-18 DIAGNOSIS — M549 Dorsalgia, unspecified: Secondary | ICD-10-CM | POA: Diagnosis not present

## 2013-12-18 DIAGNOSIS — M542 Cervicalgia: Secondary | ICD-10-CM | POA: Diagnosis not present

## 2013-12-18 NOTE — Op Note (Addendum)
Breast Reduction Op note:    DATE OF PROCEDURE: 12/18/2013  LOCATION: [facility]  SURGEON: Lyndee Leo Sanger  ASSISTANT: Shawn Rayburn, PA  PREOPERATIVE DIAGNOSIS 1. Macromastia 2. Neck Pain 3. Back Pain  POSTOPERATIVE DIAGNOSIS 1. Macromastia 2. Neck Pain 3. Back Pain  PROCEDURES 1. Bilateral breast reduction.  Right reduction 3454g, Left reduction 3295J  COMPLICATIONS: None.  DRAINS: one on each side  INDICATIONS FOR PROCEDURE The patient, [name], is a 48 y.o. year-old female with a history of symptomatic macromastia with concominant back pain, neck pain, shoulder grooving from her bra.   MRN: [id number]  CONSENT Informed consent was obtained directly from the patient. The risks, benefits and alternatives were fully discussed. Specific risks including but not limited to bleeding, infection, hematoma, seroma, scarring, pain, nipple necrosis, asymmetry, poor cosmetic results, and need for further surgery were discussed. Specifically, we also discussed loss of the Nipple areola if the graft does not survive. The patient had ample opportunity to have her questions answered to her satisfaction.  DESCRIPTION OF PROCEDURE  Patient was brought into the operating room and placed in a supine position.  SCDs were placed and appropriate padding was performed.  Antibiotics were given. The patient underwent general anesthesia and the chest was prepped and draped in a sterile fashion.  A timeout was performed and all information was confirmed to be correct.  Please note that the following procedure described below was performed bilaterally.  Preoperative markings were confirmed that included markings similar to the inferior pedicle technique.  Incision lines were injected with 1% Xylocaine with epinephrine.  After waiting for vasoconstriction, the marked lines were incised.  An amputation technique was utilized.  The 45 mm cookie cutter was utilized to mark the nipple  areola.  The #10 blade was used to remove the NAC as a free graft.  These were stored on the back table in saline soaked gauze.  The surrounding skin was de-epithelialized and the V portion of the markings where the NAC will be re-grafted onto the breast.  The #10 blade was used to incise the lines of the markings through the skin.  The bovie was used to dissect the lower portion of the breast in an amputation type technique.  Hemostasis was achieved with electrocautery.  The pocket was irrigated with antibiotic solution. Addition breast tissue was removed as able from the lateral portion for improved contour.  The limbs were brought down to the inframammary fold and secured in place with 3-0 Monocryl.  Additional 4-0 Monocryl was used to secure the location to the inframammary fold and then the vertical limb.  The NAC was then grafted back and secured with 4-0 Silk and 5-0 Monocryl.  The silk was used to secure a xeroform bolster over the graft. The limb was 7 -8 cm in length and had good position on the breast mound.  The patient was sat upright and size and shape symmetry was confirmed.  A drain was placed on each side and hemostasis confirmed.  The skin flaps had good capillary refill at the end of the procedure.  The patient tolerated the procedure well. The patient was allowed to wake from anesthesia and taken to the recovery room in satisfactory condition.

## 2013-12-28 DIAGNOSIS — Z6841 Body Mass Index (BMI) 40.0 and over, adult: Secondary | ICD-10-CM | POA: Diagnosis not present

## 2013-12-28 DIAGNOSIS — M546 Pain in thoracic spine: Secondary | ICD-10-CM | POA: Diagnosis not present

## 2013-12-28 DIAGNOSIS — Z9889 Other specified postprocedural states: Secondary | ICD-10-CM | POA: Diagnosis not present

## 2013-12-28 DIAGNOSIS — E039 Hypothyroidism, unspecified: Secondary | ICD-10-CM | POA: Diagnosis not present

## 2014-01-21 DIAGNOSIS — G5710 Meralgia paresthetica, unspecified lower limb: Secondary | ICD-10-CM | POA: Diagnosis not present

## 2014-01-21 DIAGNOSIS — Z9889 Other specified postprocedural states: Secondary | ICD-10-CM | POA: Diagnosis not present

## 2014-01-21 DIAGNOSIS — J45909 Unspecified asthma, uncomplicated: Secondary | ICD-10-CM | POA: Diagnosis not present

## 2014-01-21 DIAGNOSIS — Z6841 Body Mass Index (BMI) 40.0 and over, adult: Secondary | ICD-10-CM | POA: Diagnosis not present

## 2014-01-25 DIAGNOSIS — Z6841 Body Mass Index (BMI) 40.0 and over, adult: Secondary | ICD-10-CM | POA: Diagnosis not present

## 2014-01-25 DIAGNOSIS — G629 Polyneuropathy, unspecified: Secondary | ICD-10-CM | POA: Diagnosis not present

## 2014-02-15 ENCOUNTER — Telehealth: Payer: Self-pay | Admitting: Pulmonary Disease

## 2014-02-15 DIAGNOSIS — Z6841 Body Mass Index (BMI) 40.0 and over, adult: Secondary | ICD-10-CM | POA: Diagnosis not present

## 2014-02-15 DIAGNOSIS — G5710 Meralgia paresthetica, unspecified lower limb: Secondary | ICD-10-CM | POA: Diagnosis not present

## 2014-02-15 DIAGNOSIS — Z79899 Other long term (current) drug therapy: Secondary | ICD-10-CM | POA: Diagnosis not present

## 2014-02-15 DIAGNOSIS — R600 Localized edema: Secondary | ICD-10-CM | POA: Diagnosis not present

## 2014-02-15 DIAGNOSIS — G629 Polyneuropathy, unspecified: Secondary | ICD-10-CM | POA: Diagnosis not present

## 2014-02-15 NOTE — Telephone Encounter (Signed)
I spoke with the pt and she states she has been having a lot of issues getting cpap supplies through [facility]. She states she has called them several times and has been told she will get a call back but she has never heard anything from them. Pt is asking for a full face mask. She is also having issues with her machine.  I advised the pt that I will send a message to [facility] to see what is going on and we will call. [place] Bing, CMA

## 2014-02-18 NOTE — Telephone Encounter (Signed)
Melissa Rowe, CMA           [facility]. When the resupply team went to process this pt's order, they found that she is actually in collections for over $700. The patient is aware of her billing status and has been since last fall. We can't process any new orders until she resolves the issue with collections.     [id number].[place] Bing, CMA

## 2014-02-18 NOTE — Telephone Encounter (Signed)
I spoke with patient regarding message below: she was very understanding of the situation and she will contact [facility] and seek out payment arrangements. Then will see if she can get supplies she needs.

## 2014-02-26 ENCOUNTER — Encounter: Payer: Self-pay | Admitting: Neurology

## 2014-02-26 ENCOUNTER — Ambulatory Visit (INDEPENDENT_AMBULATORY_CARE_PROVIDER_SITE_OTHER): Payer: Medicare Other | Admitting: Neurology

## 2014-02-26 VITALS — BP 118/86 | HR 80 | Resp 18 | Wt 372.8 lb

## 2014-02-26 DIAGNOSIS — M51379 Other intervertebral disc degeneration, lumbosacral region without mention of lumbar back pain or lower extremity pain: Secondary | ICD-10-CM | POA: Insufficient documentation

## 2014-02-26 DIAGNOSIS — R208 Other disturbances of skin sensation: Secondary | ICD-10-CM | POA: Diagnosis not present

## 2014-02-26 DIAGNOSIS — M5137 Other intervertebral disc degeneration, lumbosacral region: Secondary | ICD-10-CM | POA: Insufficient documentation

## 2014-02-26 DIAGNOSIS — G5710 Meralgia paresthetica, unspecified lower limb: Secondary | ICD-10-CM | POA: Insufficient documentation

## 2014-02-26 DIAGNOSIS — G5711 Meralgia paresthetica, right lower limb: Secondary | ICD-10-CM | POA: Diagnosis not present

## 2014-02-26 MED ORDER — LIDOCAINE 5 % EX OINT: TOPICAL_OINTMENT | CUTANEOUS | Status: DC

## 2014-02-26 MED ORDER — GABAPENTIN 800 MG PO TABS: 800.0000 mg | ORAL_TABLET | Freq: Four times a day (QID) | ORAL | Status: DC

## 2014-02-26 NOTE — Progress Notes (Signed)
[facility]  PATIENT: Melissa Rowe DOB: 02/12/1966   REFERRING CLINICIAN: Dr. [name] HISTORY FROM: Patient  REASON FOR VISIT: Lateral thigh pain   HISTORICAL  CHIEF COMPLAINT:  Chief Complaint  Patient presents with  . Pain    Sts. she had breaset reduction surgery 12-16-13.  Since surgery she c/o "hot pins and needles pain" right thigh.  Positional numbness right leg. Sts. she had mri of back done at [place].  She has seen NS---Dr. Maryanna Shape was told pain is not coming from her back.  Sts. no relief with naproxen, Flexeril, Ultram, Oxycodone 5/325.  Some relieff with Lyrica 150mg  bid./fim    HISTORY OF PRESENT ILLNESS:  I had the pleasure to see your patient, Melissa Rowe, at [facility] for a neurologic consultation regarding her right anterolateral thigh pain. She had breast reduction surgery on November 4th and she reports that the right thigh was 'going to sleep' with numbness and tingling later that night.    Over the next week it got more painful and by December, the pain was very severe.  Pain was apparently so bad that she was screaming out in the middle of the night. She describes a gnawing pain that is constant but also has a stabbing pain frequently.   She was worked in at her Primary care provider's office, Dr. [name], and was diagnosed with right meralgia paresthetica.   Lyrica was started.  She saw neurosurgery and the Lyrica dose was increased from 75  to 150 mg twice a day with only minimal relief. She returned to her primary care provider's office on 02/16/2014 and tramadol was added.    Of note, she gained about 40 pounds during the 4 weeks between her two primary care visits.   On the higher dose of Lyrica, the stabbing pain was helped but the more annoying constant pain was not.  She is now on Lyrica 100 mg bid but it is less effective.       She has a constant moderate level pain and with a lot of allodynia.  Specifically, if her close rubs against the surface of the skin it will be painful. She gets intermittent more intense stabbing pain. The distribution is the right anterolateral thigh  From below the groin to the knee but not beyond.         REVIEW OF SYSTEMS:  Constitutional: No fevers, chills, sweats, or change in appetite Eyes: No visual changes, double vision, eye pain Ear, nose and throat: No hearing loss, ear pain, nasal congestion, sore throat Cardiovascular: No chest pain, palpitations Respiratory:  No shortness of breath at rest or with exertion.   No wheezes GastrointestinaI: No nausea, vomiting, diarrhea, abdominal pain, fecal incontinence Genitourinary:  No dysuria, urinary retention or frequency.  No nocturia. Musculoskeletal:  No neck pain, back pain Integumentary: No rash, pruritus, skin lesions Neurological: as above Psychiatric: No depression at this time.  No anxiety Endocrine: No palpitations, diaphoresis, change in appetite, change in weigh or increased thirst Hematologic/Lymphatic:  No anemia, purpura, petechiae. Allergic/Immunologic: No itchy/runny eyes, nasal congestion, recent allergic reactions, rashes  ALLERGIES: No Known Allergies  HOME MEDICATIONS: Outpatient Prescriptions Prior to Visit  Medication Sig Dispense Refill  . albuterol (PROVENTIL HFA;VENTOLIN HFA) 108 (90 BASE) MCG/ACT inhaler Inhale 2 puffs into the lungs every 6 (six) hours as needed for wheezing or shortness of breath. For shortness of breath.    . cyclobenzaprine (FLEXERIL) 10 MG tablet Take 1 tablet (10 mg  total) by mouth 2 (two) times daily as needed for muscle spasms. 20 tablet 0  . ferrous sulfate 325 (65 FE) MG tablet Take 325 mg by mouth daily with breakfast.    . levothyroxine (SYNTHROID, LEVOTHROID) 100 MCG tablet Take 100 mcg by mouth daily before breakfast.    . calcium carbonate (OS-CAL) 600 MG TABS Take 600 mg by mouth daily.     . calcium-vitamin D (OSCAL WITH D) 500-200  MG-UNIT per tablet Take 1 tablet by mouth.    . diazepam (VALIUM) 2 MG tablet Take 1 tablet (2 mg total) by mouth every 6 (six) hours as needed for muscle spasms. (Patient not taking: Reported on 02/26/2014) 30 tablet 0  . HYDROcodone-acetaminophen (NORCO/VICODIN) 5-325 MG per tablet Take 1-2 tablets by mouth every 4 (four) hours as needed for moderate pain. (Patient not taking: Reported on 02/26/2014) 30 tablet 0  . ibuprofen (ADVIL,MOTRIN) 800 MG tablet Take 1 tablet (800 mg total) by mouth 3 (three) times daily. (Patient not taking: Reported on 02/26/2014) 21 tablet 0  . levothyroxine (SYNTHROID, LEVOTHROID) 75 MCG tablet Take 75 mcg by mouth daily before breakfast.    . mometasone-formoterol (DULERA) 100-5 MCG/ACT AERO Inhale 2 puffs into the lungs 2 (two) times daily.    . montelukast (SINGULAIR) 10 MG tablet Take 10 mg by mouth daily with breakfast.    . montelukast (SINGULAIR) 10 MG tablet TAKE ONE TABLET BY MOUTH ONCE DAILY 30 tablet 0  . Multiple Vitamin (MULTIVITAMIN) capsule Take 1 capsule by mouth daily.     . naproxen (NAPROSYN) 500 MG tablet Take 500 mg by mouth 2 (two) times daily as needed for mild pain.    [name] Omeprazole-Sodium Bicarbonate (ZEGERID OTC PO) Take 1 tablet by mouth daily.    Earney Navy Bicarbonate (ZEGERID) 20-1100 MG CAPS capsule Take 1 capsule by mouth daily before breakfast.    . Phentermine-Topiramate (QSYMIA) 3.75-23 MG CP24 Take 1 tablet by mouth daily.    . polyethylene glycol (MIRALAX / GLYCOLAX) packet Take 17 g by mouth 2 (two) times daily.     . predniSONE (DELTASONE) 10 MG tablet Take 4 tabs po x 2 days, then 2 x 2 days, then 1 x 2, then 1/2 x 2 days then stop. 15 tablet 0  . spironolactone (ALDACTONE) 25 MG tablet Take 25 mg by mouth daily.    [name] tolterodine (DETROL LA) 2 MG 24 hr capsule Take 2 mg by mouth daily.    . traMADol (ULTRAM) 50 MG tablet Take 50 mg by mouth 3 (three) times daily as needed for moderate pain.    [name] TRAMADOL HCL PO Take 1  tablet by mouth.     No facility-administered medications prior to visit.    PAST MEDICAL HISTORY: Past Medical History  Diagnosis Date  . Dyspnea   . Gastroenteritis   . Hyperlipidemia   . Diverticulitis   . Atrial mass   . Hypothyroidism   . Asthma   . Urgency of urination   . Frequency of urination   . GERD (gastroesophageal reflux disease)   . Arthritis   . Environmental allergies   . Obesity   . OSA (obstructive sleep apnea)     wears CPAP night pt does not know settings  . Hemorrhoids     external with some bleeding  . Anemia sept /oct 2014  . Glaucoma   . Constipation   . PONV (postoperative nausea and vomiting)     states she gets sick on  her stomach after surgery  . Neuropathy     PAST SURGICAL HISTORY: Past Surgical History  Procedure Laterality Date  . Cholecystectomy  2008  . Polyps removal  2007  . Tubal ligation  1992  . Carpal tunnel release      right  . Colonoscopy    . Knee arthroscopy  03/18/2012    Procedure: ARTHROSCOPY KNEE;  Surgeon: Mcarthur Rossetti, MD;  Location: [place];  Service: Orthopedics;  Laterality: Right;  Right knee arthroscopy with debridement and three compartment chondroplasty  . Colonoscopy with propofol N/A 02/24/2013    Procedure: COLONOSCOPY WITH PROPOFOL;  Surgeon: Ladene Artist, MD;  Location: [facility];  Service: Endoscopy;  Laterality: N/A;  . Breast reduction surgery Bilateral 12/16/2013    Procedure: BILATERAL BREAST REDUCTION;  Surgeon: Theodoro Kos, DO;  Location: [place];  Service: Plastics;  Laterality: Bilateral;  . Carpal tunnel release Right   . Breast reduction surgery Bilateral   . Cholecystectomy    . Tubal ligaton      FAMILY HISTORY: Family History  Problem Relation Age of Onset  . Heart murmur Mother     rheumatoid arthritis  . Heart attack Mother   . COPD Mother   . Diabetes Mother   . Asthma Brother     and mother both as a child  . Prostate cancer Father   . Kidney failure Father      SOCIAL HISTORY:  History   Social History  . Marital Status: Married    Spouse Name: N/A    Number of Children: 2  . Years of Education: N/A   Occupational History  . 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     Social History Main Topics  . Smoking status: Former Smoker -- 2.00 packs/day for 7 years    Types: Cigarettes    Quit date: 02/12/1990  . Smokeless tobacco: Never Used  . Alcohol Use: Yes     Comment: rare  . Drug Use: No  . Sexual Activity: Not on file   Other Topics Concern  . Not on file   Social History Narrative     PHYSICAL EXAM  Filed Vitals:   02/26/14 1038  BP: 118/86  Pulse: 80  Resp: 18  Weight: 372 lb 12.8 oz (169.101 kg)    Body mass index is 60.2 kg/(m^2).   General: The patient is well-developed and well-nourished and in no acute distress  Eyes:  Funduscopic exam shows normal optic discs and retinal vessels.  Neck: The neck is supple, no carotid bruits are noted.  The neck is nontender.  Respiratory: The respiratory examination is clear.  Cardiovascular: The cardiovascular examination reveals a regular rate and rhythm, no murmurs, gallops or rubs are noted.  Skin: Extremities are without significant edema.  Neurologic Exam  Mental status: The patient is alert and oriented x 3 at the time of the examination. The patient has apparent normal recent and remote memory, with an apparently normal attention span and concentration ability.   Speech is normal.  Cranial nerves: Extraocular movements are full. Pupils are equal, round, and reactive to light and accomodation.  Visual fields are full.  Facial symmetry is present. There is good facial sensation to soft touch bilaterally.Facial strength is normal.  Trapezius and sternocleidomastoid strength is normal. No dysarthria is noted.  The tongue is midline, and the patient has symmetric elevation of the soft palate. No obvious hearing deficits are noted.  Motor:  Muscle  bulk and tone are normal. Strength is  5 / 5 in all 4 extremities.   Sensory: Sensory testing is intact to pinprick, soft touch, vibration sensation, and position sense on all 4 extremities.  Coordination: Cerebellar testing reveals good finger-nose-finger and heel-to-shin bilaterally.  Gait and station: Station and gait are normal. Tandem gait is normal. Romberg is negative.   Reflexes: Deep tendon reflexes are symmetric and normal bilaterally. Plantar responses are normal.    DIAGNOSTIC DATA (LABS, IMAGING, TESTING) - I reviewed patient records, labs, notes, testing and imaging myself where available.  Lab Results  Component Value Date   WBC 16.2* 12/16/2013   HGB 10.3* 12/16/2013   HCT 31.2* 12/16/2013   MCV 63.3* 12/16/2013   PLT 357 12/16/2013      Component Value Date/Time   NA 137 12/16/2013 1730   K 4.6 12/16/2013 1730   CL 99 12/16/2013 1730   CO2 24 12/16/2013 1730   GLUCOSE 181* 12/16/2013 1730   BUN 10 12/16/2013 1730   CREATININE 0.74 12/16/2013 1730   CALCIUM 9.0 12/16/2013 1730   PROT 8.0 12/16/2013 0910   ALBUMIN 3.7 12/16/2013 0910   AST 13 12/16/2013 0910   ALT 13 12/16/2013 0910   ALKPHOS 75 12/16/2013 0910   BILITOT 0.2* 12/16/2013 0910   GFRNONAA >90 12/16/2013 1730   GFRAA >90 12/16/2013 1730   Lab Results  Component Value Date   CHOL 209* 09/22/2009   HDL 62 09/22/2009   LDLCALC 116* 09/22/2009   TRIG 156* 09/22/2009   CHOLHDL 3.4 Ratio 09/22/2009   Lab Results  Component Value Date   HGBA1C 5.7* 12/16/2013   No results found for: VITAMINB12 Lab Results  Component Value Date   TSH 6.759* 01/11/2009    CLINICAL DATA: Chronic low back pain for 7 years  EXAM: MRI LUMBAR SPINE WITHOUT CONTRAST  TECHNIQUE: Multiplanar, multisequence MR imaging was performed. No intravenous contrast was administered.  COMPARISON: 12/10/2006.  FINDINGS: There is a mild levoconvex curve of the lumbar spine with the apex at L3. The spinal  cord terminates at the L1 level. The paraspinal soft tissues are within normal limits. Marrow signal is normal.  T11-T12 and T12-L1 appear normal.  L1-L2: Negative.  L2-L3: Mild disc desiccation with shallow broad-based central protrusion but no resulting stenosis.  L3-L4: Disc desiccation and mild loss of height. Right foraminal protrusion is present with associated annular tear. This produces very mild right foraminal stenosis with the protrusion contacting exiting right L3 nerve in the foramen. The left neural foramen and central canal are patent. Disc protrusion just contacts the descending right L4 nerve as it approaches the lateral recess.  L4-L5: Disc desiccation. Central annular tear with right paracentral protrusion. Very mild central stenosis. Mild encroachment on the right lateral recess and descending right L5 nerve. The neural foramina appear adequately patent.  L5-S1: Degenerated disc with loss of height. Vacuum disc. Central canal patent. Foramina appear adequately patent. Right paracentral broad-based protrusion is present mildly encroaching on the right S1 nerve in the lateral recess.  IMPRESSION: 1. L3-L4 right foraminal annular tear and protrusion with mild right foraminal stenosis potentially affecting the right L3 nerve. 2. L4-L5 annular tear and right paracentral protrusion with mild central stenosis and mild encroachment on the descending right L5 nerve. 3. L5-S1 disc degeneration with broad-based right paracentral protrusion minimally encroaching on the right lateral recess.   Electronically Signed  By: Dereck Ligas M.D.  On: 06/04/2013 08:03  ASSESSMENT AND PLAN  Meralgia paraesthetica, right  Allodynia  Obesity, morbid  Degeneration of lumbar or lumbosacral intervertebral disc  In summary, Mrs. Gienger is a 49 year old woman who reports right thigh tingling that evolved into severe pain. The tingling started shortly  after she had a surgical procedure patient her exam and the symptoms, she likely has a right meralgia paresthetica due to compression of the right lateral femoral cutaneous nerve. Risk factors for her would include morbid obesity, weight swings and possible compression due to positioning for surgery.   Complicating the diagnosis, she also has lumbar disc degeneration and there is some encroachment on the right L4 nerve root which covers a similar territory of the thigh., There does not appear to be any motor component to her situation. She received some benefit from Lyrica but gained 40 pounds. Low doses helping her enough. The Lyrica and she will go on gabapentin 800 mg by mouth 4 times a day. These medicines work similarly the gabapentin is less likely to cause weight gain. Hopefully she will be able to tolerate this much better.  If not, I will switch her to lamotrigine. I also added lidocaine ointment as it may give her more rapid onset of benefit. I discussed that the doses is difficult to predict. Many people get a near complete recovery but sometimes mild allodynia remains. I think it is unlikely that she will continue to have severe pain from the nerve injury. If she does not get better or if she worsens I would consider obtaining an EMG and nerve conduction study. Due to her large size, we will not be able to determine the function of the lateral femoral cutaneous nerve and this test would be to rule out a superimposed subacute radiculopathy  She will return to see [place] in about 4 weeks. She is advised to call us if she has any new or worsening neurologic symptoms.  Thank you very much for asking me to see Mrs. Kunesh for neurologic consultation. Please let me know if I can be of further assistance with her or other patients the future.   Draeden Kellman A. Felecia Shelling, MD, PhD 08/08/9483, 46:27 AM Certified in Neurology, Clinical Neurophysiology, Sleep Medicine, Pain Medicine and Neuroimaging  [facility] [address], [place], [place] [address] [phone]

## 2014-03-02 DIAGNOSIS — G609 Hereditary and idiopathic neuropathy, unspecified: Secondary | ICD-10-CM | POA: Diagnosis not present

## 2014-03-08 DIAGNOSIS — M170 Bilateral primary osteoarthritis of knee: Secondary | ICD-10-CM | POA: Diagnosis not present

## 2014-03-26 ENCOUNTER — Encounter: Payer: Self-pay | Admitting: Neurology

## 2014-03-26 ENCOUNTER — Other Ambulatory Visit: Payer: Self-pay | Admitting: *Deleted

## 2014-03-26 ENCOUNTER — Ambulatory Visit (INDEPENDENT_AMBULATORY_CARE_PROVIDER_SITE_OTHER): Payer: Medicare Other | Admitting: Neurology

## 2014-03-26 VITALS — BP 108/74 | HR 88 | Resp 16 | Ht 66.0 in | Wt 367.0 lb

## 2014-03-26 DIAGNOSIS — G5711 Meralgia paresthetica, right lower limb: Secondary | ICD-10-CM | POA: Diagnosis not present

## 2014-03-26 DIAGNOSIS — R208 Other disturbances of skin sensation: Secondary | ICD-10-CM

## 2014-03-26 MED ORDER — LAMOTRIGINE 100 MG PO TABS: 100.0000 mg | ORAL_TABLET | Freq: Every day | ORAL | Status: DC

## 2014-03-26 MED ORDER — LAMOTRIGINE 25 MG PO TABS: ORAL_TABLET | ORAL | Status: DC

## 2014-03-26 NOTE — Progress Notes (Signed)
[facility]  PATIENT: Melissa Rowe DOB: 1966-02-05   REFERRING CLINICIAN: Dr. [name] HISTORY FROM: Patient  REASON FOR VISIT: Lateral thigh pain   HISTORICAL  CHIEF COMPLAINT:  Chief Complaint  Patient presents with  . meralgia paresthetica    Sts. pain right leg is much improved since stopping Lyrica and starting Gabapentin 800mg  qid.  She still has one area lateral and slightly posterior right upper thigh that is tender.  Fluid retention has also decreased since stopping Lyrica./fim    HISTORY OF PRESENT ILLNESS:   Melissa Rowe returns to  [facility] for a neurologic follow-up visit regarding her right anterolateral thigh pain.  Currently, she is doing better but still having a moderate pain in the anterolateral thigh.    Pain is burning and she notes allodynia -- touch and water running over leg is painful.   She tolerates gabapentin much better than Lyrica.   Lidocaine ointment also seems to helping some.      She could not tolerate Lyrica (weight gain).  She is able to sleep much better and no longer feels agitated from the pain.    02/26/14:   She had breast reduction surgery on November 4th and she reports that the right thigh was 'going to sleep' with numbness and tingling later that night.    Over the next week it got more painful and by December, the pain was very severe.  Pain was apparently so bad that she was screaming out in the middle of the night. She describes a gnawing pain that is constant but also has a stabbing pain frequently.   She was worked in at her Primary care provider's office, Dr. [name], and was diagnosed with right meralgia paresthetica.   Lyrica was started.  She saw neurosurgery and the Lyrica dose was increased from 75  to 150 mg twice a day with only minimal relief. She returned to her primary care provider's office on 02/16/2014 and tramadol was added.    Of note, she gained about 40 pounds during the 4 weeks  between her two primary care visits.   On the higher dose of Lyrica, the stabbing pain was helped but the more annoying constant pain was not.  She is now on Lyrica 100 mg bid but it is less effective.     She has a constant moderate level pain and with a lot of allodynia. Specifically, if her close rubs against the surface of the skin it will be painful. She gets intermittent more intense stabbing pain. The distribution is the right anterolateral thigh  From below the groin to the knee but not beyond.         REVIEW OF SYSTEMS:  Constitutional: No fevers, chills, sweats, or change in appetite Eyes: No visual changes, double vision, eye pain Ear, nose and throat: No hearing loss, ear pain, nasal congestion, sore throat Cardiovascular: No chest pain, palpitations Respiratory:  No shortness of breath at rest or with exertion.   No wheezes GastrointestinaI: No nausea, vomiting, diarrhea, abdominal pain, fecal incontinence Genitourinary:  No dysuria, urinary retention or frequency.  No nocturia. Musculoskeletal:  No neck pain, some back pain, moderate knee pain Integumentary: No rash, pruritus, skin lesions Neurological: as above Psychiatric: No depression at this time.  No anxiety Endocrine: No palpitations, diaphoresis, change in appetite, change in weigh or increased thirst Hematologic/Lymphatic:  No anemia, purpura, petechiae. Allergic/Immunologic: No itchy/runny eyes, nasal congestion, recent allergic reactions, rashes  ALLERGIES: No Known Allergies  HOME MEDICATIONS: Outpatient Prescriptions Prior to Visit  Medication Sig Dispense Refill  . albuterol (PROVENTIL HFA;VENTOLIN HFA) 108 (90 BASE) MCG/ACT inhaler Inhale 2 puffs into the lungs every 6 (six) hours as needed for wheezing or shortness of breath. For shortness of breath.    [name] albuterol (PROVENTIL HFA;VENTOLIN HFA) 108 (90 BASE) MCG/ACT inhaler Inhale 2 puffs into the lungs.    . calcium-vitamin D (OSCAL WITH D) 500-200 MG-UNIT  per tablet Take 1 tablet by mouth.    . calcium-vitamin D (OSCAL WITH D) 500-200 MG-UNIT per tablet Take 1 tablet by mouth.    . cyclobenzaprine (FLEXERIL) 10 MG tablet Take 1 tablet (10 mg total) by mouth 2 (two) times daily as needed for muscle spasms. 20 tablet 0  . cyclobenzaprine (FLEXERIL) 10 MG tablet Take 10 mg by mouth.    . diazepam (VALIUM) 2 MG tablet Take 1 tablet (2 mg total) by mouth every 6 (six) hours as needed for muscle spasms. (Patient not taking: Reported on 02/26/2014) 30 tablet 0  . docusate sodium (COLACE) 250 MG capsule Take 250 mg by mouth.    . ferrous sulfate 325 (65 FE) MG tablet Take 325 mg by mouth daily with breakfast.    . furosemide (LASIX) 40 MG tablet     . gabapentin (NEURONTIN) 800 MG tablet Take 1 tablet (800 mg total) by mouth 4 (four) times daily. 120 tablet 5  . HYDROcodone-acetaminophen (NORCO/VICODIN) 5-325 MG per tablet Take 1-2 tablets by mouth every 4 (four) hours as needed for moderate pain. (Patient not taking: Reported on 02/26/2014) 30 tablet 0  . ibuprofen (ADVIL,MOTRIN) 800 MG tablet Take 1 tablet (800 mg total) by mouth 3 (three) times daily. (Patient not taking: Reported on 02/26/2014) 21 tablet 0  . levothyroxine (SYNTHROID, LEVOTHROID) 100 MCG tablet Take 100 mcg by mouth daily before breakfast.    . levothyroxine (SYNTHROID, LEVOTHROID) 100 MCG tablet Take 100 mcg by mouth.    . levothyroxine (SYNTHROID, LEVOTHROID) 75 MCG tablet Take 75 mcg by mouth daily before breakfast.    . lidocaine (XYLOCAINE) 5 % ointment Dispense 2 50 g tubes  Apply to right thigh twice a day 100 g 3  . mometasone-formoterol (DULERA) 100-5 MCG/ACT AERO Inhale 2 puffs into the lungs 2 (two) times daily.    . mometasone-formoterol (DULERA) 200-5 MCG/ACT AERO Inhale 2 puffs into the lungs.    . montelukast (SINGULAIR) 10 MG tablet Take 10 mg by mouth daily with breakfast.    . montelukast (SINGULAIR) 10 MG tablet TAKE ONE TABLET BY MOUTH ONCE DAILY 30 tablet 0  .  montelukast (SINGULAIR) 10 MG tablet Take 10 mg by mouth.    . Multiple Vitamin (MULTIVITAMIN) capsule Take 1 capsule by mouth daily.     . Multiple Vitamins tablet Take 1 tablet by mouth.    . naproxen (NAPROSYN) 500 MG tablet Take 500 mg by mouth 2 (two) times daily as needed for mild pain.    [name] Omeprazole-Sodium Bicarbonate (ZEGERID OTC PO) Take 1 tablet by mouth daily.    Earney Navy Bicarbonate (ZEGERID) 20-1100 MG CAPS capsule Take 1 capsule by mouth daily before breakfast.    . Omeprazole-Sodium Bicarbonate (ZEGERID) 20-1100 MG CAPS capsule Take by mouth.    . phentermine 15 MG capsule Take 15 mg by mouth.    . Phentermine-Topiramate (QSYMIA) 3.75-23 MG CP24 Take 1 tablet by mouth daily.    . polyethylene glycol (MIRALAX / GLYCOLAX) packet Take 17 g by mouth 2 (two)  times daily.     . predniSONE (DELTASONE) 10 MG tablet Take 4 tabs po x 2 days, then 2 x 2 days, then 1 x 2, then 1/2 x 2 days then stop. 15 tablet 0  . spironolactone (ALDACTONE) 25 MG tablet Take 25 mg by mouth daily.    [name] spironolactone (ALDACTONE) 25 MG tablet Take 25 mg by mouth.    . tolterodine (DETROL LA) 2 MG 24 hr capsule Take 2 mg by mouth daily.    . traMADol (ULTRAM) 50 MG tablet Take 50 mg by mouth 3 (three) times daily as needed for moderate pain.    . traMADol (ULTRAM) 50 MG tablet Take 50 mg by mouth.    . TRAMADOL HCL PO Take 1 tablet by mouth.    . calcium carbonate (OS-CAL) 600 MG TABS Take 600 mg by mouth daily.      No facility-administered medications prior to visit.    PAST MEDICAL HISTORY: Past Medical History  Diagnosis Date  . Dyspnea   . Gastroenteritis   . Hyperlipidemia   . Diverticulitis   . Atrial mass   . Hypothyroidism   . Asthma   . Urgency of urination   . Frequency of urination   . GERD (gastroesophageal reflux disease)   . Arthritis   . Environmental allergies   . Obesity   . OSA (obstructive sleep apnea)     wears CPAP night pt does not know settings  .  Hemorrhoids     external with some bleeding  . Anemia sept /oct 2014  . Glaucoma   . Constipation   . PONV (postoperative nausea and vomiting)     states she gets sick on her stomach after surgery  . Neuropathy     PAST SURGICAL HISTORY: Past Surgical History  Procedure Laterality Date  . Cholecystectomy  2008  . Polyps removal  2007  . Tubal ligation  1992  . Carpal tunnel release      right  . Colonoscopy    . Knee arthroscopy  03/18/2012    Procedure: ARTHROSCOPY KNEE;  Surgeon: Mcarthur Rossetti, MD;  Location: [place];  Service: Orthopedics;  Laterality: Right;  Right knee arthroscopy with debridement and three compartment chondroplasty  . Colonoscopy with propofol N/A 02/24/2013    Procedure: COLONOSCOPY WITH PROPOFOL;  Surgeon: Ladene Artist, MD;  Location: [facility];  Service: Endoscopy;  Laterality: N/A;  . Breast reduction surgery Bilateral 12/16/2013    Procedure: BILATERAL BREAST REDUCTION;  Surgeon: Theodoro Kos, DO;  Location: [place];  Service: Plastics;  Laterality: Bilateral;  . Carpal tunnel release Right   . Breast reduction surgery Bilateral   . Cholecystectomy    . Tubal ligaton      FAMILY HISTORY: Family History  Problem Relation Age of Onset  . Heart murmur Mother     rheumatoid arthritis  . Heart attack Mother   . COPD Mother   . Diabetes Mother   . Asthma Brother     and mother both as a child  . Prostate cancer Father   . Kidney failure Father     SOCIAL HISTORY:  History   Social History  . Marital Status: Married    Spouse Name: N/A  . Number of Children: 2  . Years of Education: N/A   Occupational History  . 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     Social History Main Topics  . Smoking status: Former  Smoker -- 2.00 packs/day for 7 years    Types: Cigarettes    Quit date: 02/12/1990  . Smokeless tobacco: Never Used  . Alcohol Use: Yes     Comment: rare  . Drug Use: No  . Sexual Activity: Not on file     Other Topics Concern  . Not on file   Social History Narrative     PHYSICAL EXAM  Filed Vitals:   03/26/14 0923  BP: 108/74  Pulse: 88  Resp: 16  Height: 5\' 6"  (1.676 m)  Weight: 367 lb (166.47 kg)    Body mass index is 59.26 kg/(m^2).   General: The patient is well-developed and well-nourished and in no acute distress.   Much less agitated than last visit  Skin: Extremities are without significant edema.  Neurologic Exam  Mental status: The patient is alert and oriented x 3 at the time of the examination. The patient has apparent normal recent and remote memory, with an apparently normal attention span and concentration ability.   Speech is normal.  Cranial nerves: Extraocular movements are full. Pupils are equal, round, and reactive to light and accomodation.  Visual fields are full.  Facial symmetry is present. There is good facial sensation to soft touch bilaterally.Facial strength is normal.  Trapezius and sternocleidomastoid strength is normal. No dysarthria is noted.  The tongue is midline, and the patient has symmetric elevation of the soft palate. No obvious hearing deficits are noted.  Motor:  Muscle bulk and tone are normal. Strength is  5 / 5 in all 4 extremities.   Sensory: Sensory testing is intact to pinprick, soft touch, vibration sensation, and position sense on all 4 extremities except for allodynia over the right anterolateral thigh  Coordination: Cerebellar testing reveals good finger-nose-finger and ok heel-to-shin bilaterally.  Gait and station: Station is normal and gait is arthritic.. Romberg is negative.   Reflexes: Deep tendon reflexes are symmetric and normal bilaterally.     DIAGNOSTIC DATA (LABS, IMAGING, TESTING) - I reviewed patient records, labs, notes, testing and imaging myself where available.  Lab Results  Component Value Date   WBC 16.2* 12/16/2013   HGB 10.3* 12/16/2013   HCT 31.2* 12/16/2013   MCV 63.3* 12/16/2013   PLT  357 12/16/2013      Component Value Date/Time   NA 137 12/16/2013 1730   K 4.6 12/16/2013 1730   CL 99 12/16/2013 1730   CO2 24 12/16/2013 1730   GLUCOSE 181* 12/16/2013 1730   BUN 10 12/16/2013 1730   CREATININE 0.74 12/16/2013 1730   CALCIUM 9.0 12/16/2013 1730   PROT 8.0 12/16/2013 0910   ALBUMIN 3.7 12/16/2013 0910   AST 13 12/16/2013 0910   ALT 13 12/16/2013 0910   ALKPHOS 75 12/16/2013 0910   BILITOT 0.2* 12/16/2013 0910   GFRNONAA >90 12/16/2013 1730   GFRAA >90 12/16/2013 1730   Lab Results  Component Value Date   CHOL 209* 09/22/2009   HDL 62 09/22/2009   LDLCALC 116* 09/22/2009   TRIG 156* 09/22/2009   CHOLHDL 3.4 Ratio 09/22/2009   Lab Results  Component Value Date   HGBA1C 5.7* 12/16/2013   No results found for: VITAMINB12 Lab Results  Component Value Date   TSH 6.759* 01/11/2009    CLINICAL DATA: Chronic low back pain for 7 years  EXAM: MRI LUMBAR SPINE WITHOUT CONTRAST  TECHNIQUE: Multiplanar, multisequence MR imaging was performed. No intravenous contrast was administered.  COMPARISON: 12/10/2006.  FINDINGS: There is a mild levoconvex curve  of the lumbar spine with the apex at L3. The spinal cord terminates at the L1 level. The paraspinal soft tissues are within normal limits. Marrow signal is normal.  T11-T12 and T12-L1 appear normal.  L1-L2: Negative.  L2-L3: Mild disc desiccation with shallow broad-based central protrusion but no resulting stenosis.  L3-L4: Disc desiccation and mild loss of height. Right foraminal protrusion is present with associated annular tear. This produces very mild right foraminal stenosis with the protrusion contacting exiting right L3 nerve in the foramen. The left neural foramen and central canal are patent. Disc protrusion just contacts the descending right L4 nerve as it approaches the lateral recess.  L4-L5: Disc desiccation. Central annular tear with right paracentral protrusion. Very  mild central stenosis. Mild encroachment on the right lateral recess and descending right L5 nerve. The neural foramina appear adequately patent.  L5-S1: Degenerated disc with loss of height. Vacuum disc. Central canal patent. Foramina appear adequately patent. Right paracentral broad-based protrusion is present mildly encroaching on the right S1 nerve in the lateral recess.  IMPRESSION: 1. L3-L4 right foraminal annular tear and protrusion with mild right foraminal stenosis potentially affecting the right L3 nerve. 2. L4-L5 annular tear and right paracentral protrusion with mild central stenosis and mild encroachment on the descending right L5 nerve. 3. L5-S1 disc degeneration with broad-based right paracentral protrusion minimally encroaching on the right lateral recess.   Electronically Signed  By: Dereck Ligas M.D.  On: 06/04/2013 08:03     ASSESSMENT AND PLAN  Meralgia paraesthetica, right  Morbid obesity  Allodynia   Melissa Rowe is doing much better on the high-dose gabapentin and tolerates it well. The Lidoderm ointment is also helping some. She still has moderate allodynia over the lateral femoral cutaneous nerve neurotome and I will add lamotrigine and titrate up to 100 mg by mouth twice a day. She will return to see me in 3 months or sooner if she has new or worsening neurologic symptoms.   Melissa Rowe A. Felecia Shelling, MD, PhD 2/56/3893, 7:34 AM Certified in Neurology, Clinical Neurophysiology, Sleep Medicine, Pain Medicine and Neuroimaging  [facility] [address] [place], [place] [address] [phone]

## 2014-03-30 DIAGNOSIS — Z6841 Body Mass Index (BMI) 40.0 and over, adult: Secondary | ICD-10-CM | POA: Diagnosis not present

## 2014-03-30 DIAGNOSIS — G609 Hereditary and idiopathic neuropathy, unspecified: Secondary | ICD-10-CM | POA: Diagnosis not present

## 2014-03-30 DIAGNOSIS — G5710 Meralgia paresthetica, unspecified lower limb: Secondary | ICD-10-CM | POA: Diagnosis not present

## 2014-03-30 DIAGNOSIS — R7301 Impaired fasting glucose: Secondary | ICD-10-CM | POA: Diagnosis not present

## 2014-03-30 DIAGNOSIS — D509 Iron deficiency anemia, unspecified: Secondary | ICD-10-CM | POA: Diagnosis not present

## 2014-03-30 DIAGNOSIS — G4733 Obstructive sleep apnea (adult) (pediatric): Secondary | ICD-10-CM | POA: Diagnosis not present

## 2014-04-30 DIAGNOSIS — L816 Other disorders of diminished melanin formation: Secondary | ICD-10-CM | POA: Diagnosis not present

## 2014-05-17 ENCOUNTER — Encounter: Payer: Self-pay | Admitting: Neurology

## 2014-05-25 DIAGNOSIS — L819 Disorder of pigmentation, unspecified: Secondary | ICD-10-CM | POA: Diagnosis not present

## 2014-06-07 DIAGNOSIS — M1711 Unilateral primary osteoarthritis, right knee: Secondary | ICD-10-CM | POA: Diagnosis not present

## 2014-06-07 DIAGNOSIS — M1712 Unilateral primary osteoarthritis, left knee: Secondary | ICD-10-CM | POA: Diagnosis not present

## 2014-06-18 ENCOUNTER — Telehealth: Payer: Self-pay

## 2014-06-18 NOTE — Telephone Encounter (Signed)
Pt contacted to reschedule appointment, voicemail was left informing pt to call back, when rescheduling please schedule with Dr. Felecia Shelling in a 20 min slot thanks

## 2014-06-21 DIAGNOSIS — R1030 Lower abdominal pain, unspecified: Secondary | ICD-10-CM | POA: Diagnosis not present

## 2014-06-21 DIAGNOSIS — R933 Abnormal findings on diagnostic imaging of other parts of digestive tract: Secondary | ICD-10-CM | POA: Diagnosis not present

## 2014-06-21 DIAGNOSIS — R112 Nausea with vomiting, unspecified: Secondary | ICD-10-CM | POA: Diagnosis not present

## 2014-06-21 DIAGNOSIS — K5730 Diverticulosis of large intestine without perforation or abscess without bleeding: Secondary | ICD-10-CM | POA: Diagnosis not present

## 2014-06-21 DIAGNOSIS — R197 Diarrhea, unspecified: Secondary | ICD-10-CM | POA: Diagnosis not present

## 2014-06-21 DIAGNOSIS — Z6841 Body Mass Index (BMI) 40.0 and over, adult: Secondary | ICD-10-CM | POA: Diagnosis not present

## 2014-06-21 DIAGNOSIS — M170 Bilateral primary osteoarthritis of knee: Secondary | ICD-10-CM | POA: Diagnosis not present

## 2014-06-21 DIAGNOSIS — N200 Calculus of kidney: Secondary | ICD-10-CM | POA: Diagnosis not present

## 2014-06-21 DIAGNOSIS — D509 Iron deficiency anemia, unspecified: Secondary | ICD-10-CM | POA: Diagnosis not present

## 2014-06-21 DIAGNOSIS — E039 Hypothyroidism, unspecified: Secondary | ICD-10-CM | POA: Diagnosis not present

## 2014-06-25 ENCOUNTER — Ambulatory Visit: Payer: Medicare Other | Admitting: Neurology

## 2014-07-16 ENCOUNTER — Ambulatory Visit: Payer: Medicare Other | Admitting: Neurology

## 2014-07-19 ENCOUNTER — Telehealth: Payer: Self-pay | Admitting: Pulmonary Disease

## 2014-07-19 DIAGNOSIS — G4733 Obstructive sleep apnea (adult) (pediatric): Secondary | ICD-10-CM

## 2014-07-19 NOTE — Telephone Encounter (Signed)
Spoke with patient - states that she is having issues with CPAP machine and is having to pay a lot of money out of pocket. Patient has been denied renewal of supplies until her bills are paid in FULL. Pt states that she owes $721.40 in debt to [facility] and was advised that her account has been sent to collections. Pt states that when she first set up CPAP with [facility] that she was advised that she would not have any out of pocket costs (with [place]) and that CPAP was covered. Pt was also advised that she would be set up with an Assistance Program for CPAP machine (which she never heard anything back about) to help with coverage as well. Pt states that apparently her insurance never covered her CPAP and she was never made aware of this. She has been building up a debt since being started on CPAP around 08/2013. Patient states that when she called to get a renewal for her CPAP mask-because her current mask is broken-she was informed of this balance and that until paid she has been denied any new supplies.  Pt wanting to know if being switched to another DME could be an option for her to get supplies? Patient is not able to use machine because mask is broken - has been taped up for about a year and is no longer able to use it. Pt last seen 02/28/2012 by VS   Please advise [facility] on the patient's next options. Thanks.

## 2014-07-21 ENCOUNTER — Encounter: Payer: Self-pay | Admitting: Neurology

## 2014-07-21 NOTE — Telephone Encounter (Signed)
Please set her up for CPAP mask desensitization at sleep lab >> she should be able to keep her mask from there.

## 2014-07-21 NOTE — Telephone Encounter (Signed)
Pt returned call [phone]

## 2014-07-21 NOTE — Telephone Encounter (Signed)
pcc's please advise.  Thanks!! 

## 2014-07-21 NOTE — Telephone Encounter (Signed)
lmtcb for pt.  

## 2014-07-21 NOTE — Telephone Encounter (Signed)
Patient notified that she cannot have patient assistance since she has insurance.  She wants to know what are her choices to help with Sleep Apnea since she cannot afford the CPAP.  VS - Please advise.

## 2014-07-21 NOTE — Telephone Encounter (Signed)
If this pt has insurance of any kind she can not get help with cpap assistance program this is for uninsured only [place]

## 2014-07-22 NOTE — Telephone Encounter (Signed)
Pt is aware of VS recommendation. Order has been placed. Nothing further was needed.

## 2014-08-03 ENCOUNTER — Ambulatory Visit (HOSPITAL_BASED_OUTPATIENT_CLINIC_OR_DEPARTMENT_OTHER): Payer: Medicare Other | Attending: Pulmonary Disease | Admitting: Radiology

## 2014-08-03 DIAGNOSIS — G4733 Obstructive sleep apnea (adult) (pediatric): Secondary | ICD-10-CM

## 2014-08-03 DIAGNOSIS — Z9989 Dependence on other enabling machines and devices: Principal | ICD-10-CM

## 2014-08-17 ENCOUNTER — Ambulatory Visit (HOSPITAL_BASED_OUTPATIENT_CLINIC_OR_DEPARTMENT_OTHER): Payer: Medicare Other

## 2014-08-30 DIAGNOSIS — R350 Frequency of micturition: Secondary | ICD-10-CM | POA: Diagnosis not present

## 2014-08-30 DIAGNOSIS — M545 Low back pain: Secondary | ICD-10-CM | POA: Diagnosis not present

## 2014-08-30 DIAGNOSIS — K760 Fatty (change of) liver, not elsewhere classified: Secondary | ICD-10-CM | POA: Diagnosis not present

## 2014-08-30 DIAGNOSIS — K5730 Diverticulosis of large intestine without perforation or abscess without bleeding: Secondary | ICD-10-CM | POA: Diagnosis not present

## 2014-08-30 DIAGNOSIS — N200 Calculus of kidney: Secondary | ICD-10-CM | POA: Diagnosis not present

## 2014-08-30 DIAGNOSIS — R319 Hematuria, unspecified: Secondary | ICD-10-CM | POA: Diagnosis not present

## 2014-08-30 DIAGNOSIS — R109 Unspecified abdominal pain: Secondary | ICD-10-CM | POA: Diagnosis not present

## 2014-08-30 DIAGNOSIS — N390 Urinary tract infection, site not specified: Secondary | ICD-10-CM | POA: Diagnosis not present

## 2014-08-30 DIAGNOSIS — E079 Disorder of thyroid, unspecified: Secondary | ICD-10-CM | POA: Diagnosis not present

## 2014-08-30 DIAGNOSIS — Z791 Long term (current) use of non-steroidal anti-inflammatories (NSAID): Secondary | ICD-10-CM | POA: Diagnosis not present

## 2014-08-30 DIAGNOSIS — Z9851 Tubal ligation status: Secondary | ICD-10-CM | POA: Diagnosis not present

## 2014-09-01 DIAGNOSIS — G5791 Unspecified mononeuropathy of right lower limb: Secondary | ICD-10-CM | POA: Diagnosis not present

## 2014-09-01 DIAGNOSIS — N200 Calculus of kidney: Secondary | ICD-10-CM | POA: Diagnosis not present

## 2014-09-01 DIAGNOSIS — R312 Other microscopic hematuria: Secondary | ICD-10-CM | POA: Diagnosis not present

## 2014-09-03 DIAGNOSIS — Z6841 Body Mass Index (BMI) 40.0 and over, adult: Secondary | ICD-10-CM | POA: Diagnosis not present

## 2014-09-03 DIAGNOSIS — N200 Calculus of kidney: Secondary | ICD-10-CM | POA: Diagnosis not present

## 2014-09-03 DIAGNOSIS — N390 Urinary tract infection, site not specified: Secondary | ICD-10-CM | POA: Diagnosis not present

## 2014-09-28 DIAGNOSIS — M1711 Unilateral primary osteoarthritis, right knee: Secondary | ICD-10-CM | POA: Diagnosis not present

## 2014-09-28 DIAGNOSIS — M1712 Unilateral primary osteoarthritis, left knee: Secondary | ICD-10-CM | POA: Diagnosis not present

## 2014-10-08 ENCOUNTER — Telehealth: Payer: Self-pay | Admitting: Neurology

## 2014-10-08 ENCOUNTER — Ambulatory Visit (INDEPENDENT_AMBULATORY_CARE_PROVIDER_SITE_OTHER): Payer: Medicare Other | Admitting: Neurology

## 2014-10-08 ENCOUNTER — Encounter: Payer: Self-pay | Admitting: Neurology

## 2014-10-08 VITALS — BP 126/88 | HR 78 | Resp 18 | Ht 66.0 in | Wt 348.6 lb

## 2014-10-08 DIAGNOSIS — G5711 Meralgia paresthetica, right lower limb: Secondary | ICD-10-CM

## 2014-10-08 DIAGNOSIS — L818 Other specified disorders of pigmentation: Secondary | ICD-10-CM | POA: Diagnosis not present

## 2014-10-08 MED ORDER — GABAPENTIN 800 MG PO TABS: 800.0000 mg | ORAL_TABLET | Freq: Four times a day (QID) | ORAL | Status: DC

## 2014-10-08 MED ORDER — OXCARBAZEPINE 150 MG PO TABS: ORAL_TABLET | ORAL | Status: DC

## 2014-10-08 NOTE — Telephone Encounter (Signed)
Rx has been sent  

## 2014-10-08 NOTE — Telephone Encounter (Signed)
Patient is currently here for OV.

## 2014-10-08 NOTE — Progress Notes (Signed)
[facility]  PATIENT: Melissa Rowe DOB: March 21, 1965   REFERRING CLINICIAN: Dr. [name] HISTORY FROM: Patient  REASON FOR VISIT: Lateral thigh pain   HISTORICAL  CHIEF COMPLAINT:  Chief Complaint  Patient presents with  . Meralgia paraesthetica    At last ov, she stated pain was much better with Gabapentin, lidocaine gel.  Dr. Felecia Shelling added Lamictal to hopefully help with remaining discomfort.  She sts. she was not able to tolerate Lamictal--sts. it caused h/a's and so her pcp advised her to stop it.  Sts. she is here today to discuss other tx. options/fim    HISTORY OF PRESENT ILLNESS:  Melissa Rowe returns to  [facility] for a neurologic follow-up visit regarding her right anterolateral thigh pain.   Pain is still moderate though much better than it was back in January.   She is on gabapentin 800 mg po qid and tolerates it well.  Lidocaine ointment also seems to helping some.    She stopped the lamotrigine due to feeling dizzy and having headaches.    Pain is located in anterolateral thigh in distribution of LFCN.  Pain is burning with allodynia -- touch and water running over leg is painful.   She tolerates gabapentin much better than Lyrica.      She could not tolerate Lyrica (weight gain).     LFCN History:   She had breast reduction surgery on November 4th and she reports that the right thigh was 'going to sleep' with numbness and tingling later that night.    Over the next week it got more painful and by December, the pain was very severe.  Pain was apparently so bad that she was screaming out in the middle of the night. She describes a gnawing pain that is constant but also has a stabbing pain frequently.   She was worked in at her Primary care provider's office, Dr. [name], and was diagnosed with right meralgia paresthetica.   Lyrica was started.  She saw neurosurgery and the Lyrica dose was increased from 75  to 150 mg twice a day with only  minimal relief. She returned to her primary care provider's office on 02/16/2014 and tramadol was added.    Of note, she gained about 40 pounds during the 4 weeks between her two primary care visits.   On the higher dose of Lyrica, the stabbing pain was helped but the more annoying constant pain was not. She was switched from Lyrica to gabapentin with benefit (02/2014).      Lamotrigine was not tolerated (added 03/2014).   Lidocaine ointment helps some (added 2/20165).    Oxcabazepine added 09/2014.      REVIEW OF SYSTEMS:  Constitutional: No fevers, chills, sweats, or change in appetite Eyes: No visual changes, double vision, eye pain Ear, nose and throat: No hearing loss, ear pain, nasal congestion, sore throat Cardiovascular: No chest pain, palpitations Respiratory:  No shortness of breath at rest or with exertion.   No wheezes GastrointestinaI: No nausea, vomiting, diarrhea, abdominal pain, fecal incontinence Genitourinary:  No dysuria, urinary retention or frequency.  No nocturia. Musculoskeletal:  No neck pain, some back pain, moderate knee pain - to have surgery Integumentary: No rash, pruritus, skin lesions Neurological: as above  ALLERGIES: Allergies  Allergen Reactions  . Lamictal [Lamotrigine] Other (See Comments)    headache     HOME MEDICATIONS: Outpatient Prescriptions Prior to Visit  Medication Sig Dispense Refill  . albuterol (PROVENTIL HFA;VENTOLIN HFA) 108 (90  BASE) MCG/ACT inhaler Inhale 2 puffs into the lungs every 6 (six) hours as needed for wheezing or shortness of breath. For shortness of breath.    . calcium-vitamin D (OSCAL WITH D) 500-200 MG-UNIT per tablet Take 1 tablet by mouth.    . cyclobenzaprine (FLEXERIL) 10 MG tablet Take 1 tablet (10 mg total) by mouth 2 (two) times daily as needed for muscle spasms. 20 tablet 0  . docusate sodium (COLACE) 250 MG capsule Take 250 mg by mouth.    . ferrous sulfate 325 (65 FE) MG tablet Take 325 mg by mouth daily with  breakfast.    . furosemide (LASIX) 40 MG tablet     . gabapentin (NEURONTIN) 800 MG tablet Take 1 tablet (800 mg total) by mouth 4 (four) times daily. 120 tablet 5  . HYDROcodone-acetaminophen (NORCO/VICODIN) 5-325 MG per tablet Take 1-2 tablets by mouth every 4 (four) hours as needed for moderate pain. 30 tablet 0  . levothyroxine (SYNTHROID, LEVOTHROID) 100 MCG tablet Take 100 mcg by mouth daily before breakfast.    . levothyroxine (SYNTHROID, LEVOTHROID) 100 MCG tablet Take 100 mcg by mouth.    . levothyroxine (SYNTHROID, LEVOTHROID) 75 MCG tablet Take 75 mcg by mouth daily before breakfast.    . lidocaine (XYLOCAINE) 5 % ointment Dispense 2 50 g tubes  Apply to right thigh twice a day 100 g 3  . mometasone-formoterol (DULERA) 100-5 MCG/ACT AERO Inhale 2 puffs into the lungs 2 (two) times daily.    . mometasone-formoterol (DULERA) 200-5 MCG/ACT AERO Inhale 2 puffs into the lungs.    . montelukast (SINGULAIR) 10 MG tablet Take 10 mg by mouth daily with breakfast.    . Multiple Vitamin (MULTIVITAMIN) capsule Take 1 capsule by mouth daily.     Earney Navy Bicarbonate (ZEGERID OTC PO) Take 1 tablet by mouth daily.    . polyethylene glycol (MIRALAX / GLYCOLAX) packet Take 17 g by mouth 2 (two) times daily.     [name] spironolactone (ALDACTONE) 25 MG tablet Take 25 mg by mouth daily.    . traMADol (ULTRAM) 50 MG tablet Take 50 mg by mouth 3 (three) times daily as needed for moderate pain.    [name] ibuprofen (ADVIL,MOTRIN) 800 MG tablet Take 1 tablet (800 mg total) by mouth 3 (three) times daily. (Patient not taking: Reported on 02/26/2014) 21 tablet 0  . naproxen (NAPROSYN) 500 MG tablet Take 500 mg by mouth 2 (two) times daily as needed for mild pain.    [name] albuterol (PROVENTIL HFA;VENTOLIN HFA) 108 (90 BASE) MCG/ACT inhaler Inhale 2 puffs into the lungs.    . calcium-vitamin D (OSCAL WITH D) 500-200 MG-UNIT per tablet Take 1 tablet by mouth.    . calcium-vitamin D (OSCAL WITH D) 500-200 MG-UNIT per  tablet Take by mouth.    . cyclobenzaprine (FLEXERIL) 10 MG tablet Take 10 mg by mouth.    . diazepam (VALIUM) 2 MG tablet Take 1 tablet (2 mg total) by mouth every 6 (six) hours as needed for muscle spasms. (Patient not taking: Reported on 02/26/2014) 30 tablet 0  . lamoTRIgine (LAMICTAL) 100 MG tablet Take 1 tablet (100 mg total) by mouth daily. (Patient not taking: Reported on 10/08/2014) 60 tablet 5  . lamoTRIgine (LAMICTAL) 25 MG tablet Take one pill daily x 1 week, then one pill twice daily x 1 week, then one pill three times daily x 1 week, then 2 pills twice daily x 1 week, then start other prescription (Patient not taking:  Reported on 10/08/2014) 70 tablet 0  . montelukast (SINGULAIR) 10 MG tablet TAKE ONE TABLET BY MOUTH ONCE DAILY (Patient not taking: Reported on 10/08/2014) 30 tablet 0  . montelukast (SINGULAIR) 10 MG tablet Take 10 mg by mouth.    . Multiple Vitamins tablet Take 1 tablet by mouth.    Earney Navy Bicarbonate (ZEGERID) 20-1100 MG CAPS capsule Take 1 capsule by mouth daily before breakfast.    . Omeprazole-Sodium Bicarbonate (ZEGERID) 20-1100 MG CAPS capsule Take by mouth.    . phentermine 15 MG capsule Take 15 mg by mouth.    . Phentermine-Topiramate (QSYMIA) 3.75-23 MG CP24 Take 1 tablet by mouth daily.    . predniSONE (DELTASONE) 10 MG tablet Take 4 tabs po x 2 days, then 2 x 2 days, then 1 x 2, then 1/2 x 2 days then stop. (Patient not taking: Reported on 10/08/2014) 15 tablet 0  . spironolactone (ALDACTONE) 25 MG tablet Take 25 mg by mouth.    . tolterodine (DETROL LA) 2 MG 24 hr capsule Take 2 mg by mouth daily.    . traMADol (ULTRAM) 50 MG tablet Take 50 mg by mouth.    . TRAMADOL HCL PO Take 1 tablet by mouth.     No facility-administered medications prior to visit.    PAST MEDICAL HISTORY: Past Medical History  Diagnosis Date  . Dyspnea   . Gastroenteritis   . Hyperlipidemia   . Diverticulitis   . Atrial mass   . Hypothyroidism   . Asthma   .  Urgency of urination   . Frequency of urination   . GERD (gastroesophageal reflux disease)   . Arthritis   . Environmental allergies   . Obesity   . OSA (obstructive sleep apnea)     wears CPAP night pt does not know settings  . Hemorrhoids     external with some bleeding  . Anemia sept /oct 2014  . Glaucoma   . Constipation   . PONV (postoperative nausea and vomiting)     states she gets sick on her stomach after surgery  . Neuropathy     PAST SURGICAL HISTORY: Past Surgical History  Procedure Laterality Date  . Cholecystectomy  2008  . Polyps removal  2007  . Tubal ligation  1992  . Carpal tunnel release      right  . Colonoscopy    . Knee arthroscopy  03/18/2012    Procedure: ARTHROSCOPY KNEE;  Surgeon: Mcarthur Rossetti, MD;  Location: [facility];  Service: Orthopedics;  Laterality: Right;  Right knee arthroscopy with debridement and three compartment chondroplasty  . Colonoscopy with propofol N/A 02/24/2013    Procedure: COLONOSCOPY WITH PROPOFOL;  Surgeon: Ladene Artist, MD;  Location: [facility];  Service: Endoscopy;  Laterality: N/A;  . Breast reduction surgery Bilateral 12/16/2013    Procedure: BILATERAL BREAST REDUCTION;  Surgeon: Theodoro Kos, DO;  Location: [facility];  Service: Plastics;  Laterality: Bilateral;  . Carpal tunnel release Right   . Breast reduction surgery Bilateral   . Cholecystectomy    . Tubal ligaton      FAMILY HISTORY: Family History  Problem Relation Age of Onset  . Heart murmur Mother     rheumatoid arthritis  . Heart attack Mother   . COPD Mother   . Diabetes Mother   . Asthma Brother     and mother both as a child  . Prostate cancer Father   . Kidney failure Father     SOCIAL  HISTORY:  Social History   Social History  . Marital Status: Married    Spouse Name: N/A  . Number of Children: 2  . Years of Education: N/A   Occupational History  . 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     Social History Main  Topics  . Smoking status: Former Smoker -- 2.00 packs/day for 7 years    Types: Cigarettes    Quit date: 02/12/1990  . Smokeless tobacco: Never Used  . Alcohol Use: Yes     Comment: rare  . Drug Use: No  . Sexual Activity: Not on file   Other Topics Concern  . Not on file   Social History Narrative     PHYSICAL EXAM  Filed Vitals:   10/08/14 0927  BP: 126/88  Pulse: 78  Resp: 18  Height: 5\' 6"  (1.676 m)  Weight: 348 lb 9.6 oz (158.124 kg)    Body mass index is 56.29 kg/(m^2).   General: The patient is well-developed and well-nourished and in no acute distress.   Much less agitated than last visit  Skin: Extremities are without significant edema.  Neurologic Exam  Mental status: The patient is alert and oriented x 3 at the time of the examination. The patient has apparent normal recent and remote memory, with an apparently normal attention span and concentration ability.   Speech is normal.  Cranial nerves: Extraocular movements are full.  Facial strength is normal.  Trapezius and sternocleidomastoid strength is normal. No dysarthria is noted.     Motor:  Muscle bulk and tone are normal. Strength is  5 / 5 in all 4 extremities.   Sensory: Sensory testing is intact to touch sense on all 4 extremities except for allodynia over the right anterolateral thigh  Gait and station: Station is normal and gait is arthritic.[name]   Reflexes: Deep tendon reflexes are symmetric and normal bilaterally.     DIAGNOSTIC DATA (LABS, IMAGING, TESTING) - I reviewed patient records, labs, notes, testing and imaging myself where available.  Lab Results  Component Value Date   WBC 16.2* 12/16/2013   HGB 10.3* 12/16/2013   HCT 31.2* 12/16/2013   MCV 63.3* 12/16/2013   PLT 357 12/16/2013      Component Value Date/Time   NA 137 12/16/2013 1730   K 4.6 12/16/2013 1730   CL 99 12/16/2013 1730   CO2 24 12/16/2013 1730   GLUCOSE 181* 12/16/2013 1730   BUN 10 12/16/2013 1730    CREATININE 0.74 12/16/2013 1730   CALCIUM 9.0 12/16/2013 1730   PROT 8.0 12/16/2013 0910   ALBUMIN 3.7 12/16/2013 0910   AST 13 12/16/2013 0910   ALT 13 12/16/2013 0910   ALKPHOS 75 12/16/2013 0910   BILITOT 0.2* 12/16/2013 0910   GFRNONAA >90 12/16/2013 1730   GFRAA >90 12/16/2013 1730   Lab Results  Component Value Date   CHOL 209* 09/22/2009   HDL 62 09/22/2009   LDLCALC 116* 09/22/2009   TRIG 156* 09/22/2009   CHOLHDL 3.4 Ratio 09/22/2009   Lab Results  Component Value Date   HGBA1C 5.7* 12/16/2013   No results found for: VITAMINB12 Lab Results  Component Value Date   TSH 6.759* 01/11/2009    CLINICAL DATA: Chronic low back pain for 7 years  EXAM: MRI LUMBAR SPINE WITHOUT CONTRAST  TECHNIQUE: Multiplanar, multisequence MR imaging was performed. No intravenous contrast was administered.  COMPARISON: 12/10/2006.  FINDINGS: There is a mild levoconvex curve of  the lumbar spine with the apex at L3. The spinal cord terminates at the L1 level. The paraspinal soft tissues are within normal limits. Marrow signal is normal.  T11-T12 and T12-L1 appear normal.  L1-L2: Negative.  L2-L3: Mild disc desiccation with shallow broad-based central protrusion but no resulting stenosis.  L3-L4: Disc desiccation and mild loss of height. Right foraminal protrusion is present with associated annular tear. This produces very mild right foraminal stenosis with the protrusion contacting exiting right L3 nerve in the foramen. The left neural foramen and central canal are patent. Disc protrusion just contacts the descending right L4 nerve as it approaches the lateral recess.  L4-L5: Disc desiccation. Central annular tear with right paracentral protrusion. Very mild central stenosis. Mild encroachment on the right lateral recess and descending right L5 nerve. The neural foramina appear adequately patent.  L5-S1: Degenerated disc with loss of height. Vacuum disc.  Central canal patent. Foramina appear adequately patent. Right paracentral broad-based protrusion is present mildly encroaching on the right S1 nerve in the lateral recess.  IMPRESSION: 1. L3-L4 right foraminal annular tear and protrusion with mild right foraminal stenosis potentially affecting the right L3 nerve. 2. L4-L5 annular tear and right paracentral protrusion with mild central stenosis and mild encroachment on the descending right L5 nerve. 3. L5-S1 disc degeneration with broad-based right paracentral protrusion minimally encroaching on the right lateral recess.   Electronically Signed  By: Dereck Ligas M.D.  On: 06/04/2013 08:03     ASSESSMENT AND PLAN  Meralgia paraesthetica, right  Morbid obesity   1.   She still has moderate allodynia over the lateral femoral cutaneous nerve neurotome and I will add oxcabazepine and titrate up to 150 mg x 3 daily.   If she tolerates it well and it is helping some, we can increase the dose.    2.  She will return to see me in 4 months or sooner if she has new or worsening neurologic symptoms.   Donyel Nester A. Felecia Shelling, MD, PhD 7/54/4920, 1:00 AM Certified in Neurology, Clinical Neurophysiology, Sleep Medicine, Pain Medicine and Neuroimaging  [facility] [address] [place], [place] [address] [phone]

## 2014-10-08 NOTE — Telephone Encounter (Signed)
The patient is needing a refill on gabapentin 800 Mg and wants it sent to the pharmacy at [facility] in [address] The best number to contact is [phone]

## 2014-10-19 DIAGNOSIS — L819 Disorder of pigmentation, unspecified: Secondary | ICD-10-CM | POA: Diagnosis not present

## 2014-10-26 DIAGNOSIS — R7301 Impaired fasting glucose: Secondary | ICD-10-CM | POA: Diagnosis not present

## 2014-10-26 DIAGNOSIS — N390 Urinary tract infection, site not specified: Secondary | ICD-10-CM | POA: Diagnosis not present

## 2014-10-26 DIAGNOSIS — E039 Hypothyroidism, unspecified: Secondary | ICD-10-CM | POA: Diagnosis not present

## 2014-10-26 DIAGNOSIS — R8290 Unspecified abnormal findings in urine: Secondary | ICD-10-CM | POA: Diagnosis not present

## 2014-10-26 DIAGNOSIS — Z79899 Other long term (current) drug therapy: Secondary | ICD-10-CM | POA: Diagnosis not present

## 2014-10-26 DIAGNOSIS — L816 Other disorders of diminished melanin formation: Secondary | ICD-10-CM | POA: Diagnosis not present

## 2014-11-04 DIAGNOSIS — E785 Hyperlipidemia, unspecified: Secondary | ICD-10-CM | POA: Diagnosis not present

## 2014-11-04 DIAGNOSIS — R7301 Impaired fasting glucose: Secondary | ICD-10-CM | POA: Diagnosis not present

## 2014-11-04 DIAGNOSIS — E039 Hypothyroidism, unspecified: Secondary | ICD-10-CM | POA: Diagnosis not present

## 2014-11-04 DIAGNOSIS — Z6841 Body Mass Index (BMI) 40.0 and over, adult: Secondary | ICD-10-CM | POA: Diagnosis not present

## 2014-11-04 DIAGNOSIS — D509 Iron deficiency anemia, unspecified: Secondary | ICD-10-CM | POA: Diagnosis not present

## 2014-11-04 DIAGNOSIS — G4733 Obstructive sleep apnea (adult) (pediatric): Secondary | ICD-10-CM | POA: Diagnosis not present

## 2014-11-04 DIAGNOSIS — Z0000 Encounter for general adult medical examination without abnormal findings: Secondary | ICD-10-CM | POA: Diagnosis not present

## 2014-11-04 DIAGNOSIS — M25561 Pain in right knee: Secondary | ICD-10-CM | POA: Diagnosis not present

## 2014-11-04 DIAGNOSIS — G5710 Meralgia paresthetica, unspecified lower limb: Secondary | ICD-10-CM | POA: Diagnosis not present

## 2014-11-04 DIAGNOSIS — Z1389 Encounter for screening for other disorder: Secondary | ICD-10-CM | POA: Diagnosis not present

## 2014-11-04 DIAGNOSIS — Z23 Encounter for immunization: Secondary | ICD-10-CM | POA: Diagnosis not present

## 2014-11-10 DIAGNOSIS — Z1212 Encounter for screening for malignant neoplasm of rectum: Secondary | ICD-10-CM | POA: Diagnosis not present

## 2014-11-16 DIAGNOSIS — Z9013 Acquired absence of bilateral breasts and nipples: Secondary | ICD-10-CM | POA: Diagnosis not present

## 2014-11-16 DIAGNOSIS — L818 Other specified disorders of pigmentation: Secondary | ICD-10-CM | POA: Diagnosis not present

## 2014-12-29 ENCOUNTER — Telehealth: Payer: Self-pay | Admitting: Internal Medicine

## 2014-12-29 NOTE — Telephone Encounter (Signed)
lmtcb x1 

## 2014-12-30 MED ORDER — ALBUTEROL SULFATE HFA 108 (90 BASE) MCG/ACT IN AERS: 2.0000 | INHALATION_SPRAY | Freq: Four times a day (QID) | RESPIRATORY_TRACT | Status: DC | PRN

## 2014-12-30 MED ORDER — MOMETASONE FURO-FORMOTEROL FUM 200-5 MCG/ACT IN AERO: 2.0000 | INHALATION_SPRAY | Freq: Two times a day (BID) | RESPIRATORY_TRACT | Status: DC

## 2014-12-30 NOTE — Telephone Encounter (Signed)
Spoke with pt. She needed her albuterol inhaler and dulera 200 mcg sent into [facility]. I have done so. Nothing further needed

## 2014-12-30 NOTE — Telephone Encounter (Signed)
Pt cb, [phone]

## 2015-01-19 DIAGNOSIS — M170 Bilateral primary osteoarthritis of knee: Secondary | ICD-10-CM | POA: Diagnosis not present

## 2015-02-10 ENCOUNTER — Ambulatory Visit: Payer: Medicare Other | Admitting: Neurology

## 2015-02-16 ENCOUNTER — Encounter: Payer: Self-pay | Admitting: Neurology

## 2015-02-16 ENCOUNTER — Ambulatory Visit (INDEPENDENT_AMBULATORY_CARE_PROVIDER_SITE_OTHER): Payer: Medicare Other | Admitting: Neurology

## 2015-02-16 VITALS — BP 138/78 | HR 80 | Resp 20 | Ht 66.0 in | Wt 367.8 lb

## 2015-02-16 DIAGNOSIS — G5711 Meralgia paresthetica, right lower limb: Secondary | ICD-10-CM | POA: Diagnosis not present

## 2015-02-16 DIAGNOSIS — R208 Other disturbances of skin sensation: Secondary | ICD-10-CM | POA: Diagnosis not present

## 2015-02-16 MED ORDER — OXCARBAZEPINE 300 MG PO TABS: ORAL_TABLET | ORAL | Status: DC

## 2015-02-16 NOTE — Progress Notes (Signed)
[facility]  PATIENT: Melissa Rowe DOB: September 06, 1965   REFERRING CLINICIAN: Dr. [name] HISTORY FROM: Patient  REASON FOR VISIT: Lateral thigh pain   HISTORICAL  CHIEF COMPLAINT:  Chief Complaint  Patient presents with  . Meralgia Paraesthetica    Sts. minimal relief of numbness, electrical shock sensation in right thigh, since Oxcarbazepine  added at last ov.  Sts. she continues Gabapentin as rx'd as well/fim    HISTORY OF PRESENT ILLNESS:  Melissa Rowe returns to  [facility] for a neurologic follow-up visit regarding her right anterolateral thigh pain.   She reports that the pain is still moderate though much better than it was back in January.   She is on gabapentin 800 mg po qid and oxcarbazepine 150 mg po tid and tolerates them well.  Lidocaine ointment also has helped some.    She stopped the lamotrigine due to feeling dizzy and having headaches.  Lyrica caused significant further weight gain.     Current pain is located in anterolateral thigh in distribution of LFCN.  Pain is burning with allodynia -- touch, certain clothes and water running over leg is painful.   There is no weakness  LFCN History:   She had breast reduction surgery on November 4th and she reports that the right thigh was 'going to sleep' with numbness and tingling later that night.    Over the next week it got more painful and by December, the pain was very severe.  Pain was apparently so bad that she was screaming out in the middle of the night. She describes a gnawing pain that is constant but also has a stabbing pain frequently.   She was worked in at her Primary care provider's office, Dr. [name], and was diagnosed with right meralgia paresthetica.   Lyrica was started.  She saw neurosurgery and the Lyrica dose was increased from 75  to 150 mg twice a day with only minimal relief. She returned to her primary care provider's office on 02/16/2014 and tramadol was added.     Of note, she gained about 40 pounds during the 4 weeks between her two primary care visits.   On the higher dose of Lyrica, the stabbing pain was helped but the more annoying constant pain was not. She was switched from Lyrica to gabapentin with benefit (02/2014).      Lamotrigine was not tolerated (added 03/2014).   Lidocaine ointment helps some (added 2/20165).    Oxcabazepine 150 mg tid added 09/2014.      REVIEW OF SYSTEMS:  Constitutional: No fevers, chills, sweats, or change in appetite Eyes: No visual changes, double vision, eye pain Ear, nose and throat: No hearing loss, ear pain, nasal congestion, sore throat Cardiovascular: No chest pain, palpitations Respiratory:  No shortness of breath at rest or with exertion.   No wheezes GastrointestinaI: No nausea, vomiting, diarrhea, abdominal pain, fecal incontinence Genitourinary:  No dysuria, urinary retention or frequency.  No nocturia. Musculoskeletal:  No neck pain, some back pain, moderate knee pain   Integumentary: No rash, pruritus, skin lesions Neurological: as above  ALLERGIES: Allergies  Allergen Reactions  . Lamictal [Lamotrigine] Other (See Comments)    headache     HOME MEDICATIONS: Outpatient Prescriptions Prior to Visit  Medication Sig Dispense Refill  . albuterol (PROVENTIL HFA;VENTOLIN HFA) 108 (90 BASE) MCG/ACT inhaler Inhale 2 puffs into the lungs every 6 (six) hours as needed. For shortness of breath. 1 Inhaler 3  . calcium-vitamin D (OSCAL  WITH D) 500-200 MG-UNIT per tablet Take 1 tablet by mouth.    . cyclobenzaprine (FLEXERIL) 10 MG tablet Take 1 tablet (10 mg total) by mouth 2 (two) times daily as needed for muscle spasms. 20 tablet 0  . docusate sodium (COLACE) 250 MG capsule Take 250 mg by mouth.    . ferrous sulfate 325 (65 FE) MG tablet Take 325 mg by mouth daily with breakfast.    . furosemide (LASIX) 40 MG tablet     . gabapentin (NEURONTIN) 800 MG tablet Take 1 tablet (800 mg total) by mouth 4 (four)  times daily. 120 tablet 5  . levothyroxine (SYNTHROID, LEVOTHROID) 75 MCG tablet Take 75 mcg by mouth daily before breakfast.    . lidocaine (XYLOCAINE) 5 % ointment Dispense 2 50 g tubes  Apply to right thigh twice a day 100 g 3  . mometasone-formoterol (DULERA) 200-5 MCG/ACT AERO Inhale 2 puffs into the lungs 2 (two) times daily. 1 Inhaler 3  . montelukast (SINGULAIR) 10 MG tablet Take 10 mg by mouth daily with breakfast.    . Multiple Vitamin (MULTIVITAMIN) capsule Take 1 capsule by mouth daily.     Earney Navy Bicarbonate (ZEGERID OTC PO) Take 1 tablet by mouth daily.    . OXcarbazepine (TRILEPTAL) 150 MG tablet Take 1 po in am and 2 at night 90 tablet 11  . polyethylene glycol (MIRALAX / GLYCOLAX) packet Take 17 g by mouth 2 (two) times daily.     [name] spironolactone (ALDACTONE) 25 MG tablet Take 25 mg by mouth daily.    . traMADol (ULTRAM) 50 MG tablet Take 50 mg by mouth 3 (three) times daily as needed for moderate pain.    [name] HYDROcodone-acetaminophen (NORCO/VICODIN) 5-325 MG per tablet Take 1-2 tablets by mouth every 4 (four) hours as needed for moderate pain. (Patient not taking: Reported on 02/16/2015) 30 tablet 0  . ibuprofen (ADVIL,MOTRIN) 800 MG tablet Take 1 tablet (800 mg total) by mouth 3 (three) times daily. (Patient not taking: Reported on 02/26/2014) 21 tablet 0  . levothyroxine (SYNTHROID, LEVOTHROID) 100 MCG tablet Take 100 mcg by mouth daily before breakfast. Reported on 02/16/2015    . naproxen (NAPROSYN) 500 MG tablet Take 500 mg by mouth 2 (two) times daily as needed for mild pain. Reported on 02/16/2015    . levothyroxine (SYNTHROID, LEVOTHROID) 100 MCG tablet Take 100 mcg by mouth. Reported on 02/16/2015     No facility-administered medications prior to visit.    PAST MEDICAL HISTORY: Past Medical History  Diagnosis Date  . Dyspnea   . Gastroenteritis   . Hyperlipidemia   . Diverticulitis   . Atrial mass   . Hypothyroidism   . Asthma   . Urgency of urination   .  Frequency of urination   . GERD (gastroesophageal reflux disease)   . Arthritis   . Environmental allergies   . Obesity   . OSA (obstructive sleep apnea)     wears CPAP night pt does not know settings  . Hemorrhoids     external with some bleeding  . Anemia sept /oct 2014  . Glaucoma   . Constipation   . PONV (postoperative nausea and vomiting)     states she gets sick on her stomach after surgery  . Neuropathy ([place])     PAST SURGICAL HISTORY: Past Surgical History  Procedure Laterality Date  . Cholecystectomy  2008  . Polyps removal  2007  . Tubal ligation  1992  . Carpal tunnel  release      right  . Colonoscopy    . Knee arthroscopy  03/18/2012    Procedure: ARTHROSCOPY KNEE;  Surgeon: Mcarthur Rossetti, MD;  Location: [place] [facility];  Service: Orthopedics;  Laterality: Right;  Right knee arthroscopy with debridement and three compartment chondroplasty  . Colonoscopy with propofol N/A 02/24/2013    Procedure: COLONOSCOPY WITH PROPOFOL;  Surgeon: Ladene Artist, MD;  Location: [facility];  Service: Endoscopy;  Laterality: N/A;  . Breast reduction surgery Bilateral 12/16/2013    Procedure: BILATERAL BREAST REDUCTION;  Surgeon: Theodoro Kos, DO;  Location: [place];  Service: Plastics;  Laterality: Bilateral;  . Carpal tunnel release Right   . Breast reduction surgery Bilateral   . Cholecystectomy    . Tubal ligaton      FAMILY HISTORY: Family History  Problem Relation Age of Onset  . Heart murmur Mother     rheumatoid arthritis  . Heart attack Mother   . COPD Mother   . Diabetes Mother   . Asthma Brother     and mother both as a child  . Prostate cancer Father   . Kidney failure Father     SOCIAL HISTORY:  Social History   Social History  . Marital Status: Married    Spouse Name: N/A  . Number of Children: 2  . Years of Education: N/A   Occupational History  . 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     Social History Main Topics  . Smoking  status: Former Smoker -- 2.00 packs/day for 7 years    Types: Cigarettes    Quit date: 02/12/1990  . Smokeless tobacco: Never Used  . Alcohol Use: Yes     Comment: rare  . Drug Use: No  . Sexual Activity: Not on file   Other Topics Concern  . Not on file   Social History Narrative     PHYSICAL EXAM  Filed Vitals:   02/16/15 1528  BP: 138/78  Pulse: 80  Resp: 20  Height: 5\' 6"  (1.676 m)  Weight: 367 lb 12.8 oz (166.833 kg)    Body mass index is 59.39 kg/(m^2).   General: The patient is well-developed and well-nourished and in no acute distress.       Neurologic Exam  Mental status: The patient is alert and oriented x 3 at the time of the examination. The patient has apparent normal recent and remote memory, with an apparently normal attention span and concentration ability.   Speech is normal.  Cranial nerves: Extraocular movements are full.  Facial strength is normal.  Trapezius and sternocleidomastoid strength is normal. No dysarthria is noted.     Motor:  Muscle bulk and tone are normal. Strength is  5 / 5 in all 4 extremities.   Sensory: Sensory testing is intact to touch sense on all 4 extremities except for allodynia over the right anterolateral thigh  Gait and station: Station is normal and gait is arthritic with a small stride..   Reflexes: Deep tendon reflexes are symmetric and normal bilaterally.     DIAGNOSTIC DATA (LABS, IMAGING, TESTING) - I reviewed patient records, labs, notes, testing and imaging myself where available.  Lab Results  Component Value Date   WBC 16.2* 12/16/2013   HGB 10.3* 12/16/2013   HCT 31.2* 12/16/2013   MCV 63.3* 12/16/2013   PLT 357 12/16/2013      Component Value Date/Time   NA 137 12/16/2013 1730  K 4.6 12/16/2013 1730   CL 99 12/16/2013 1730   CO2 24 12/16/2013 1730   GLUCOSE 181* 12/16/2013 1730   BUN 10 12/16/2013 1730   CREATININE 0.74 12/16/2013 1730   CALCIUM 9.0 12/16/2013 1730   PROT 8.0 12/16/2013  0910   ALBUMIN 3.7 12/16/2013 0910   AST 13 12/16/2013 0910   ALT 13 12/16/2013 0910   ALKPHOS 75 12/16/2013 0910   BILITOT 0.2* 12/16/2013 0910   GFRNONAA >90 12/16/2013 1730   GFRAA >90 12/16/2013 1730   Lab Results  Component Value Date   CHOL 209* 09/22/2009   HDL 62 09/22/2009   LDLCALC 116* 09/22/2009   TRIG 156* 09/22/2009   CHOLHDL 3.4 Ratio 09/22/2009   Lab Results  Component Value Date   HGBA1C 5.7* 12/16/2013   No results found for: VITAMINB12 Lab Results  Component Value Date   TSH 6.759* 01/11/2009    CLINICAL DATA: Chronic low back pain for 7 years  EXAM: MRI LUMBAR SPINE WITHOUT CONTRAST  TECHNIQUE: Multiplanar, multisequence MR imaging was performed. No intravenous contrast was administered.  COMPARISON: 12/10/2006.  FINDINGS: There is a mild levoconvex curve of the lumbar spine with the apex at L3. The spinal cord terminates at the L1 level. The paraspinal soft tissues are within normal limits. Marrow signal is normal.  T11-T12 and T12-L1 appear normal.  L1-L2: Negative.  L2-L3: Mild disc desiccation with shallow broad-based central protrusion but no resulting stenosis.  L3-L4: Disc desiccation and mild loss of height. Right foraminal protrusion is present with associated annular tear. This produces very mild right foraminal stenosis with the protrusion contacting exiting right L3 nerve in the foramen. The left neural foramen and central canal are patent. Disc protrusion just contacts the descending right L4 nerve as it approaches the lateral recess.  L4-L5: Disc desiccation. Central annular tear with right paracentral protrusion. Very mild central stenosis. Mild encroachment on the right lateral recess and descending right L5 nerve. The neural foramina appear adequately patent.  L5-S1: Degenerated disc with loss of height. Vacuum disc. Central canal patent. Foramina appear adequately patent. Right paracentral broad-based  protrusion is present mildly encroaching on the right S1 nerve in the lateral recess.  IMPRESSION: 1. L3-L4 right foraminal annular tear and protrusion with mild right foraminal stenosis potentially affecting the right L3 nerve. 2. L4-L5 annular tear and right paracentral protrusion with mild central stenosis and mild encroachment on the descending right L5 nerve. 3. L5-S1 disc degeneration with broad-based right paracentral protrusion minimally encroaching on the right lateral recess.   Electronically Signed  By: Dereck Ligas M.D.  On: 06/04/2013 08:03     ASSESSMENT AND PLAN  Meralgia paraesthetica, right - Plan: Ambulatory referral to Pain Clinic  Allodynia  Morbid obesity, unspecified obesity type ([place])    1.   She has moderate allodynia over the lateral femoral cutaneous nerve neurotome.   Increase  oxcabazepine to 300 mg x 3 daily.     2.  She asked about the association between the LFCN neuropathy and her surgery.   She has severe obesity which by itself is a major risk factor for LFC neuropathy.    Possibly positioning/immobilation during or after her procedure was a contributing factor.  However, I have absolutely no way of knowing or guessing a percent contributing factor. 3.   She also asked about prognosis.    She is better but still has allodynia over the LFCN neurotome.   Since symptoms are > 1 year, symptoms may continue  to persist much longer, though I cannot predict how much longer.    I don't believe a NCV would provide any useful information as the waveform is difficult to obtain in obese individuals.      4.   We further discussed treatment.    Medications have helped and she is clearly more comfortable than she was at her first visit.  However, allodynia (touch feeling like pain) persists.   Increasing oxcarbazepine may help.   She will likely need to take medications longterm.   I will also have her seen at [facility] to see if she might  benefit from a nerve block and/or RFA or similar procedure.   She will return to see me in 4 months or sooner if she has new or worsening neurologic symptoms.  30 minute face to face evaluation with greater than one half of the time counseling and coordinating care about her lateral femoral cutaneous neuropathy, etiology, prognosis and further treatment options   Jasey Cortez A. Felecia Shelling, MD, PhD [id number], [id number] PM Certified in Neurology, Clinical Neurophysiology, Sleep Medicine, Pain Medicine and Neuroimaging  [facility] [address] [place], [place] [address] [phone]

## 2015-02-23 ENCOUNTER — Ambulatory Visit (INDEPENDENT_AMBULATORY_CARE_PROVIDER_SITE_OTHER): Payer: Medicare Other | Admitting: Internal Medicine

## 2015-02-23 ENCOUNTER — Encounter: Payer: Self-pay | Admitting: Internal Medicine

## 2015-02-23 VITALS — BP 126/70 | HR 94 | Ht 66.0 in | Wt 367.0 lb

## 2015-02-23 DIAGNOSIS — J4541 Moderate persistent asthma with (acute) exacerbation: Secondary | ICD-10-CM

## 2015-02-23 MED ORDER — PREDNISONE 10 MG PO TABS: ORAL_TABLET | ORAL | Status: DC

## 2015-02-23 NOTE — Progress Notes (Signed)
Subjective:     Patient ID: Melissa Rowe, female   DOB: 04-Nov-1965, 50 y.o.   MRN: [id number]  HPI    OV 02/23/2015  Chief Complaint  Patient presents with  . Follow-up    Pt states her breathing is doing well. Pt states she has neighbors that smoke and it will bother her asthma when she is around the smoke. Pt states she is wearing CPAP nightly, pt denies issues with machine, mask, and pressure.    follow-up asthma and out of proportion dyspnea in the setting of morbid obesity  Last seen in September 2015. Reports worsening symptoms. This follows moving to an apartment in April 2016. Apparently the adjacent apartment there was a lot of smoke and marijuana smell. In June 2016 the people living there were arrested and have elected because there were drug dealers. Apparently that cut a full to the duct system to store marijuana. After they were evicted she started feeling better but then in August 2016 another family moved in and they smoke both cigarettes and marijuana. Since then she's having worsening symptoms. Her current lesions in April 2017 and she is looking forward to moving out. At this point in time she is significantly symptomatic.  Asthma control questionnaire 5. shows she wakes up in the middle of the night several times. As soon as she gets up she is quite severe symptoms of asthma. She is very limited because of asthma. She attributes much of her shortness of breath due to asthma and describes dyspnea is quite a lot. She also wheezing a lot of the time. She is using albuterol rescue 13-16 puffs inhalations on most days. Average score is is nearly 4 shows significant symptoms   Reports she is compliant with both Dulera and Singulair  Exhaled nitric oxide today is fairly normal in the 20s. Parts per billion     reports that she quit smoking about 25 years ago. Her smoking use included Cigarettes. She has a 14 pack-year smoking history. She has never used smokeless  tobacco.   Current outpatient prescriptions:  .  albuterol (PROVENTIL HFA;VENTOLIN HFA) 108 (90 BASE) MCG/ACT inhaler, Inhale 2 puffs into the lungs every 6 (six) hours as needed. For shortness of breath., Disp: 1 Inhaler, Rfl: 3 .  calcium-vitamin D (OSCAL WITH D) 500-200 MG-UNIT per tablet, Take 1 tablet by mouth., Disp: , Rfl:  .  cyclobenzaprine (FLEXERIL) 10 MG tablet, Take 1 tablet (10 mg total) by mouth 2 (two) times daily as needed for muscle spasms., Disp: 20 tablet, Rfl: 0 .  docusate sodium (COLACE) 250 MG capsule, Take 250 mg by mouth., Disp: , Rfl:  .  ferrous sulfate 325 (65 FE) MG tablet, Take 325 mg by mouth daily with breakfast., Disp: , Rfl:  .  furosemide (LASIX) 40 MG tablet, , Disp: , Rfl:  .  gabapentin (NEURONTIN) 800 MG tablet, Take 1 tablet (800 mg total) by mouth 4 (four) times daily., Disp: 120 tablet, Rfl: 5 .  levothyroxine (SYNTHROID, LEVOTHROID) 100 MCG tablet, Take 100 mcg by mouth daily before breakfast. Reported on 02/16/2015, Disp: , Rfl:  .  lidocaine (XYLOCAINE) 5 % ointment, Dispense 2 50 g tubes  Apply to right thigh twice a day, Disp: 100 g, Rfl: 3 .  mometasone-formoterol (DULERA) 200-5 MCG/ACT AERO, Inhale 2 puffs into the lungs 2 (two) times daily., Disp: 1 Inhaler, Rfl: 3 .  montelukast (SINGULAIR) 10 MG tablet, Take 10 mg by mouth daily with breakfast., Disp: , Rfl:  .  Multiple Vitamin (MULTIVITAMIN) capsule, Take 1 capsule by mouth daily. , Disp: , Rfl:  .  naproxen (NAPROSYN) 500 MG tablet, Take 500 mg by mouth 2 (two) times daily as needed for mild pain. Reported on 02/16/2015, Disp: , Rfl:  .  Omeprazole-Sodium Bicarbonate (ZEGERID OTC PO), Take 1 tablet by mouth daily., Disp: , Rfl:  .  OXcarbazepine (TRILEPTAL) 300 MG tablet, Take 1 po in am and 2 at night, Disp: 90 tablet, Rfl: 11 .  polyethylene glycol (MIRALAX / GLYCOLAX) packet, Take 17 g by mouth 2 (two) times daily. , Disp: , Rfl:  .  spironolactone (ALDACTONE) 25 MG tablet, Take 25 mg by  mouth daily., Disp: , Rfl:  .  traMADol (ULTRAM) 50 MG tablet, Take 50 mg by mouth 3 (three) times daily as needed for moderate pain., Disp: , Rfl:    Immunization History  Administered Date(s) Administered  . Influenza Split 10/26/2010, 02/12/2012, 12/13/2012  . Influenza Whole 11/08/2009  . Influenza,inj,Quad PF,36+ Mos 10/14/2014  . Tdap 09/22/2013    Allergies  Allergen Reactions  . Lamictal [Lamotrigine] Other (See Comments)    headache       Review of Systems Per HPI    Objective:   Physical Exam  Constitutional: She is oriented to person, place, and time. She appears well-developed and well-nourished. No distress.  Body mass index is 59.26 kg/(m^2).   HENT:  Head: Normocephalic and atraumatic.  Right Ear: External ear normal.  Left Ear: External ear normal.  Mouth/Throat: Oropharynx is clear and moist. No oropharyngeal exudate.  Eyes: Conjunctivae and EOM are normal. Pupils are equal, round, and reactive to light. Right eye exhibits no discharge. Left eye exhibits no discharge. No scleral icterus.  Neck: Normal range of motion. Neck supple. No JVD present. No tracheal deviation present. No thyromegaly present.  Cardiovascular: Normal rate, regular rhythm, normal heart sounds and intact distal pulses.  Exam reveals no gallop and no friction rub.   No murmur heard. Pulmonary/Chest: Effort normal and breath sounds normal. No respiratory distress. She has no wheezes. She has no rales. She exhibits no tenderness.  Abdominal: Soft. Bowel sounds are normal. She exhibits no distension and no mass. There is no tenderness. There is no rebound and no guarding.  Musculoskeletal: Normal range of motion. She exhibits no edema or tenderness.  Lymphadenopathy:    She has no cervical adenopathy.  Neurological: She is alert and oriented to person, place, and time. She has normal reflexes. No cranial nerve deficit. She exhibits normal muscle tone. Coordination normal.  Skin: Skin is  warm and dry. No rash noted. She is not diaphoretic. No erythema. No pallor.  Psychiatric: She has a normal mood and affect. Her behavior is normal. Judgment and thought content normal.  Vitals reviewed.   Filed Vitals:   02/23/15 1655  BP: 126/70  Pulse: 94  Height: 5\' 6"  (1.676 m)  Weight: 367 lb (166.47 kg)  SpO2: 98%    Estimated body mass index is 59.26 kg/(m^2) as calculated from the following:   Height as of this encounter: 5\' 6"  (1.676 m).   Weight as of this encounter: 367 lb (166.47 kg).     Assessment:       ICD-9-CM ICD-10-CM   1. Asthma with acute exacerbation, moderate persistent 493.92 J45.41    She is over perception of symptoms significantly in the setting of a normal exam nitric oxide. She nevertheless wants to do steroid course. She feels the symptoms only improved after she  leaves the apartment in April 2017. I will give her the benefit of doubt that the normal exam nitric oxide is potentially false negative    Plan:      Asthma  - is acting up due to  marijuana and cigarettte smoke exposure at apartment due to cross contamination - Take prednisone 40 mg daily x 2 days, then 20mg  daily x 2 days, then 10mg  daily x 2 days, then 5mg  daily x 2 days and stop - Continue dulera and singulair as before; we will ensure refill  #Followup 6 months - ACQ and FeNO at followup #Asthma  - is acting up due to  marijuana and cigarettte smoke exposure at apartment due to cross contamination - Take prednisone 40 mg daily x 2 days, then 20mg  daily x 2 days, then 10mg  daily x 2 days, then 5mg  daily x 2 days and stop - Continue dulera and singulair as before; we will ensure refill  #Followup 6 months - ACQ and FeNO at followup   Dr. Brand Males, M.D., [facility].C.P Pulmonary and Critical Care Medicine Staff Physician [place] [facility] Pager: [phone], If no answer or between  15:00h - 7:00h: call [phone]  02/23/2015 5:29  PM

## 2015-02-23 NOTE — Patient Instructions (Addendum)
#  Asthma  - is acting up due to  marijuana and cigarettte smoke exposure at apartment due to cross contamination - Take prednisone 40 mg daily x 2 days, then 20mg  daily x 2 days, then 10mg  daily x 2 days, then 5mg  daily x 2 days and stop - Continue dulera and singulair as before; we will ensure refill  #Followup 6 months - ACQ and FeNO at followup

## 2015-02-24 LAB — NITRIC OXIDE: NITRIC OXIDE: 29

## 2015-02-24 NOTE — Addendum Note (Signed)
Addended by: Maurice March on: 02/24/2015 09:59 AM   Modules accepted: Orders

## 2015-03-17 DIAGNOSIS — Z79899 Other long term (current) drug therapy: Secondary | ICD-10-CM | POA: Diagnosis not present

## 2015-03-17 DIAGNOSIS — Z5181 Encounter for therapeutic drug level monitoring: Secondary | ICD-10-CM | POA: Diagnosis not present

## 2015-03-17 DIAGNOSIS — G5711 Meralgia paresthetica, right lower limb: Secondary | ICD-10-CM | POA: Diagnosis not present

## 2015-03-25 DIAGNOSIS — L501 Idiopathic urticaria: Secondary | ICD-10-CM | POA: Diagnosis not present

## 2015-03-25 DIAGNOSIS — Z6841 Body Mass Index (BMI) 40.0 and over, adult: Secondary | ICD-10-CM | POA: Diagnosis not present

## 2015-04-17 DIAGNOSIS — E872 Acidosis: Secondary | ICD-10-CM | POA: Diagnosis not present

## 2015-04-17 DIAGNOSIS — M1990 Unspecified osteoarthritis, unspecified site: Secondary | ICD-10-CM | POA: Diagnosis not present

## 2015-04-17 DIAGNOSIS — I517 Cardiomegaly: Secondary | ICD-10-CM | POA: Diagnosis not present

## 2015-04-17 DIAGNOSIS — R0789 Other chest pain: Secondary | ICD-10-CM | POA: Diagnosis not present

## 2015-04-17 DIAGNOSIS — Z96659 Presence of unspecified artificial knee joint: Secondary | ICD-10-CM | POA: Diagnosis present

## 2015-04-17 DIAGNOSIS — G4733 Obstructive sleep apnea (adult) (pediatric): Secondary | ICD-10-CM | POA: Diagnosis not present

## 2015-04-17 DIAGNOSIS — R05 Cough: Secondary | ICD-10-CM | POA: Diagnosis not present

## 2015-04-17 DIAGNOSIS — R0602 Shortness of breath: Secondary | ICD-10-CM | POA: Diagnosis not present

## 2015-04-17 DIAGNOSIS — Z87891 Personal history of nicotine dependence: Secondary | ICD-10-CM | POA: Diagnosis not present

## 2015-04-17 DIAGNOSIS — E876 Hypokalemia: Secondary | ICD-10-CM | POA: Diagnosis not present

## 2015-04-17 DIAGNOSIS — J45901 Unspecified asthma with (acute) exacerbation: Secondary | ICD-10-CM | POA: Diagnosis present

## 2015-04-17 DIAGNOSIS — I5032 Chronic diastolic (congestive) heart failure: Secondary | ICD-10-CM | POA: Diagnosis present

## 2015-04-17 DIAGNOSIS — Z8679 Personal history of other diseases of the circulatory system: Secondary | ICD-10-CM | POA: Diagnosis not present

## 2015-04-17 DIAGNOSIS — J189 Pneumonia, unspecified organism: Secondary | ICD-10-CM | POA: Diagnosis not present

## 2015-04-17 DIAGNOSIS — A419 Sepsis, unspecified organism: Secondary | ICD-10-CM | POA: Diagnosis not present

## 2015-04-17 DIAGNOSIS — Z7952 Long term (current) use of systemic steroids: Secondary | ICD-10-CM | POA: Diagnosis not present

## 2015-04-17 DIAGNOSIS — E079 Disorder of thyroid, unspecified: Secondary | ICD-10-CM | POA: Diagnosis not present

## 2015-04-17 DIAGNOSIS — Z79899 Other long term (current) drug therapy: Secondary | ICD-10-CM | POA: Diagnosis not present

## 2015-04-29 ENCOUNTER — Emergency Department (HOSPITAL_COMMUNITY)
Admission: EM | Admit: 2015-04-29 | Discharge: 2015-04-29 | Disposition: A | Discharge status: Home / Self Care | Payer: Medicare Other | Attending: Emergency Medicine | Admitting: Emergency Medicine

## 2015-04-29 ENCOUNTER — Emergency Department (HOSPITAL_COMMUNITY): Payer: Medicare Other

## 2015-04-29 ENCOUNTER — Encounter (HOSPITAL_COMMUNITY): Payer: Self-pay

## 2015-04-29 ENCOUNTER — Other Ambulatory Visit (INDEPENDENT_AMBULATORY_CARE_PROVIDER_SITE_OTHER): Payer: Medicare Other

## 2015-04-29 ENCOUNTER — Ambulatory Visit (INDEPENDENT_AMBULATORY_CARE_PROVIDER_SITE_OTHER)
Admission: RE | Admit: 2015-04-29 | Discharge: 2015-04-29 | Disposition: A | Discharge status: Home / Self Care | Payer: Medicare Other | Source: Ambulatory Visit | Attending: Acute Care | Admitting: Acute Care

## 2015-04-29 ENCOUNTER — Telehealth: Payer: Self-pay | Admitting: Acute Care

## 2015-04-29 ENCOUNTER — Encounter: Payer: Self-pay | Admitting: Acute Care

## 2015-04-29 ENCOUNTER — Ambulatory Visit (INDEPENDENT_AMBULATORY_CARE_PROVIDER_SITE_OTHER): Payer: Medicare Other | Admitting: Acute Care

## 2015-04-29 VITALS — BP 122/72 | HR 110 | Temp 98.2°F | Ht 66.0 in | Wt 371.8 lb

## 2015-04-29 DIAGNOSIS — Z7951 Long term (current) use of inhaled steroids: Secondary | ICD-10-CM | POA: Diagnosis not present

## 2015-04-29 DIAGNOSIS — Z8701 Personal history of pneumonia (recurrent): Secondary | ICD-10-CM

## 2015-04-29 DIAGNOSIS — K5900 Constipation, unspecified: Secondary | ICD-10-CM | POA: Insufficient documentation

## 2015-04-29 DIAGNOSIS — J4541 Moderate persistent asthma with (acute) exacerbation: Secondary | ICD-10-CM | POA: Diagnosis not present

## 2015-04-29 DIAGNOSIS — J4540 Moderate persistent asthma, uncomplicated: Secondary | ICD-10-CM | POA: Diagnosis not present

## 2015-04-29 DIAGNOSIS — Z9981 Dependence on supplemental oxygen: Secondary | ICD-10-CM | POA: Diagnosis not present

## 2015-04-29 DIAGNOSIS — M1990 Unspecified osteoarthritis, unspecified site: Secondary | ICD-10-CM | POA: Insufficient documentation

## 2015-04-29 DIAGNOSIS — E669 Obesity, unspecified: Secondary | ICD-10-CM | POA: Diagnosis not present

## 2015-04-29 DIAGNOSIS — E785 Hyperlipidemia, unspecified: Secondary | ICD-10-CM | POA: Diagnosis not present

## 2015-04-29 DIAGNOSIS — J209 Acute bronchitis, unspecified: Secondary | ICD-10-CM

## 2015-04-29 DIAGNOSIS — G4733 Obstructive sleep apnea (adult) (pediatric): Secondary | ICD-10-CM | POA: Diagnosis not present

## 2015-04-29 DIAGNOSIS — G629 Polyneuropathy, unspecified: Secondary | ICD-10-CM | POA: Insufficient documentation

## 2015-04-29 DIAGNOSIS — Z79899 Other long term (current) drug therapy: Secondary | ICD-10-CM | POA: Diagnosis not present

## 2015-04-29 DIAGNOSIS — E039 Hypothyroidism, unspecified: Secondary | ICD-10-CM | POA: Insufficient documentation

## 2015-04-29 DIAGNOSIS — K219 Gastro-esophageal reflux disease without esophagitis: Secondary | ICD-10-CM | POA: Insufficient documentation

## 2015-04-29 DIAGNOSIS — R0602 Shortness of breath: Secondary | ICD-10-CM | POA: Diagnosis present

## 2015-04-29 DIAGNOSIS — J45901 Unspecified asthma with (acute) exacerbation: Secondary | ICD-10-CM

## 2015-04-29 DIAGNOSIS — R069 Unspecified abnormalities of breathing: Secondary | ICD-10-CM | POA: Diagnosis not present

## 2015-04-29 DIAGNOSIS — D649 Anemia, unspecified: Secondary | ICD-10-CM | POA: Diagnosis not present

## 2015-04-29 DIAGNOSIS — Z87891 Personal history of nicotine dependence: Secondary | ICD-10-CM | POA: Diagnosis not present

## 2015-04-29 DIAGNOSIS — R05 Cough: Secondary | ICD-10-CM | POA: Diagnosis not present

## 2015-04-29 LAB — BASIC METABOLIC PANEL
ANION GAP: 14 (ref 5–15)
BUN: 18 mg/dL (ref 6–23)
BUN: 17 mg/dL (ref 6–20)
CALCIUM: 9.9 mg/dL (ref 8.4–10.5)
CALCIUM: 9.4 mg/dL (ref 8.9–10.3)
CHLORIDE: 102 meq/L (ref 96–112)
CO2: 31 meq/L (ref 19–32)
CO2: 22 mmol/L (ref 22–32)
CREATININE: 0.83 mg/dL (ref 0.44–1.00)
Chloride: 104 mmol/L (ref 101–111)
Creatinine, Ser: 0.88 mg/dL (ref 0.40–1.20)
GFR calc Af Amer: >60 mL/min (ref >60)
GFR: 88.26 mL/min (ref >60.00)
GLUCOSE: 94 mg/dL (ref 65–99)
Glucose, Bld: 94 mg/dL (ref 70–99)
POTASSIUM: 4.2 meq/L (ref 3.5–5.1)
Potassium: 4.9 mmol/L (ref 3.5–5.1)
SODIUM: 141 meq/L (ref 135–145)
Sodium: 140 mmol/L (ref 135–145)

## 2015-04-29 LAB — CBC WITH DIFFERENTIAL/PLATELET
BASOS ABS: 0.1 10*3/uL (ref 0.0–0.1)
BASOS ABS: 0 10*3/uL (ref 0.0–0.1)
Basophils Relative: 0.7 % (ref 0.0–3.0)
Basophils Relative: 0 %
EOS ABS: 0.1 10*3/uL (ref 0.0–0.7)
EOS PCT: 0.8 % (ref 0.0–5.0)
Eosinophils Absolute: 0.1 10*3/uL (ref 0.0–0.7)
Eosinophils Relative: 1 %
HCT: 38.7 % (ref 36.0–46.0)
HCT: 39.4 % (ref 36.0–46.0)
Hemoglobin: 12.5 g/dL (ref 12.0–15.0)
Hemoglobin: 13.1 g/dL (ref 12.0–15.0)
LYMPHS ABS: 3.7 10*3/uL (ref 0.7–4.0)
LYMPHS PCT: 28.8 % (ref 12.0–46.0)
LYMPHS PCT: 29 %
Lymphs Abs: 3.7 10*3/uL (ref 0.7–4.0)
MCH: 20.9 pg — ABNORMAL LOW (ref 26.0–34.0)
MCHC: 32.3 g/dL (ref 30.0–36.0)
MCHC: 33.2 g/dL (ref 30.0–36.0)
MCV: 62.9 fL — ABNORMAL LOW (ref 78.0–100.0)
MONO ABS: 0.8 10*3/uL (ref 0.1–1.0)
MONOS PCT: 4.1 % (ref 3.0–12.0)
Monocytes Absolute: 0.5 10*3/uL (ref 0.1–1.0)
Monocytes Relative: 6 %
NEUTROS ABS: 8.5 10*3/uL — AB (ref 1.4–7.7)
NEUTROS ABS: 8 10*3/uL — AB (ref 1.7–7.7)
Neutrophils Relative %: 65.6 % (ref 43.0–77.0)
Neutrophils Relative %: 64 %
PLATELETS: 330 10*3/uL (ref 150.0–400.0)
PLATELETS: 328 10*3/uL (ref 150–400)
RBC: 5.99 Mil/uL — ABNORMAL HIGH (ref 3.87–5.11)
RBC: 6.26 MIL/uL — ABNORMAL HIGH (ref 3.87–5.11)
RDW: 18.5 % — ABNORMAL HIGH (ref 11.5–15.5)
RDW: 17.8 % — AB (ref 11.5–15.5)
WBC: 13 10*3/uL — ABNORMAL HIGH (ref 4.0–10.5)
WBC: 12.6 10*3/uL — ABNORMAL HIGH (ref 4.0–10.5)

## 2015-04-29 LAB — D-DIMER, QUANTITATIVE: D-Dimer, Quant: 0.31 ug/mL-FEU (ref 0.00–0.48)

## 2015-04-29 MED ORDER — LEVALBUTEROL HCL 0.63 MG/3ML IN NEBU
0.6300 mg | INHALATION_SOLUTION | Freq: Once | RESPIRATORY_TRACT | Status: AC
  Administered 2015-04-29: 0.63 mg via RESPIRATORY_TRACT

## 2015-04-29 MED ORDER — DEXAMETHASONE SODIUM PHOSPHATE 10 MG/ML IJ SOLN
10.0000 mg | Freq: Once | INTRAMUSCULAR | Status: AC
  Administered 2015-04-29: 10 mg via INTRAMUSCULAR
  Filled 2015-04-29: qty 1

## 2015-04-29 MED ORDER — IPRATROPIUM-ALBUTEROL 0.5-2.5 (3) MG/3ML IN SOLN
3.0000 mL | Freq: Once | RESPIRATORY_TRACT | Status: AC
  Administered 2015-04-29: 3 mL via RESPIRATORY_TRACT
  Filled 2015-04-29: qty 3

## 2015-04-29 MED ORDER — ALBUTEROL SULFATE (2.5 MG/3ML) 0.083% IN NEBU: 2.5000 mg | INHALATION_SOLUTION | Freq: Four times a day (QID) | RESPIRATORY_TRACT | Status: DC | PRN

## 2015-04-29 MED ORDER — PREDNISONE 20 MG PO TABS: 40.0000 mg | ORAL_TABLET | Freq: Every day | ORAL | Status: DC

## 2015-04-29 MED ORDER — HYDROCODONE-HOMATROPINE 5-1.5 MG/5ML PO SYRP: 5.0000 mL | ORAL_SOLUTION | Freq: Four times a day (QID) | ORAL | Status: DC | PRN

## 2015-04-29 MED ORDER — PREDNISONE 10 MG PO TABS: ORAL_TABLET | ORAL | Status: DC

## 2015-04-29 MED ORDER — METHYLPREDNISOLONE ACETATE 80 MG/ML IJ SUSP
80.0000 mg | Freq: Once | INTRAMUSCULAR | Status: AC
  Administered 2015-04-29: 80 mg via INTRAMUSCULAR

## 2015-04-29 MED ORDER — ALBUTEROL (5 MG/ML) CONTINUOUS INHALATION SOLN
5.0000 mg/h | INHALATION_SOLUTION | Freq: Once | RESPIRATORY_TRACT | Status: AC
  Administered 2015-04-29: 5 mg/h via RESPIRATORY_TRACT
  Filled 2015-04-29: qty 20

## 2015-04-29 NOTE — ED Notes (Signed)
Per EMS, pt from MD office across street.  Recently treated for pneumonia and was there for follow up.  During cxr, pt became shortness of breath, coughing and anxious.  RA sats remained 100%.  Some wheezing noted.  Pt given 5 mg albuterol in route.  Vitals 12078, hr 120, resp 24, 100% Neb

## 2015-04-29 NOTE — Progress Notes (Signed)
Pt in with respiratory issues Reports leaving pcp office who ordered nebulizer via [facility] and nebulizer solution via [facility] both not arriving until tomorrow Cm discussed nebulizer can [facility] on [address] prior to 5 pm and EDP can offer nebulizer solution Rx (a few) until she gets her supply from [facility] as ordered by pcp  Female family at bedside to assist with pick up of nebulizer Discussed nebulizer a small item that may not be delivered  1605 left a message for [facility] DME coordinator about assist with nebulizer since pt found out from her dr that her nebulizer is not at the dme store  1612 Cm spoke with staff at [phone] to confirm it is best to get the nebulizer at the store and request ED Cm fax her copy of the order to [phone] and ask the family to pick up before 5 pm

## 2015-04-29 NOTE — Patient Instructions (Addendum)
It is nice to meet you today. I am sorry you are not feeling well. We will do a nebulizer treatment today. Your oxygen saturations are good today, 99% and 100%. We will treat you with another Prednisone taper; 10 mg tablets: 4 tabs x 3 days, 3 tabs x 3 days, 2 tabs x 3 days 1 tab x 3 days then stop. We will give you a depo medrol injection today. We will start the process to get you a nedulizer machine. We will send an order for albuterol nebulized medication every 6 hours as needed for shortness of breath or wheezing. Continue your medications for acid reflux per Dr. [facility] Caller.  Hydromet cough syrup 5 cc's every 6 hours for cough. Do not drive if you are taking the cough syrup as it can make you sleepy. Continue your Dulera 2 puffs in the morning and at night. Rinse after use Continue your rescue inhaler every 4-6 hours as needed for shortness of breath or wheezing. Mucinex twice daily for cough and congestion Sugarless candy for your cough. Fluids and rest. CXR today on your way out. We will call with results. CBC and BMET and D-Dimer today on your way out. We will call you with results. Follow up with Dr. [name] this afternoon as planned. Follow up with Dr. [facility] Caller or Me in 1 week. Please contact office for sooner follow up if symptoms do not improve or worsen or seek emergency care

## 2015-04-29 NOTE — Telephone Encounter (Signed)
Called and spoke with  Melissa Rowe at [facility]. She states that due to medicare the albuterol needs a new sig due to the PRN. Verified the new sig as Take every 6 hours for SOB and wheezing and as needed. She voiced understanding and had no further questions. Nothing further needed.

## 2015-04-29 NOTE — Discharge Instructions (Signed)
As discussed, with today's evaluation for asthma exacerbation, it is important that you monitor your condition carefully. For the next 48 hours, please use albuterol every 4 hours in addition to the prescribed steroids. Please be sure to follow-up with her pulmonologist. Return for concerning changes in your condition.   Asthma, Acute Bronchospasm Acute bronchospasm caused by asthma is also referred to as an asthma attack. Bronchospasm means your air passages become narrowed. The narrowing is caused by inflammation and tightening of the muscles in the air tubes (bronchi) in your lungs. This can make it hard to breathe or cause you to wheeze and cough. CAUSES Possible triggers are:  Animal dander from the skin, hair, or feathers of animals.  Dust mites contained in house dust.  Cockroaches.  Pollen from trees or grass.  Mold.  Cigarette or tobacco smoke.  Air pollutants such as dust, household cleaners, hair sprays, aerosol sprays, paint fumes, strong chemicals, or strong odors.  Cold air or weather changes. Cold air may trigger inflammation. Winds increase molds and pollens in the air.  Strong emotions such as crying or laughing hard.  Stress.  Certain medicines such as aspirin or beta-blockers.  Sulfites in foods and drinks, such as dried fruits and wine.  Infections or inflammatory conditions, such as a flu, cold, or inflammation of the nasal membranes (rhinitis).  Gastroesophageal reflux disease (GERD). GERD is a condition where stomach acid backs up into your esophagus.  Exercise or strenuous activity. SIGNS AND SYMPTOMS   Wheezing.  Excessive coughing, particularly at night.  Chest tightness.  Shortness of breath. DIAGNOSIS  Your health care provider will ask you about your medical history and perform a physical exam. A chest X-ray or blood testing may be performed to look for other causes of your symptoms or other conditions that may have triggered your asthma  attack. TREATMENT  Treatment is aimed at reducing inflammation and opening up the airways in your lungs. Most asthma attacks are treated with inhaled medicines. These include quick relief or rescue medicines (such as bronchodilators) and controller medicines (such as inhaled corticosteroids). These medicines are sometimes given through an inhaler or a nebulizer. Systemic steroid medicine taken by mouth or given through an IV tube also can be used to reduce the inflammation when an attack is moderate or severe. Antibiotic medicines are only used if a bacterial infection is present.  HOME CARE INSTRUCTIONS   Rest.  Drink plenty of liquids. This helps the mucus to remain thin and be easily coughed up. Only use caffeine in moderation and do not use alcohol until you have recovered from your illness.  Do not smoke. Avoid being exposed to secondhand smoke.  You play a critical role in keeping yourself in good health. Avoid exposure to things that cause you to wheeze or to have breathing problems.  Keep your medicines up-to-date and available. Carefully follow your health care provider's treatment plan.  Take your medicine exactly as prescribed.  When pollen or pollution is bad, keep windows closed and use an air conditioner or go to places with air conditioning.  Asthma requires careful medical care. See your health care provider for a follow-up as advised. If you are more than [redacted] weeks pregnant and you were prescribed any new medicines, let your obstetrician know about the visit and how you are doing. Follow up with your health care provider as directed.  After you have recovered from your asthma attack, make an appointment with your outpatient doctor to talk about ways  to reduce the likelihood of future attacks. If you do not have a doctor who manages your asthma, make an appointment with a primary care doctor to discuss your asthma. SEEK IMMEDIATE MEDICAL CARE IF:   You are getting  worse.  You have trouble breathing. If severe, call your local emergency services (911 in the [place]  You develop chest pain or discomfort.  You are vomiting.  You are not able to keep fluids down.  You are coughing up yellow, green, brown, or bloody sputum.  You have a fever and your symptoms suddenly get worse.  You have trouble swallowing. MAKE SURE YOU:   Understand these instructions.  Will watch your condition.  Will get help right away if you are not doing well or get worse.   This information is not intended to replace advice given to you by your health care provider. Make sure you discuss any questions you have with your health care provider.   Document Released: 05/16/2006 Document Revised: 02/03/2013 Document Reviewed: 08/06/2012 Elsevier Interactive Patient Education [employer].

## 2015-04-29 NOTE — ED Provider Notes (Signed)
CSN: [id number]     Arrival date & time 04/29/15  1208 History   First MD Initiated Contact with Patient 04/29/15 1237     Chief Complaint  Patient presents with  . Asthma  . Shortness of Breath   HPI  Patient presents from her 18 office due to concerns of respiratory distress per Patient has recent episode of pneumonia, went to a physician today for follow-up care. Patient notes persistent mild dyspnea since discharge, as well as persistent anxiety. Patient has history of asthma. She states that symptoms are persistent with no relief from anything, including home albuterol. She denies new looks swelling, no fever, vomiting, no chills. No new chest pain, belly pain.   Past Medical History  Diagnosis Date  . Dyspnea   . Gastroenteritis   . Hyperlipidemia   . Diverticulitis   . Atrial mass   . Hypothyroidism   . Asthma   . Urgency of urination   . Frequency of urination   . GERD (gastroesophageal reflux disease)   . Arthritis   . Environmental allergies   . Obesity   . OSA (obstructive sleep apnea)     wears CPAP night pt does not know settings  . Hemorrhoids     external with some bleeding  . Anemia sept /oct 2014  . Glaucoma   . Constipation   . PONV (postoperative nausea and vomiting)     states she gets sick on her stomach after surgery  . Neuropathy [facility])    Past Surgical History  Procedure Laterality Date  . Cholecystectomy  2008  . Polyps removal  2007  . Tubal ligation  1992  . Carpal tunnel release      right  . Colonoscopy    . Knee arthroscopy  03/18/2012    Procedure: ARTHROSCOPY KNEE;  Surgeon: Mcarthur Rossetti, MD;  Location: [facility];  Service: Orthopedics;  Laterality: Right;  Right knee arthroscopy with debridement and three compartment chondroplasty  . Colonoscopy with propofol N/A 02/24/2013    Procedure: COLONOSCOPY WITH PROPOFOL;  Surgeon: Ladene Artist, MD;  Location: [facility];  Service: Endoscopy;  Laterality: N/A;  . Breast  reduction surgery Bilateral 12/16/2013    Procedure: BILATERAL BREAST REDUCTION;  Surgeon: Theodoro Kos, DO;  Location: [facility];  Service: Plastics;  Laterality: Bilateral;  . Carpal tunnel release Right   . Breast reduction surgery Bilateral   . Cholecystectomy    . Tubal ligaton     Family History  Problem Relation Age of Onset  . Heart murmur Mother     rheumatoid arthritis  . Heart attack Mother   . COPD Mother   . Diabetes Mother   . Asthma Brother     and mother both as a child  . Prostate cancer Father   . Kidney failure Father    Social History  Substance Use Topics  . Smoking status: Former Smoker -- 2.00 packs/day for 7 years    Types: Cigarettes    Quit date: 02/12/1990  . Smokeless tobacco: Never Used  . Alcohol Use: Yes     Comment: rare   OB History    No data available     Review of Systems  Constitutional:       Per HPI, otherwise negative  HENT:       Per HPI, otherwise negative  Respiratory:       Per HPI, otherwise negative  Cardiovascular:       Per HPI, otherwise negative  Gastrointestinal:  Negative for vomiting.  Endocrine:       Negative aside from HPI  Genitourinary:       Neg aside from HPI   Musculoskeletal:       Per HPI, otherwise negative  Skin: Negative.   Neurological: Negative for syncope.      Allergies  Lamictal  Home Medications   Prior to Admission medications   Medication Sig Start Date End Date Taking? Authorizing Provider  albuterol (PROVENTIL HFA;VENTOLIN HFA) 108 (90 BASE) MCG/ACT inhaler Inhale 2 puffs into the lungs every 6 (six) hours as needed. For shortness of breath. 12/30/14  Yes Brand Males, MD  calcium-vitamin D (OSCAL WITH D) 500-200 MG-UNIT per tablet Take 1 tablet by mouth daily.    Yes Historical Provider, MD  cyclobenzaprine (FLEXERIL) 10 MG tablet Take 1 tablet (10 mg total) by mouth 2 (two) times daily as needed for muscle spasms. 09/22/13  Yes [name], NP  docusate sodium (COLACE)  250 MG capsule Take 250 mg by mouth daily as needed for constipation.  12/11/13  Yes Historical Provider, MD  ferrous sulfate 325 (65 FE) MG tablet Take 325 mg by mouth daily with breakfast.   Yes Historical Provider, MD  furosemide (LASIX) 40 MG tablet Take 40 mg by mouth daily.  02/18/14  Yes Historical Provider, MD  gabapentin (NEURONTIN) 800 MG tablet Take 1 tablet (800 mg total) by mouth 4 (four) times daily. 10/08/14  Yes [name], MD  levothyroxine (SYNTHROID, LEVOTHROID) 100 MCG tablet Take 100 mcg by mouth daily before breakfast. Reported on 02/16/2015   Yes Historical Provider, MD  lidocaine (XYLOCAINE) 5 % ointment Dispense 2 50 g tubes  Apply to right thigh twice a day 02/26/14  Yes [name], MD  mometasone-formoterol (DULERA) 200-5 MCG/ACT AERO Inhale 2 puffs into the lungs 2 (two) times daily. 12/30/14  Yes Brand Males, MD  montelukast (SINGULAIR) 10 MG tablet Take 10 mg by mouth daily with breakfast. 05/20/12  Yes Brand Males, MD  Multiple Vitamin (MULTIVITAMIN) capsule Take 1 capsule by mouth daily.    Yes Historical Provider, MD  naproxen (NAPROSYN) 500 MG tablet Take 500 mg by mouth 2 (two) times daily as needed for mild pain. Reported on 02/16/2015   Yes Historical Provider, MD  Omeprazole-Sodium Bicarbonate (ZEGERID OTC PO) Take 1 tablet by mouth daily.   Yes Historical Provider, MD  OXcarbazepine (TRILEPTAL) 300 MG tablet Take 1 po in am and 2 at night Patient taking differently: Take 300 mg by mouth 4 (four) times daily.  02/16/15  Yes [name], MD  polyethylene glycol (MIRALAX / GLYCOLAX) packet Take 17 g by mouth 2 (two) times daily.    Yes Historical Provider, MD  spironolactone (ALDACTONE) 25 MG tablet Take 25 mg by mouth daily.   Yes Historical Provider, MD  traMADol (ULTRAM) 50 MG tablet Take 50 mg by mouth 3 (three) times daily as needed for moderate pain.   Yes Historical Provider, MD  albuterol (PROVENTIL) (2.5 MG/3ML) 0.083% nebulizer solution Take 3  mLs (2.5 mg total) by nebulization every 6 (six) hours as needed for wheezing or shortness of breath. 04/29/15   Magdalen Spatz, NP  HYDROcodone-homatropine [facility]) 5-1.5 MG/5ML syrup Take 5 mLs by mouth every 6 (six) hours as needed for cough. 04/29/15   Magdalen Spatz, NP  predniSONE (DELTASONE) 20 MG tablet Take 2 tablets (40 mg total) by mouth daily with breakfast. For the next four days 04/29/15   Carmin Muskrat,  MD   BP 137/77 mmHg  Pulse 122  Temp(Src) 98.3 F (36.8 C) (Oral)  Resp 27  SpO2 100%  LMP 04/06/2015 Physical Exam  Constitutional: She is oriented to person, place, and time. She appears well-developed and well-nourished. She appears distressed.  HENT:  Head: Normocephalic and atraumatic.  Eyes: Conjunctivae and EOM are normal.  Cardiovascular: Normal rate and regular rhythm.   Pulmonary/Chest: Accessory muscle usage present. No stridor. Tachypnea noted. She is in respiratory distress. She has decreased breath sounds. She has wheezes.  Abdominal: She exhibits no distension.  Musculoskeletal: She exhibits no edema.  Neurological: She is alert and oriented to person, place, and time. No cranial nerve deficit.  Skin: Skin is warm. She is diaphoretic.  Psychiatric: She has a normal mood and affect.  Nursing note and vitals reviewed.   ED Course  Procedures (including critical care time) Labs Review Labs Reviewed  CBC WITH DIFFERENTIAL/PLATELET - Abnormal; Notable for the following:    WBC 12.6 (*)    RBC 6.26 (*)    MCV 62.9 (*)    MCH 20.9 (*)    RDW 17.8 (*)    Neutro Abs 8.0 (*)    All other components within normal limits  BASIC METABOLIC PANEL    Imaging Review Dg Chest 2 View  04/29/2015  CLINICAL DATA:  Cough, shortness of breath and pneumonia. EXAM: CHEST - 2 VIEW COMPARISON:  04/17/2015 FINDINGS: The heart size and mediastinal contours are within normal limits. Aeration of both lungs has improved since the prior study. There may be some residual scarring  versus subtle infiltrate remaining in the right lower lung. No edema or pleural fluid identified. The visualized skeletal structures are unremarkable. IMPRESSION: Improved aeration bilaterally. Some residual scarring versus subtle infiltrate remains in the right lower lung. Electronically Signed   By: Aletta Edouard M.D.   On: 04/29/2015 13:38   Dg Chest Port 1 View  04/29/2015  CLINICAL DATA:  Shortness of breath and asthma EXAM: PORTABLE CHEST 1 VIEW COMPARISON:  04/29/2015 FINDINGS: Cardiac shadow is again at the upper limits of normal in size. The lungs are well aerated bilaterally. No focal infiltrate or sizable effusion is seen. IMPRESSION: No acute abnormality noted. Electronically Signed   By: Inez Catalina M.D.   On: 04/29/2015 14:01   I have personally reviewed and evaluated these images and lab results as part of my medical decision-making.  Update: Following initial treatment, the patient was tachypneic, dyspneic.  Update:, Patient receiving continuous nebulizer session.      3:47 PM Patient substantially better.   MDM   Final diagnoses:  Asthma exacerbation   Patient presents in respiratory distress. Patient's history of asthma, recent pneumonia. Here, the patient is afebrile, awake, alert. Patient requires multiple breathing treatments, including continuous albuterol therapy, as well as steroids for control of her condition. Symptoms well controlled after several hours. We discussed patient's case with case management to ensure that she has proper home health equipment, medication.  With her improvement, she is discharged in stable condition to follow-up with primary care.   Carmin Muskrat, MD 04/29/15 [phone]

## 2015-04-29 NOTE — ED Notes (Signed)
Bed: [id number] Expected date:  Expected time:  Means of arrival:  Comments: EMS/74/wound

## 2015-04-29 NOTE — Assessment & Plan Note (Addendum)
Continued Wheezing and cough despite recent hospital admission and  prednisone taper ( completed 04/28/15) and treatment with Levaquin( completed 04/27/15).Secretions are clear per her history.  Plan: Xopenex Nebulizer treatment in office now. Depo Medrol 80 mg. Injection in office now. We will treat you with another Prednisone taper; 10 mg tablets: 4 tabs x 3 days, 3 tabs x 3 days, 2 tabs x 3 days 1 tab x 3 days then stop. We will order a nebulizer machine for you through [facility] We will send an order for albuterol nebulized medication every 6 hours as needed for shortness of breath or wheezing. Continue your medications for acid reflux per Dr. [facility] Caller.  Hydromet cough syrup 5 cc's every 6 hours for cough.No [place] or [place] Do not drive if you are taking the cough syrup as it can make you sleepy. Continue your Dulera 2 puffs in the morning and at night. Rinse after use Mucinex twice daily for cough and congestion Sugarless candy for your cough. Fluids and rest CXR today on your way out. We will call with results. Labwork today on your way out.We will call you with results. Follow up with Dr. [name] this afternoon as planned. Follow up with Dr. [facility] Caller or myself in 1 week. Please contact office for sooner follow up if symptoms do not improve or worsen or seek emergency care

## 2015-04-29 NOTE — Progress Notes (Addendum)
Subjective:    Patient ID: Melissa Rowe, female    DOB: 22-May-1965, 50 y.o.   MRN: [id number]  HPI  50 year old female former smoker with past medical history of moderate persistent asthma, obstructive sleep apnea, and morbid obesity, maintained on Dulera and Singulair with albuterol rescue inhaler.She wears CPAP nightly.States she is compliant.   Significant Events/ Procedures:   Hospitalization 04/17/15-04/20/15 at [facility] for acute asthma exacerbation, and possible basal pneumonia per chest x-ray.  04/17/15: CXR CHEST 2 VIEW  COMPARISON: 03/13/2012  FINDINGS: Cardiac silhouette mildly enlarged in the interval since prior study. There is central venous congestion. Study is limited by body habitus, with no abnormal opacities on the frontal view. The lateral view suggests infiltrate posteriorly.  IMPRESSION: 1. Interval development of mild cardiac enlargement with venous congestion. 2. Infiltrate suggested posteriorly and inferiorly on the lateral view. Pneumonia not excluded. No definite evidence of edema.  04/18/15: Echocardiogram W Colorflow Spectral Doppler  Ejection fraction is visually estimated at 60-65%  Normal left ventricular size and systolic function with no appreciable  segmental abnormality.   04/29/15: CXR: Chest 2 View   FINDINGS: The heart size and mediastinal contours are within normal limits. Aeration of both lungs has improved since the prior study. There may be some residual scarring versus subtle infiltrate remaining in the right lower lung. No edema or pleural fluid identified. The visualized skeletal structures are unremarkable.  04/29/2015: D-dimer-0.31uG/mL-FEU  04/29/15: Hospital Follow up:  Patient presents to the office today in acute distress with inspiratory and expiratory wheezing throughoutUpper and lower lung fields and cough. Cough is productive for copious amounts of thick white sputum. Oxygen saturations are  99% on room air. She was given a Xopenex nebulized treatment while in the exam room. Additionally she was given Depo-Medrol 80 mg while in the exam room. She states that she has not improved since being discharged from the hospital. She feels she is worse today. She completed her Levaquin 04/27/2015 and she completed her prednisone taper 04/28/2015. Her original plan was to try and keep her out of the hospital, with another prednisone taper. We were in the process of obtaining a chest x-ray and lab work when the patient became dizzy. She required a wheelchair for transportation to the lab and radiology. She continued to cough and wheeze extensively, however maintained her saturations throughout at greater than 97%.  She was tachycardic and becoming increasingly distressed and therefore the decision was made have 911 called and have the patient transported to the emergency room for further evaluation and care. With recent hospitalization and decrease in activity there is concern for pulmonary embolism. D-dimer was drawn in the lab prior to sending the patient to the ED. She was accompanied by her sister-in-law. I have called and spoken with the nursing staff in the emergency room with an update on the patient regarding care and diagnostics she has received here. Current outpatient prescriptions:  .  albuterol (PROVENTIL HFA;VENTOLIN HFA) 108 (90 BASE) MCG/ACT inhaler, Inhale 2 puffs into the lungs every 6 (six) hours as needed. For shortness of breath., Disp: 1 Inhaler, Rfl: 3 .  calcium-vitamin D (OSCAL WITH D) 500-200 MG-UNIT per tablet, Take 1 tablet by mouth daily. , Disp: , Rfl:  .  cyclobenzaprine (FLEXERIL) 10 MG tablet, Take 1 tablet (10 mg total) by mouth 2 (two) times daily as needed for muscle spasms., Disp: 20 tablet, Rfl: 0 .  docusate sodium (COLACE) 250 MG capsule, Take 250 mg by  mouth daily as needed for constipation. , Disp: , Rfl:  .  ferrous sulfate 325 (65 FE) MG tablet, Take 325 mg by mouth  daily with breakfast., Disp: , Rfl:  .  furosemide (LASIX) 40 MG tablet, Take 40 mg by mouth daily. , Disp: , Rfl:  .  gabapentin (NEURONTIN) 800 MG tablet, Take 1 tablet (800 mg total) by mouth 4 (four) times daily., Disp: 120 tablet, Rfl: 5 .  levothyroxine (SYNTHROID, LEVOTHROID) 100 MCG tablet, Take 100 mcg by mouth daily before breakfast. Reported on 02/16/2015, Disp: , Rfl:  .  lidocaine (XYLOCAINE) 5 % ointment, Dispense 2 50 g tubes  Apply to right thigh twice a day, Disp: 100 g, Rfl: 3 .  mometasone-formoterol (DULERA) 200-5 MCG/ACT AERO, Inhale 2 puffs into the lungs 2 (two) times daily., Disp: 1 Inhaler, Rfl: 3 .  montelukast (SINGULAIR) 10 MG tablet, Take 10 mg by mouth daily with breakfast., Disp: , Rfl:  .  Multiple Vitamin (MULTIVITAMIN) capsule, Take 1 capsule by mouth daily. , Disp: , Rfl:  .  naproxen (NAPROSYN) 500 MG tablet, Take 500 mg by mouth 2 (two) times daily as needed for mild pain. Reported on 02/16/2015, Disp: , Rfl:  .  Omeprazole-Sodium Bicarbonate (ZEGERID OTC PO), Take 1 tablet by mouth daily., Disp: , Rfl:  .  OXcarbazepine (TRILEPTAL) 300 MG tablet, Take 1 po in am and 2 at night (Patient taking differently: Take 300 mg by mouth 4 (four) times daily. ), Disp: 90 tablet, Rfl: 11 .  polyethylene glycol (MIRALAX / GLYCOLAX) packet, Take 17 g by mouth 2 (two) times daily. , Disp: , Rfl:  .  spironolactone (ALDACTONE) 25 MG tablet, Take 25 mg by mouth daily., Disp: , Rfl:  .  traMADol (ULTRAM) 50 MG tablet, Take 50 mg by mouth 3 (three) times daily as needed for moderate pain., Disp: , Rfl:  .  albuterol (PROVENTIL) (2.5 MG/3ML) 0.083% nebulizer solution, Take 3 mLs (2.5 mg total) by nebulization every 6 (six) hours as needed for wheezing or shortness of breath., Disp: 75 mL, Rfl: 1 .  HYDROcodone-homatropine (HYCODAN) 5-1.5 MG/5ML syrup, Take 5 mLs by mouth every 6 (six) hours as needed for cough., Disp: 120 mL, Rfl: 0 .  predniSONE (DELTASONE) 10 MG tablet, TAKE 40MG  X3  DAYS, TAKE 30MG  X3 DAYS, TAKE 20MG  X3 DAYS, TAKE 10MG  X3 DAYS. STOP., Disp: 30 tablet, Rfl: 0   Past Medical History  Diagnosis Date  . Dyspnea   . Gastroenteritis   . Hyperlipidemia   . Diverticulitis   . Atrial mass   . Hypothyroidism   . Asthma   . Urgency of urination   . Frequency of urination   . GERD (gastroesophageal reflux disease)   . Arthritis   . Environmental allergies   . Obesity   . OSA (obstructive sleep apnea)     wears CPAP night pt does not know settings  . Hemorrhoids     external with some bleeding  . Anemia sept /oct 2014  . Glaucoma   . Constipation   . PONV (postoperative nausea and vomiting)     states she gets sick on her stomach after surgery  . Neuropathy (HCC)     Allergies  Allergen Reactions  . Lamictal [Lamotrigine] Other (See Comments)    headache     Review of Systems Constitutional:   No  weight loss, night sweats,  Fevers, chills, fatigue, or  lassitude.  HEENT:   No headaches,  Difficulty swallowing,  Tooth/dental problems, or  Sore throat,                No sneezing, itching, ear ache, nasal congestion, post nasal drip,   CV:  No chest pain,  Orthopnea, PND, swelling in lower extremities, anasarca, + dizziness, no palpitations,no syncope.   GI  No heartburn, indigestion, abdominal pain, nausea, vomiting, diarrhea, change in bowel habits, loss of appetite, bloody stools.   Resp: + shortness of breath with exertion and at rest.  + excess mucus, +productive cough,  No non-productive cough,  No coughing up of blood.  No change in color of mucus.  + wheezing.  No chest wall deformity  Skin: no rash or lesions.  GU: no dysuria, change in color of urine, no urgency or frequency.  No flank pain, no hematuria   MS:  No joint pain or swelling.  No decreased range of motion.  No back pain.  Psych:  No change in mood or affect. No depression or anxiety.  No memory loss.        Objective:   Physical Exam  BP 122/72 mmHg  Pulse  110  Temp(Src) 98.2 F (36.8 C) (Oral)  Ht 5\' 6"  (1.676 m)  Wt 371 lb 12.8 oz (168.647 kg)  BMI 60.04 kg/m2  SpO2 99%  LMP 04/06/2015 Physical Exam:  General- Distressed,  A&Ox3, audible Inspiratory and Expiratory Wheezing, cough, anxious ENT: No sinus tenderness, TM clear, pale nasal mucosa, no oral exudate,no post nasal drip, no LAN Cardiac: S1, S2, regular rate and rhythm, no murmur Chest: + Exp. wheeze/ no rales/ dullness; + accessory muscle use, no nasal flaring, no sternal retractions Abd.: Soft Non-tender Ext: No clubbing cyanosis, edema Neuro:  Weak, requiring wheel chair Skin: No rashes, warm and dry Psych: anxious mood and behavior  Magdalen Spatz, AGACNP-BC [place] Pager # [phone] 04/29/15     Assessment & Plan:

## 2015-05-11 ENCOUNTER — Encounter: Payer: Self-pay | Admitting: Acute Care

## 2015-05-11 ENCOUNTER — Ambulatory Visit (INDEPENDENT_AMBULATORY_CARE_PROVIDER_SITE_OTHER): Payer: Medicare Other | Admitting: Acute Care

## 2015-05-11 VITALS — BP 132/88 | HR 107 | Temp 98.6°F | Ht 66.0 in | Wt 375.0 lb

## 2015-05-11 DIAGNOSIS — J4541 Moderate persistent asthma with (acute) exacerbation: Secondary | ICD-10-CM | POA: Diagnosis not present

## 2015-05-11 DIAGNOSIS — G4733 Obstructive sleep apnea (adult) (pediatric): Secondary | ICD-10-CM

## 2015-05-11 NOTE — Progress Notes (Signed)
Subjective:    Patient ID: Melissa Rowe, female    DOB: February 23, 1965, 49 y.o.   MRN: [id number]  HPI  50 year old female former smoker with past medical history of moderate persistent asthma, obstructive sleep apnea, and morbid obesity, maintained on Dulera and Singulair with albuterol rescue inhaler.She wears CPAP nightly.States she is compliant.   Significant Events/ Procedures:  Hospitalization 04/17/15-04/20/15 at [facility] for acute asthma exacerbation, and possible basal pneumonia per chest x-ray.   04/29/15:  [facility] Visit:   for respiratory distress requiring continuous albuterol therapy and steroids. D/C'd home with prednisone taper.  EXAM: PORTABLE CHEST 1 VIEW  COMPARISON: 04/29/2015  FINDINGS: Cardiac shadow is again at the upper limits of normal in size. The lungs are well aerated bilaterally. No focal infiltrate or sizable effusion is seen.  IMPRESSION: No acute abnormality noted.  05/11/15: Follow up Office Visit: Patient presents to the office today with resolution of asthmatic flare. She states she has no shortness of breath, no fever, no chest pain, no hemoptysis,no orthopnea. She states her breathing is better. Her only complaint is that her nebulizer machine that was ordered on 04/29/2015 was not [facility]. She was able to borrow nebulizer machine from her friend and has been using the nebulized albuterol as needed for shortness of breath. She states she has not needed to use the nebulized medication for several days as she is better.   Current outpatient prescriptions:  .  albuterol (PROVENTIL) (2.5 MG/3ML) 0.083% nebulizer solution, Take 3 mLs (2.5 mg total) by nebulization every 6 (six) hours as needed for shortness of breath., Disp: 75 mL, Rfl: 1 .  calcium-vitamin D (OSCAL WITH D) 500-200 MG-UNIT per tablet, Take 1 tablet by mouth daily. , Disp: , Rfl:  .  cyclobenzaprine (FLEXERIL) 10 MG tablet, Take 1 tablet  (10 mg total) by mouth 2 (two) times daily as needed for muscle spasms., Disp: 20 tablet, Rfl: 0 .  docusate sodium (COLACE) 250 MG capsule, Take 250 mg by mouth daily as needed for constipation. , Disp: , Rfl:  .  ferrous sulfate 325 (65 FE) MG tablet, Take 325 mg by mouth daily with breakfast., Disp: , Rfl:  .  furosemide (LASIX) 40 MG tablet, Take 40 mg by mouth daily. , Disp: , Rfl:  .  gabapentin (NEURONTIN) 800 MG tablet, Take 1 tablet (800 mg total) by mouth 4 (four) times daily., Disp: 120 tablet, Rfl: 5 .  HYDROcodone-homatropine (HYCODAN) 5-1.5 MG/5ML syrup, Take 5 mLs by mouth every 6 (six) hours as needed for cough., Disp: 120 mL, Rfl: 0 .  levothyroxine (SYNTHROID, LEVOTHROID) 100 MCG tablet, Take 100 mcg by mouth daily before breakfast. Reported on 02/16/2015, Disp: , Rfl:  .  lidocaine (XYLOCAINE) 5 % ointment, Dispense 2 50 g tubes  Apply to right thigh twice a day, Disp: 100 g, Rfl: 3 .  mometasone-formoterol (DULERA) 200-5 MCG/ACT AERO, Inhale 2 puffs into the lungs 2 (two) times daily., Disp: 1 Inhaler, Rfl: 3 .  montelukast (SINGULAIR) 10 MG tablet, Take 10 mg by mouth daily with breakfast., Disp: , Rfl:  .  Multiple Vitamin (MULTIVITAMIN) capsule, Take 1 capsule by mouth daily. , Disp: , Rfl:  .  naproxen (NAPROSYN) 500 MG tablet, Take 500 mg by mouth 2 (two) times daily as needed for mild pain. Reported on 02/16/2015, Disp: , Rfl:  .  Omeprazole-Sodium Bicarbonate (ZEGERID OTC PO), Take 1 tablet by mouth daily., Disp: , Rfl:  .  OXcarbazepine (TRILEPTAL) 300 MG tablet, Take 1 po in am and 2 at night (Patient taking differently: Take 300 mg by mouth 4 (four) times daily. ), Disp: 90 tablet, Rfl: 11 .  polyethylene glycol (MIRALAX / GLYCOLAX) packet, Take 17 g by mouth 2 (two) times daily. , Disp: , Rfl:  .  predniSONE (DELTASONE) 20 MG tablet, Take 2 tablets (40 mg total) by mouth daily with breakfast. For the next four days, Disp: 8 tablet, Rfl: 0 .  spironolactone (ALDACTONE) 25 MG  tablet, Take 25 mg by mouth daily., Disp: , Rfl:  .  traMADol (ULTRAM) 50 MG tablet, Take 50 mg by mouth 3 (three) times daily as needed for moderate pain., Disp: , Rfl:    Past Medical History  Diagnosis Date  . Dyspnea   . Gastroenteritis   . Hyperlipidemia   . Diverticulitis   . Atrial mass   . Hypothyroidism   . Asthma   . Urgency of urination   . Frequency of urination   . GERD (gastroesophageal reflux disease)   . Arthritis   . Environmental allergies   . Obesity   . OSA (obstructive sleep apnea)     wears CPAP night pt does not know settings  . Hemorrhoids     external with some bleeding  . Anemia sept /oct 2014  . Glaucoma   . Constipation   . PONV (postoperative nausea and vomiting)     states she gets sick on her stomach after surgery  . Neuropathy (HCC)     Allergies  Allergen Reactions  . Lamictal [Lamotrigine] Other (See Comments)    headache     Review of Systems Constitutional:   No  weight loss, night sweats,  Fevers, chills, fatigue, or  lassitude.  HEENT:   No headaches,  Difficulty swallowing,  Tooth/dental problems, or  Sore throat,                No sneezing, itching, ear ache, nasal congestion, post nasal drip,   CV:  No chest pain,  Orthopnea, PND, swelling in lower extremities, anasarca, dizziness, palpitations, syncope.   GI  No heartburn, indigestion, abdominal pain, nausea, vomiting, diarrhea, change in bowel habits, loss of appetite, bloody stools.   Resp: Baseline shortness of breath with exertion not at rest.  No excess mucus, no productive cough,  + non-productive cough,  No coughing up of blood.  No change in color of mucus.  No wheezing.  No chest wall deformity  Skin: no rash or lesions.  GU: no dysuria, change in color of urine, no urgency or frequency.  No flank pain, no hematuria   MS:  No joint pain or swelling.  No decreased range of motion.  No back pain.  Psych:  No change in mood or affect. No depression or anxiety.  No  memory loss.        Objective:   Physical Exam  BP 132/88 mmHg  Pulse 107  Temp(Src) 98.6 F (37 C) (Oral)  Ht 5\' 6"  (1.676 m)  Wt 375 lb (170.099 kg)  BMI 60.56 kg/m2  SpO2 98%  LMP 04/06/2015   Physical Exam:  General- No distress,  A&Ox3 ENT: No sinus tenderness, TM clear, pale nasal mucosa, no oral exudate,no post nasal drip, no LAN Cardiac: S1, S2, regular rate and rhythm, no murmur Chest: No wheeze/ rales/ dullness; no accessory muscle use, no nasal flaring, no sternal retractions Abd.: Soft Non-tender Ext: No clubbing cyanosis, edema Neuro:  normal strength  Skin: No rashes, warm and dry Psych: normal mood and behavior  Magdalen Spatz, AGACNP-BC [place] Pager # [phone] 05/11/2015     Assessment & Plan:

## 2015-05-11 NOTE — Assessment & Plan Note (Signed)
Resolution of asthmatic flare: Plan: Finish your prednisone taper until finished. Continue you Dulera 2 puffs in the morning and 2 puffs in the evening. Use you albuterol neb treatments every 6 hours as needed for shortness of breath Continue your Singulair daily. Continue using the hycodan cough syrup every 6 hours as needed for cough . Do not drive if sleepy. We will call [facility] to try to get your nebulizer machine delivered. Follow up with Dr. Chase Caller at next available appointment. Please contact office for sooner follow up if symptoms do not improve or worsen or seek emergency care

## 2015-05-11 NOTE — Patient Instructions (Addendum)
Its good to see you again. I am glad you are feeling so much better. Finish your prednisone taper until finished. Continue you Dulera 2 puffs in the morning and 2 puffs in the evening. Use you albuterol neb treatments every 6 hours as needed for shortness of breath Continue your Singulair daily. Continue using the hycodan cough syrup every 6 hours as needed for cough . Do not drive if sleepy. We will call [facility] to try to get your nebulizer machine delivered. Follow up with Dr. Chase Caller at next available appointment. Please contact office for sooner follow up if symptoms do not improve or worsen or seek emergency care

## 2015-05-11 NOTE — Assessment & Plan Note (Signed)
Reports compliance and benefit from CPAP nightly. Plan: Continue on CPAP at bedtime. You appear to be benefiting from the treatment Goal is to wear for at least 4-6 hours each night for maximal clinical benefit. Continue to work on weight loss, as the link between excess weight  and sleep apnea is well established.  Do not drive if sleepy.

## 2015-05-19 ENCOUNTER — Other Ambulatory Visit: Payer: Self-pay | Admitting: *Deleted

## 2015-06-15 ENCOUNTER — Encounter: Payer: Self-pay | Admitting: Neurology

## 2015-06-15 ENCOUNTER — Ambulatory Visit (INDEPENDENT_AMBULATORY_CARE_PROVIDER_SITE_OTHER): Payer: Medicare Other | Admitting: Neurology

## 2015-06-15 VITALS — BP 138/76 | HR 88 | Resp 22 | Ht 66.0 in | Wt 385.0 lb

## 2015-06-15 DIAGNOSIS — G5711 Meralgia paresthetica, right lower limb: Secondary | ICD-10-CM | POA: Diagnosis not present

## 2015-06-15 DIAGNOSIS — R208 Other disturbances of skin sensation: Secondary | ICD-10-CM | POA: Diagnosis not present

## 2015-06-15 NOTE — Progress Notes (Signed)
[facility]  PATIENT: Melissa Rowe DOB: Mar 02, 1965   REFERRING CLINICIAN: Dr. [name] HISTORY FROM: Patient  REASON FOR VISIT: Lateral thigh pain   HISTORICAL  CHIEF COMPLAINT:  Chief Complaint  Patient presents with  . Meralgia Paraesthetica    Sts. numbness, pain right thigh is some better since increasing Oxcarbazepine.  She is now taking 600mg  bid.  She now c/o active dreams--talking in her sleep, and wakes with legs moving as if she is running.  Not sure if this is related to Oxcarbazepine.  10 lb. wt. gain since last ov.  Has seen [facility]. they were checking with insurance for approval for a procedure, but then she never heard back from them.  Sts. she will call them in the morning/fim    HISTORY OF PRESENT ILLNESS:  Melissa Rowe is a 50 yo woman with right anterolateral thigh pain.   She reports that the pain is better on oxcarbazapine 600 mg po bid.    However, she sometimes feels lightheaded when she stands up.    The lightheadedness just started last month but she has been on the higher dose of Oxcarbazepine for a few months.  She is also on gabapentin 800 mg po qid and tolerates it well.  Lidocaine ointment also has helped some.    She stopped the lamotrigine due to feeling dizzy and having headaches.  Lyrica caused significant further weight gain.   She notes active dreams recently, as well.  Current pain is located in anterolateral thigh in distribution of LFCN.  Pain is described burning with allodynia -- touch, certain clothes and water running over leg is painful.   There is no weakness  She went to a pain management center ([facility]) in [place] to consider LFCN procedures.   She did not get a phone call back to schedule the procedure.       LFCN History:   She had breast reduction surgery on November 4th and she reports that the right thigh was 'going to sleep' with numbness and tingling later that night.    Over the next  week it got more painful and by December, the pain was very severe.  Pain was apparently so bad that she was screaming out in the middle of the night. She describes a gnawing pain that is constant but also has a stabbing pain frequently.   She was worked in at her Primary care provider's office, Dr. [name], and was diagnosed with right meralgia paresthetica.   Lyrica was started.  She saw neurosurgery and the Lyrica dose was increased from 75  to 150 mg twice a day with only minimal relief. She returned to her primary care provider's office on 02/16/2014 and tramadol was added.    Of note, she gained about 40 pounds during the 4 weeks between her two primary care visits.   On the higher dose of Lyrica, the stabbing pain was helped but the more annoying constant pain was not. She was switched from Lyrica to gabapentin with benefit (02/2014).      Lamotrigine was not tolerated (added 03/2014).   Lidocaine ointment helps some (added 2/20165).    Oxcabazepine added 09/2014 with improvement, dose slwowly titrated to 600 mg po bid.       REVIEW OF SYSTEMS:  Constitutional: No fevers, chills, sweats, or change in appetite Eyes: No visual changes, double vision, eye pain Ear, nose and throat: No hearing loss, ear pain, nasal congestion, sore throat  Cardiovascular: No chest pain, palpitations Respiratory:  No shortness of breath at rest or with exertion.   No wheezes GastrointestinaI: No nausea, vomiting, diarrhea, abdominal pain, fecal incontinence Genitourinary:  No dysuria, urinary retention or frequency.  No nocturia. Musculoskeletal:  No neck pain, some back pain, moderate knee pain   Integumentary: No rash, pruritus, skin lesions Neurological: as above  ALLERGIES: Allergies  Allergen Reactions  . Lamictal [Lamotrigine] Other (See Comments)    headache     HOME MEDICATIONS: Outpatient Prescriptions Prior to Visit  Medication Sig Dispense Refill  . albuterol (PROVENTIL) (2.5 MG/3ML) 0.083% nebulizer  solution Take 3 mLs (2.5 mg total) by nebulization every 6 (six) hours as needed for shortness of breath. 75 mL 1  . calcium-vitamin D (OSCAL WITH D) 500-200 MG-UNIT per tablet Take 1 tablet by mouth daily.     . cyclobenzaprine (FLEXERIL) 10 MG tablet Take 1 tablet (10 mg total) by mouth 2 (two) times daily as needed for muscle spasms. 20 tablet 0  . docusate sodium (COLACE) 250 MG capsule Take 250 mg by mouth daily as needed for constipation.     . ferrous sulfate 325 (65 FE) MG tablet Take 325 mg by mouth daily with breakfast.    . furosemide (LASIX) 40 MG tablet Take 40 mg by mouth daily.     [name] gabapentin (NEURONTIN) 800 MG tablet Take 1 tablet (800 mg total) by mouth 4 (four) times daily. 120 tablet 5  . HYDROcodone-homatropine (HYCODAN) 5-1.5 MG/5ML syrup Take 5 mLs by mouth every 6 (six) hours as needed for cough. 120 mL 0  . levothyroxine (SYNTHROID, LEVOTHROID) 100 MCG tablet Take 100 mcg by mouth daily before breakfast. Reported on 02/16/2015    . lidocaine (XYLOCAINE) 5 % ointment Dispense 2 50 g tubes  Apply to right thigh twice a day 100 g 3  . mometasone-formoterol (DULERA) 200-5 MCG/ACT AERO Inhale 2 puffs into the lungs 2 (two) times daily. 1 Inhaler 3  . montelukast (SINGULAIR) 10 MG tablet Take 10 mg by mouth daily with breakfast.    . Multiple Vitamin (MULTIVITAMIN) capsule Take 1 capsule by mouth daily.     . naproxen (NAPROSYN) 500 MG tablet Take 500 mg by mouth 2 (two) times daily as needed for mild pain. Reported on 02/16/2015    . Omeprazole-Sodium Bicarbonate (ZEGERID OTC PO) Take 1 tablet by mouth daily.    . OXcarbazepine (TRILEPTAL) 300 MG tablet Take 1 po in am and 2 at night (Patient taking differently: Take 300 mg by mouth 4 (four) times daily. ) 90 tablet 11  . polyethylene glycol (MIRALAX / GLYCOLAX) packet Take 17 g by mouth 2 (two) times daily.     . predniSONE (DELTASONE) 20 MG tablet Take 2 tablets (40 mg total) by mouth daily with breakfast. For the next four days 8  tablet 0  . spironolactone (ALDACTONE) 25 MG tablet Take 25 mg by mouth daily.    . traMADol (ULTRAM) 50 MG tablet Take 50 mg by mouth 3 (three) times daily as needed for moderate pain.     No facility-administered medications prior to visit.    PAST MEDICAL HISTORY: Past Medical History  Diagnosis Date  . Dyspnea   . Gastroenteritis   . Hyperlipidemia   . Diverticulitis   . Atrial mass   . Hypothyroidism   . Asthma   . Urgency of urination   . Frequency of urination   . GERD (gastroesophageal reflux disease)   . Arthritis   .  Environmental allergies   . Obesity   . OSA (obstructive sleep apnea)     wears CPAP night pt does not know settings  . Hemorrhoids     external with some bleeding  . Anemia sept /oct 2014  . Glaucoma   . Constipation   . PONV (postoperative nausea and vomiting)     states she gets sick on her stomach after surgery  . Neuropathy ([place])     PAST SURGICAL HISTORY: Past Surgical History  Procedure Laterality Date  . Cholecystectomy  2008  . Polyps removal  2007  . Tubal ligation  1992  . Carpal tunnel release      right  . Colonoscopy    . Knee arthroscopy  03/18/2012    Procedure: ARTHROSCOPY KNEE;  Surgeon: Mcarthur Rossetti, MD;  Location: [facility];  Service: Orthopedics;  Laterality: Right;  Right knee arthroscopy with debridement and three compartment chondroplasty  . Colonoscopy with propofol N/A 02/24/2013    Procedure: COLONOSCOPY WITH PROPOFOL;  Surgeon: Ladene Artist, MD;  Location: [facility];  Service: Endoscopy;  Laterality: N/A;  . Breast reduction surgery Bilateral 12/16/2013    Procedure: BILATERAL BREAST REDUCTION;  Surgeon: Theodoro Kos, DO;  Location: [facility];  Service: Plastics;  Laterality: Bilateral;  . Carpal tunnel release Right   . Breast reduction surgery Bilateral   . Cholecystectomy    . Tubal ligaton      FAMILY HISTORY: Family History  Problem Relation Age of Onset  . Heart murmur Mother     rheumatoid  arthritis  . Heart attack Mother   . COPD Mother   . Diabetes Mother   . Asthma Brother     and mother both as a child  . Prostate cancer Father   . Kidney failure Father     SOCIAL HISTORY:  Social History   Social History  . Marital Status: Married    Spouse Name: N/A  . Number of Children: 2  . Years of Education: N/A   Occupational History  . 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     Social History Main Topics  . Smoking status: Former Smoker -- 2.00 packs/day for 7 years    Types: Cigarettes    Quit date: 02/12/1990  . Smokeless tobacco: Never Used  . Alcohol Use: Yes     Comment: rare  . Drug Use: No  . Sexual Activity: Not on file   Other Topics Concern  . Not on file   Social History Narrative     PHYSICAL EXAM  Filed Vitals:   06/15/15 1626  BP: 138/76  Pulse: 88  Resp: 22  Height: 5\' 6"  (1.676 m)  Weight: 385 lb (174.635 kg)    Body mass index is 62.17 kg/(m^2).   General: The patient is well-developed and well-nourished and in no acute distress.       Neurologic Exam  Mental status: The patient is alert and oriented x 3 at the time of the examination. The patient has apparent normal recent and remote memory, with an apparently normal attention span and concentration ability.   Speech is normal.  Cranial nerves: Extraocular movements are full.  Facial strength is normal.  Trapezius and sternocleidomastoid strength is normal. No dysarthria is noted.     Motor:  Muscle bulk and tone are normal. Strength is  5 / 5 in all 4 extremities.   Sensory: Sensory testing is intact to touch sense on  all 4 extremities except for allodynia over the right anterolateral thigh In the distribution of the LFCN.  Gait and station: Station is normal and gait is arthritic with a small stride..   Reflexes: Deep tendon reflexes are symmetric and normal bilaterally.     DIAGNOSTIC DATA (LABS, IMAGING, TESTING) - I reviewed patient records,  labs, notes, testing and imaging myself where available.  Lab Results  Component Value Date   WBC 12.6* 04/29/2015   HGB 13.1 04/29/2015   HCT 39.4 04/29/2015   MCV 62.9* 04/29/2015   PLT 328 04/29/2015      Component Value Date/Time   NA 140 04/29/2015 1338   K 4.9 04/29/2015 1338   CL 104 04/29/2015 1338   CO2 22 04/29/2015 1338   GLUCOSE 94 04/29/2015 1338   BUN 17 04/29/2015 1338   CREATININE 0.83 04/29/2015 1338   CALCIUM 9.4 04/29/2015 1338   PROT 8.0 12/16/2013 0910   ALBUMIN 3.7 12/16/2013 0910   AST 13 12/16/2013 0910   ALT 13 12/16/2013 0910   ALKPHOS 75 12/16/2013 0910   BILITOT 0.2* 12/16/2013 0910   GFRNONAA >60 04/29/2015 1338   GFRAA >60 04/29/2015 1338   Lab Results  Component Value Date   CHOL 209* 09/22/2009   HDL 62 09/22/2009   LDLCALC 116* 09/22/2009   TRIG 156* 09/22/2009   CHOLHDL 3.4 Ratio 09/22/2009   Lab Results  Component Value Date   HGBA1C 5.7* 12/16/2013   No results found for: VITAMINB12 Lab Results  Component Value Date   TSH 6.759* 01/11/2009    CLINICAL DATA: Chronic low back pain for 7 years  EXAM: MRI LUMBAR SPINE WITHOUT CONTRAST  TECHNIQUE: Multiplanar, multisequence MR imaging was performed. No intravenous contrast was administered.  COMPARISON: 12/10/2006.  FINDINGS: There is a mild levoconvex curve of the lumbar spine with the apex at L3. The spinal cord terminates at the L1 level. The paraspinal soft tissues are within normal limits. Marrow signal is normal.  T11-T12 and T12-L1 appear normal.  L1-L2: Negative.  L2-L3: Mild disc desiccation with shallow broad-based central protrusion but no resulting stenosis.  L3-L4: Disc desiccation and mild loss of height. Right foraminal protrusion is present with associated annular tear. This produces very mild right foraminal stenosis with the protrusion contacting exiting right L3 nerve in the foramen. The left neural foramen and central canal are  patent. Disc protrusion just contacts the descending right L4 nerve as it approaches the lateral recess.  L4-L5: Disc desiccation. Central annular tear with right paracentral protrusion. Very mild central stenosis. Mild encroachment on the right lateral recess and descending right L5 nerve. The neural foramina appear adequately patent.  L5-S1: Degenerated disc with loss of height. Vacuum disc. Central canal patent. Foramina appear adequately patent. Right paracentral broad-based protrusion is present mildly encroaching on the right S1 nerve in the lateral recess.  IMPRESSION: 1. L3-L4 right foraminal annular tear and protrusion with mild right foraminal stenosis potentially affecting the right L3 nerve. 2. L4-L5 annular tear and right paracentral protrusion with mild central stenosis and mild encroachment on the descending right L5 nerve. 3. L5-S1 disc degeneration with broad-based right paracentral protrusion minimally encroaching on the right lateral recess.   Electronically Signed  By: Dereck Ligas M.D.  On: 06/04/2013 08:03     ASSESSMENT AND PLAN  Meralgia paraesthetica, right  Allodynia  Morbid obesity, unspecified obesity type ([place])    1.   She has milder allodynia over the lateral femoral cutaneous nerve neurotome with combo  of oxcarbazepine and gabapentin.  2.  She will f/u with [facility] for LFCN procedures which may give a more significant benefit 3.   She will return to see me in 6 months or sooner if she has new or worsening neurologic symptoms.   Sherrod Toothman A. Felecia Shelling, MD, PhD [id number], [id number] PM Certified in Neurology, Clinical Neurophysiology, Sleep Medicine, Pain Medicine and Neuroimaging  [facility] [address], [place], [place] [address] [phone]

## 2015-06-16 ENCOUNTER — Ambulatory Visit: Payer: Medicare Other | Admitting: Internal Medicine

## 2015-06-28 DIAGNOSIS — G5711 Meralgia paresthetica, right lower limb: Secondary | ICD-10-CM | POA: Diagnosis not present

## 2015-07-19 ENCOUNTER — Telehealth: Payer: Self-pay | Admitting: Neurology

## 2015-07-19 NOTE — Telephone Encounter (Signed)
Faxed records to [facility] on 07/15/15-SLB

## 2015-07-26 DIAGNOSIS — M170 Bilateral primary osteoarthritis of knee: Secondary | ICD-10-CM | POA: Diagnosis not present

## 2015-08-01 DIAGNOSIS — Z0271 Encounter for disability determination: Secondary | ICD-10-CM

## 2015-08-29 ENCOUNTER — Other Ambulatory Visit: Payer: Self-pay | Admitting: Acute Care

## 2015-08-29 DIAGNOSIS — N200 Calculus of kidney: Secondary | ICD-10-CM | POA: Diagnosis not present

## 2015-09-16 DIAGNOSIS — K529 Noninfective gastroenteritis and colitis, unspecified: Secondary | ICD-10-CM | POA: Diagnosis not present

## 2015-09-16 DIAGNOSIS — R3915 Urgency of urination: Secondary | ICD-10-CM | POA: Diagnosis not present

## 2015-10-25 ENCOUNTER — Other Ambulatory Visit: Payer: Self-pay | Admitting: Neurology

## 2015-10-25 ENCOUNTER — Encounter: Payer: Self-pay | Admitting: *Deleted

## 2015-10-25 ENCOUNTER — Other Ambulatory Visit: Payer: Self-pay | Admitting: *Deleted

## 2015-10-25 MED ORDER — OXCARBAZEPINE 300 MG PO TABS: ORAL_TABLET | ORAL | 5 refills | Status: DC

## 2015-11-03 DIAGNOSIS — R7301 Impaired fasting glucose: Secondary | ICD-10-CM | POA: Diagnosis not present

## 2015-11-03 DIAGNOSIS — E784 Other hyperlipidemia: Secondary | ICD-10-CM | POA: Diagnosis not present

## 2015-11-03 DIAGNOSIS — E038 Other specified hypothyroidism: Secondary | ICD-10-CM | POA: Diagnosis not present

## 2015-11-10 DIAGNOSIS — Z1389 Encounter for screening for other disorder: Secondary | ICD-10-CM | POA: Diagnosis not present

## 2015-11-10 DIAGNOSIS — D508 Other iron deficiency anemias: Secondary | ICD-10-CM | POA: Diagnosis not present

## 2015-11-10 DIAGNOSIS — E784 Other hyperlipidemia: Secondary | ICD-10-CM | POA: Diagnosis not present

## 2015-11-10 DIAGNOSIS — N200 Calculus of kidney: Secondary | ICD-10-CM | POA: Diagnosis not present

## 2015-11-10 DIAGNOSIS — Z23 Encounter for immunization: Secondary | ICD-10-CM | POA: Diagnosis not present

## 2015-11-10 DIAGNOSIS — Z0000 Encounter for general adult medical examination without abnormal findings: Secondary | ICD-10-CM | POA: Diagnosis not present

## 2015-11-10 DIAGNOSIS — M170 Bilateral primary osteoarthritis of knee: Secondary | ICD-10-CM | POA: Diagnosis not present

## 2015-11-10 DIAGNOSIS — Z6841 Body Mass Index (BMI) 40.0 and over, adult: Secondary | ICD-10-CM | POA: Diagnosis not present

## 2015-11-10 DIAGNOSIS — G4733 Obstructive sleep apnea (adult) (pediatric): Secondary | ICD-10-CM | POA: Diagnosis not present

## 2015-11-10 DIAGNOSIS — M1711 Unilateral primary osteoarthritis, right knee: Secondary | ICD-10-CM | POA: Diagnosis not present

## 2015-11-10 DIAGNOSIS — D509 Iron deficiency anemia, unspecified: Secondary | ICD-10-CM | POA: Diagnosis not present

## 2015-11-10 DIAGNOSIS — J45909 Unspecified asthma, uncomplicated: Secondary | ICD-10-CM | POA: Diagnosis not present

## 2015-11-10 DIAGNOSIS — M1712 Unilateral primary osteoarthritis, left knee: Secondary | ICD-10-CM | POA: Diagnosis not present

## 2015-11-10 DIAGNOSIS — E038 Other specified hypothyroidism: Secondary | ICD-10-CM | POA: Diagnosis not present

## 2015-11-10 DIAGNOSIS — R7301 Impaired fasting glucose: Secondary | ICD-10-CM | POA: Diagnosis not present

## 2015-12-16 ENCOUNTER — Ambulatory Visit (INDEPENDENT_AMBULATORY_CARE_PROVIDER_SITE_OTHER): Payer: Medicare Other | Admitting: Neurology

## 2015-12-16 ENCOUNTER — Encounter: Payer: Self-pay | Admitting: Neurology

## 2015-12-16 VITALS — BP 128/86 | HR 90 | Resp 20 | Ht 66.0 in | Wt 390.5 lb

## 2015-12-16 DIAGNOSIS — M5137 Other intervertebral disc degeneration, lumbosacral region: Secondary | ICD-10-CM

## 2015-12-16 DIAGNOSIS — G5711 Meralgia paresthetica, right lower limb: Secondary | ICD-10-CM | POA: Diagnosis not present

## 2015-12-16 DIAGNOSIS — R208 Other disturbances of skin sensation: Secondary | ICD-10-CM | POA: Diagnosis not present

## 2015-12-16 NOTE — Progress Notes (Signed)
[facility]  PATIENT: Melissa Rowe DOB: 05/09/1965   REFERRING CLINICIAN: Dr. [name] HISTORY FROM: Patient  REASON FOR VISIT: Lateral thigh pain   HISTORICAL  CHIEF COMPLAINT:  Chief Complaint  Patient presents with  . Meralgia Paraesthetica    Sts. she had a procedure at [facility] to help numbness right thigh.  Sts. since procedure, numbness anterior thigh is improved, but numbnss lateral thigh is worse.  Sts. she continues Gabapentin and Oxcarbazepine as rx'd/fim    HISTORY OF PRESENT ILLNESS:  Melissa Rowe is a 50 yo woman with right anterolateral thigh numbness and pain due to a Lateral femoral cutaneous neuropathy.     Pain improved on oxcarbazapine 600 mg po bid and gabapentin but did not resolve. Lidocaine ointment helps her wear pants with less pain.     Since her last visit, she has been to [facility] and had a procedure (06/2015). She reports that since the procedure dysesthesia and allodynia in the anterior thigh has improved but she continues to have dysesthesia and allodynia in the lateral thigh.  She tolerates oxcarbazepine and gabapentin well at her current doses. The past she had more difficulty tolerating Lyrica (severe weight gain and strange dreams) and lamotrigine (felt dizzy).    LFCN History:   She had breast reduction surgery on November 4th and she reports that the right thigh was 'going to sleep' with numbness and tingling later that night.    Over the next week it got more painful and by December, the pain was very severe.  Pain was apparently so bad that she was screaming out in the middle of the night. She describes a gnawing pain that is constant but also has a stabbing pain frequently.   She was worked in at her Primary care provider's office, Dr. [name], and was diagnosed with right meralgia paresthetica.   Lyrica was started.  She saw neurosurgery and the Lyrica dose was increased from 75  to 150 mg twice a  day with only minimal relief. She returned to her primary care provider's office on 02/16/2014 and tramadol was added.    Of note, she gained about 40 pounds during the 4 weeks between her two primary care visits.   On the higher dose of Lyrica, the stabbing pain was helped but the more annoying constant pain was not. She was switched from Lyrica to gabapentin with benefit (02/2014).      Lamotrigine was not tolerated (added 03/2014).   Lidocaine ointment helps some (added 2/20165).    Oxcabazepine added 09/2014 with improvement, dose slwowly titrated to 600 mg po bid.  around May 2017 she had a procedure at [facility] with improvement of the anterior half of the painful region but not the lateral half.[name]        REVIEW OF SYSTEMS:  Constitutional: No fevers, chills, sweats, or change in appetite Eyes: No visual changes, double vision, eye pain Ear, nose and throat: No hearing loss, ear pain, nasal congestion, sore throat Cardiovascular: No chest pain, palpitations Respiratory:  No shortness of breath at rest or with exertion.   No wheezes GastrointestinaI: No nausea, vomiting, diarrhea, abdominal pain, fecal incontinence Genitourinary:  No dysuria, urinary retention or frequency.  No nocturia. Musculoskeletal:  No neck pain, some back pain, moderate knee pain   Integumentary: No rash, pruritus, skin lesions Neurological: as above  ALLERGIES: Allergies  Allergen Reactions  . Lamictal [Lamotrigine] Other (See Comments)    headache  HOME MEDICATIONS: Outpatient Medications Prior to Visit  Medication Sig Dispense Refill  . albuterol (PROVENTIL) (2.5 MG/3ML) 0.083% nebulizer solution USE 1 VIAL IN NEBULIZER EVERY 6 HOURS FOR SHORTNESS OF BREATH/WHEEZING AND AS NEEDED 30 vial 10  . calcium-vitamin D (OSCAL WITH D) 500-200 MG-UNIT per tablet Take 1 tablet by mouth daily.     . cyclobenzaprine (FLEXERIL) 10 MG tablet Take 1 tablet (10 mg total) by mouth 2 (two) times daily as needed  for muscle spasms. 20 tablet 0  . docusate sodium (COLACE) 250 MG capsule Take 250 mg by mouth daily as needed for constipation.     . ferrous sulfate 325 (65 FE) MG tablet Take 325 mg by mouth daily with breakfast.    . furosemide (LASIX) 40 MG tablet Take 40 mg by mouth daily.     [name] gabapentin (NEURONTIN) 800 MG tablet TAKE ONE TABLET BY MOUTH 4 TIMES DAILY 120 tablet 5  . HYDROcodone-homatropine (HYCODAN) 5-1.5 MG/5ML syrup Take 5 mLs by mouth every 6 (six) hours as needed for cough. 120 mL 0  . levothyroxine (SYNTHROID, LEVOTHROID) 100 MCG tablet Take 100 mcg by mouth daily before breakfast. Reported on 02/16/2015    . lidocaine (XYLOCAINE) 5 % ointment Dispense 2 50 g tubes  Apply to right thigh twice a day 100 g 3  . mometasone-formoterol (DULERA) 200-5 MCG/ACT AERO Inhale 2 puffs into the lungs 2 (two) times daily. 1 Inhaler 3  . montelukast (SINGULAIR) 10 MG tablet Take 10 mg by mouth daily with breakfast.    . Multiple Vitamin (MULTIVITAMIN) capsule Take 1 capsule by mouth daily.     Earney Navy Bicarbonate (ZEGERID OTC PO) Take 1 tablet by mouth daily.    . Oxcarbazepine (TRILEPTAL) 300 MG tablet Take 2 tablets BID. 120 tablet 5  . polyethylene glycol (MIRALAX / GLYCOLAX) packet Take 17 g by mouth 2 (two) times daily.     [name] spironolactone (ALDACTONE) 25 MG tablet Take 25 mg by mouth daily.    . traMADol (ULTRAM) 50 MG tablet Take 50 mg by mouth 3 (three) times daily as needed for moderate pain.    . naproxen (NAPROSYN) 500 MG tablet Take 500 mg by mouth 2 (two) times daily as needed for mild pain. Reported on 02/16/2015    . predniSONE (DELTASONE) 20 MG tablet Take 2 tablets (40 mg total) by mouth daily with breakfast. For the next four days (Patient not taking: Reported on 12/16/2015) 8 tablet 0   No facility-administered medications prior to visit.     PAST MEDICAL HISTORY: Past Medical History:  Diagnosis Date  . Anemia sept /oct 2014  . Arthritis   . Asthma   . Atrial  mass   . Constipation   . Diverticulitis   . Dyspnea   . Environmental allergies   . Frequency of urination   . Gastroenteritis   . GERD (gastroesophageal reflux disease)   . Glaucoma   . Hemorrhoids    external with some bleeding  . Hyperlipidemia   . Hypothyroidism   . Neuropathy ([place])   . Obesity   . OSA (obstructive sleep apnea)    wears CPAP night pt does not know settings  . PONV (postoperative nausea and vomiting)    states she gets sick on her stomach after surgery  . Urgency of urination     PAST SURGICAL HISTORY: Past Surgical History:  Procedure Laterality Date  . BREAST REDUCTION SURGERY Bilateral 12/16/2013   Procedure: BILATERAL BREAST REDUCTION;  Surgeon: Theodoro Kos, DO;  Location: [place];  Service: Plastics;  Laterality: Bilateral;  . BREAST REDUCTION SURGERY Bilateral   . CARPAL TUNNEL RELEASE     right  . CARPAL TUNNEL RELEASE Right   . CHOLECYSTECTOMY  2008  . CHOLECYSTECTOMY    . COLONOSCOPY    . COLONOSCOPY WITH PROPOFOL N/A 02/24/2013   Procedure: COLONOSCOPY WITH PROPOFOL;  Surgeon: Ladene Artist, MD;  Location: [facility];  Service: Endoscopy;  Laterality: N/A;  . KNEE ARTHROSCOPY  03/18/2012   Procedure: ARTHROSCOPY KNEE;  Surgeon: Mcarthur Rossetti, MD;  Location: [place];  Service: Orthopedics;  Laterality: Right;  Right knee arthroscopy with debridement and three compartment chondroplasty  . polyps removal  2007  . TUBAL LIGATION  1992  . tubal ligaton      FAMILY HISTORY: Family History  Problem Relation Age of Onset  . Heart murmur Mother     rheumatoid arthritis  . Heart attack Mother   . COPD Mother   . Diabetes Mother   . Asthma Brother     and mother both as a child  . Prostate cancer Father   . Kidney failure Father     SOCIAL HISTORY:  Social History   Social History  . Marital status: Married    Spouse name: N/A  . Number of children: 2  . Years of education: N/A   Occupational History  . 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  Unemployed   Social History Main Topics  . Smoking status: Former Smoker    Packs/day: 2.00    Years: 7.00    Types: Cigarettes    Quit date: 02/12/1990  . Smokeless tobacco: Never Used  . Alcohol use Yes     Comment: rare  . Drug use: No  . Sexual activity: Not on file   Other Topics Concern  . Not on file   Social History Narrative  . No narrative on file     PHYSICAL EXAM  Vitals:   12/16/15 0945  BP: 128/86  Pulse: 90  Resp: 20  Weight: (!) 390 lb 8 oz (177.1 kg)  Height: 5\' 6"  (1.676 m)    Body mass index is 63.03 kg/m.   General: The patient is well-developed and well-nourished and in no acute distress.       Neurologic Exam  Mental status: The patient is alert and oriented x 3 at the time of the examination. The patient has apparent normal recent and remote memory, with an apparently normal attention span and concentration ability.   Speech is normal.  Cranial nerves: Extraocular movements are full.  Facial strength is normal.  Trapezius and sternocleidomastoid strength is normal. No dysarthria is noted.     Motor:  Muscle bulk and tone are normal. Strength is  5 / 5 in all 4 extremities.   Sensory: Sensory testing is intact to touch sense on all 4 extremities except for numbness over the right anterolateral thigh In the distribution of the LFCN.  The more anterior portion of this neurotome is numb without allodynia in the lateral portion has hyperpathia and allodynia..  Gait and station: Station is normal and gait is arthritic with a small stride..   Reflexes: Deep tendon reflexes are symmetric and normal bilaterally.     DIAGNOSTIC DATA (LABS, IMAGING, TESTING) - I reviewed patient records, labs, notes, testing and imaging myself where available.  Lab Results  Component Value Date  WBC 12.6 (H) 04/29/2015   HGB 13.1 04/29/2015   HCT 39.4 04/29/2015   MCV 62.9 (L) 04/29/2015   PLT 328 04/29/2015       Component Value Date/Time   NA 140 04/29/2015 1338   K 4.9 04/29/2015 1338   CL 104 04/29/2015 1338   CO2 22 04/29/2015 1338   GLUCOSE 94 04/29/2015 1338   BUN 17 04/29/2015 1338   CREATININE 0.83 04/29/2015 1338   CALCIUM 9.4 04/29/2015 1338   PROT 8.0 12/16/2013 0910   ALBUMIN 3.7 12/16/2013 0910   AST 13 12/16/2013 0910   ALT 13 12/16/2013 0910   ALKPHOS 75 12/16/2013 0910   BILITOT 0.2 (L) 12/16/2013 0910   GFRNONAA >60 04/29/2015 1338   GFRAA >60 04/29/2015 1338   Lab Results  Component Value Date   CHOL 209 (H) 09/22/2009   HDL 62 09/22/2009   LDLCALC 116 (H) 09/22/2009   TRIG 156 (H) 09/22/2009   CHOLHDL 3.4 Ratio 09/22/2009   Lab Results  Component Value Date   HGBA1C 5.7 (H) 12/16/2013   No results found for: VITAMINB12 Lab Results  Component Value Date   TSH 6.759 (H) 01/11/2009    CLINICAL DATA: Chronic low back pain for 7 years  EXAM: MRI LUMBAR SPINE WITHOUT CONTRAST  TECHNIQUE: Multiplanar, multisequence MR imaging was performed. No intravenous contrast was administered.  COMPARISON: 12/10/2006.  FINDINGS: There is a mild levoconvex curve of the lumbar spine with the apex at L3. The spinal cord terminates at the L1 level. The paraspinal soft tissues are within normal limits. Marrow signal is normal.  T11-T12 and T12-L1 appear normal.  L1-L2: Negative.  L2-L3: Mild disc desiccation with shallow broad-based central protrusion but no resulting stenosis.  L3-L4: Disc desiccation and mild loss of height. Right foraminal protrusion is present with associated annular tear. This produces very mild right foraminal stenosis with the protrusion contacting exiting right L3 nerve in the foramen. The left neural foramen and central canal are patent. Disc protrusion just contacts the descending right L4 nerve as it approaches the lateral recess.  L4-L5: Disc desiccation. Central annular tear with right paracentral protrusion. Very mild  central stenosis. Mild encroachment on the right lateral recess and descending right L5 nerve. The neural foramina appear adequately patent.  L5-S1: Degenerated disc with loss of height. Vacuum disc. Central canal patent. Foramina appear adequately patent. Right paracentral broad-based protrusion is present mildly encroaching on the right S1 nerve in the lateral recess.  IMPRESSION: 1. L3-L4 right foraminal annular tear and protrusion with mild right foraminal stenosis potentially affecting the right L3 nerve. 2. L4-L5 annular tear and right paracentral protrusion with mild central stenosis and mild encroachment on the descending right L5 nerve. 3. L5-S1 disc degeneration with broad-based right paracentral protrusion minimally encroaching on the right lateral recess.   Electronically Signed  By: Dereck Ligas M.D.  On: 06/04/2013 08:03     ASSESSMENT AND PLAN  Meralgia paresthetica of right side  Allodynia  Degeneration of lumbar or lumbosacral intervertebral disc   1.  She still has allodynia over the lateral part of the lateral femoral cutaneous nerve neurotome despinte procedure, and combo of oxcarbazepine and gabapentin.  2.  She will f/u with [facility] for second LFCN procedures which may give a more significant benefit 3.   If pain is better, we will stop the lamotrigine and then try to reduce the gabapentin if still doing well.   She will return to see me in 6 months  or sooner if she has new or worsening neurologic symptoms.   Haruki Arnold A. Felecia Shelling, MD, PhD [id number], [id number] AM Certified in Neurology, Clinical Neurophysiology, Sleep Medicine, Pain Medicine and Neuroimaging  [facility] [address] [place], [place] [address] [phone]

## 2016-02-22 ENCOUNTER — Ambulatory Visit (INDEPENDENT_AMBULATORY_CARE_PROVIDER_SITE_OTHER): Payer: Medicare Other | Admitting: Physician Assistant

## 2016-03-06 ENCOUNTER — Telehealth (INDEPENDENT_AMBULATORY_CARE_PROVIDER_SITE_OTHER): Payer: Self-pay | Admitting: Orthopaedic Surgery

## 2016-03-06 NOTE — Telephone Encounter (Signed)
Patient called asked when she come for her injection 03/19/16 can she get the synvisc one gel injection. The number to contact patient is [phone]

## 2016-03-06 NOTE — Telephone Encounter (Signed)
If she still has Medicare/Medicaid at the time of visit, yes ma'am she can

## 2016-03-06 NOTE — Telephone Encounter (Signed)
Patient aware this is being ordered but may not be ready by Feb 5

## 2016-03-06 NOTE — Telephone Encounter (Signed)
Spoke with patient and was advised she now have [place] complete  I advised her that it takes time to get approval from [employer].      Member# [id number] Gr# [id number]  Customer service# [phone]   Pharmacy# [phone]  For providers ph# is [phone]

## 2016-03-12 ENCOUNTER — Other Ambulatory Visit: Payer: Self-pay | Admitting: Neurology

## 2016-03-19 ENCOUNTER — Ambulatory Visit (INDEPENDENT_AMBULATORY_CARE_PROVIDER_SITE_OTHER): Payer: Medicare Other | Admitting: Physician Assistant

## 2016-03-19 ENCOUNTER — Encounter (INDEPENDENT_AMBULATORY_CARE_PROVIDER_SITE_OTHER): Payer: Self-pay | Admitting: Physician Assistant

## 2016-03-19 DIAGNOSIS — M170 Bilateral primary osteoarthritis of knee: Secondary | ICD-10-CM | POA: Diagnosis not present

## 2016-03-19 MED ORDER — LIDOCAINE HCL 1 % IJ SOLN
3.0000 mL | INTRAMUSCULAR | Status: AC | PRN
  Administered 2016-03-19: 3 mL

## 2016-03-19 MED ORDER — METHYLPREDNISOLONE ACETATE 40 MG/ML IJ SUSP
40.0000 mg | INTRAMUSCULAR | Status: AC | PRN
  Administered 2016-03-19: 40 mg via INTRA_ARTICULAR

## 2016-03-19 NOTE — Progress Notes (Signed)
Office Visit Note   Patient: Melissa Rowe           Date of Birth: 07/30/1965           MRN: [id number] Visit Date: 03/19/2016              Requested by: Marton Redwood, MD [address] [place], [place] [address] PCP: Marton Redwood, MD   Assessment & Plan: Visit Diagnoses:  1. Primary osteoarthritis of both knees     Plan: Check on supplemental injection for both knees will call her when these are available. Otherwise follow-up when necessary.  Follow-Up Instructions: Return if symptoms worsen or fail to improve.   Orders:  Orders Placed This Encounter  Procedures  . Large Joint Injection/Arthrocentesis  . Large Joint Injection/Arthrocentesis   No orders of the defined types were placed in this encounter.     Procedures: Large Joint Inj Date/Time: 03/19/2016 1:30 PM Performed by: Pete Pelt Authorized by: Pete Pelt   Consent Given by:  Patient Indications:  Pain Location:  Knee Site:  L knee Needle Size:  22 G Approach:  Anterolateral Ultrasound Guidance: No   Fluoroscopic Guidance: No   Medications:  40 mg methylPREDNISolone acetate 40 MG/ML; 3 mL lidocaine 1 % Aspiration Attempted: No   Patient tolerance:  Patient tolerated the procedure well with no immediate complications Large Joint Inj Date/Time: 03/19/2016 1:31 PM Performed by: Pete Pelt Authorized by: Pete Pelt   Consent Given by:  Patient Indications:  Pain Location:  Knee Site:  R knee Needle Size:  22 G Approach:  Anterolateral Ultrasound Guidance: No   Fluoroscopic Guidance: No   Medications:  40 mg methylPREDNISolone acetate 40 MG/ML; 3 mL lidocaine 1 % Aspiration Attempted: No   Patient tolerance:  Patient tolerated the procedure well with no immediate complications     Clinical Data: No additional findings.   Subjective: Chief Complaint  Patient presents with  . Right Knee - Pain  . Left Knee - Pain    HPI Dates both knees are bothering her  presently the left greater than right.She has recently got approval for silver sneakers is excited about starting to work out to try to lose weight Review of Systems   Objective: Vital Signs: There were no vitals taken for this visit.  Physical Exam  Ortho Exam Knees full extension flexion to 110. No instability valgus varus stressing effusion no abnormal warmth no erythema of either knee. Tenderness along the lateral joint line of both knees. Specialty Comments:  No specialty comments available.  Imaging: No results found.   PMFS History: Patient Active Problem List   Diagnosis Date Noted  . Allodynia 03/26/2014  . Meralgia paraesthetica 02/26/2014  . Degeneration of lumbar or lumbosacral intervertebral disc 02/26/2014  . Symptomatic mammary hypertrophy 12/16/2013  . Acute bronchitis 04/02/2013  . Flu-like symptoms 04/02/2013  . Blood in stool 02/24/2013  . Iron deficiency anemia, unspecified 02/24/2013  . Asthma with acute exacerbation 12/28/2012  . Post-nasal drainage 09/02/2012  . Arthritis of knee, right 03/18/2012  . Preoperative respiratory examination 03/14/2012  . Preop respiratory exam 02/28/2012  . Acute URI 02/12/2012  . Vitamin D deficiency 09/21/2010  . MORBID OBESITY 10/14/2008  . Obstructive sleep apnea 10/14/2008  . Moderate persistent asthma 06/02/2008   Past Medical History:  Diagnosis Date  . Anemia sept /oct 2014  . Arthritis   . Asthma   . Atrial mass   . Constipation   . Diverticulitis   .  Dyspnea   . Environmental allergies   . Frequency of urination   . Gastroenteritis   . GERD (gastroesophageal reflux disease)   . Glaucoma   . Hemorrhoids    external with some bleeding  . Hyperlipidemia   . Hypothyroidism   . Neuropathy ([place])   . Obesity   . OSA (obstructive sleep apnea)    wears CPAP night pt does not know settings  . PONV (postoperative nausea and vomiting)    states she gets sick on her stomach after surgery  . Urgency of  urination     Family History  Problem Relation Age of Onset  . Heart murmur Mother     rheumatoid arthritis  . Heart attack Mother   . COPD Mother   . Diabetes Mother   . Asthma Brother     and mother both as a child  . Prostate cancer Father   . Kidney failure Father     Past Surgical History:  Procedure Laterality Date  . BREAST REDUCTION SURGERY Bilateral 12/16/2013   Procedure: BILATERAL BREAST REDUCTION;  Surgeon: Theodoro Kos, DO;  Location: [place];  Service: Plastics;  Laterality: Bilateral;  . BREAST REDUCTION SURGERY Bilateral   . CARPAL TUNNEL RELEASE     right  . CARPAL TUNNEL RELEASE Right   . CHOLECYSTECTOMY  2008  . CHOLECYSTECTOMY    . COLONOSCOPY    . COLONOSCOPY WITH PROPOFOL N/A 02/24/2013   Procedure: COLONOSCOPY WITH PROPOFOL;  Surgeon: Ladene Artist, MD;  [facility];  Service: Endoscopy;  Laterality: N/A;  . KNEE ARTHROSCOPY  03/18/2012   Procedure: ARTHROSCOPY KNEE;  Surgeon: Mcarthur Rossetti, MD;  Location: [facility];  Service: Orthopedics;  Laterality: Right;  Right knee arthroscopy with debridement and three compartment chondroplasty  . polyps removal  2007  . TUBAL LIGATION  1992  . tubal ligaton     Social History   Occupational History  . 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  Unemployed   Social History Main Topics  . Smoking status: Former Smoker    Packs/day: 2.00    Years: 7.00    Types: Cigarettes    Quit date: 02/12/1990  . Smokeless tobacco: Never Used  . Alcohol use Yes     Comment: rare  . Drug use: No  . Sexual activity: Not on file

## 2016-03-27 ENCOUNTER — Other Ambulatory Visit: Payer: Self-pay | Admitting: Neurology

## 2016-06-14 ENCOUNTER — Ambulatory Visit (INDEPENDENT_AMBULATORY_CARE_PROVIDER_SITE_OTHER): Payer: Medicare Other | Admitting: Physician Assistant

## 2016-06-14 ENCOUNTER — Ambulatory Visit: Payer: Medicare Other | Admitting: Neurology

## 2016-06-14 DIAGNOSIS — M170 Bilateral primary osteoarthritis of knee: Secondary | ICD-10-CM | POA: Diagnosis not present

## 2016-06-14 MED ORDER — HYLAN G-F 20 48 MG/6ML IX SOSY
48.0000 mg | PREFILLED_SYRINGE | INTRA_ARTICULAR | Status: AC | PRN
  Administered 2016-06-14: 48 mg via INTRA_ARTICULAR

## 2016-06-14 MED ORDER — LIDOCAINE HCL 1 % IJ SOLN
3.0000 mL | INTRAMUSCULAR | Status: AC | PRN
  Administered 2016-06-14: 3 mL

## 2016-06-14 NOTE — Progress Notes (Signed)
   Procedure Note  Patient: Melissa Rowe             Date of Birth: 30-Sep-1965           MRN: [id number]             Visit Date: 06/14/2016  Procedures: Visit Diagnoses: No diagnosis found. History of present illness: Melissa Rowe comes in today for bilateral Synvisc injections of her knees. She's had no new injury. She has known arthritis of both knees.  Physical exam:  Bilateral knees with full extension and full flexion. No instability. No abnormal warmth erythema or effusion.     Large Joint Inj Date/Time: 06/14/2016 5:10 PM Performed by: Pete Pelt Authorized by: Pete Pelt   Consent Given by:  Patient Indications:  Pain Location:  Knee Site:  L knee Needle Size:  18 G Approach:  Anterolateral Ultrasound Guidance: No   Fluoroscopic Guidance: No   Medications:  3 mL lidocaine 1 %; 48 mg Hylan 48 MG/6ML Aspiration Attempted: No   Patient tolerance:  Patient tolerated the procedure well with no immediate complications Large Joint Inj Date/Time: 06/14/2016 5:11 PM Performed by: Pete Pelt Authorized by: Pete Pelt   Location:  Knee Site:  R knee Needle Size:  22 G Needle Length:  1.5 inches Approach:  Anterolateral Ultrasound Guidance: No   Fluoroscopic Guidance: No   Arthrogram: No   Medications:  3 mL lidocaine 1 %; 48 mg Hylan 48 MG/6ML Aspiration Attempted: No   Patient tolerance:  Patient tolerated the procedure well with no immediate complications   Plan: She'll follow with [place] in 2 months check progress lack of. Did discuss with her again in getting back into exercise program and suggested that she try swimming or water aerobics.

## 2016-08-14 ENCOUNTER — Encounter: Payer: Self-pay | Admitting: Neurology

## 2016-08-14 ENCOUNTER — Ambulatory Visit (INDEPENDENT_AMBULATORY_CARE_PROVIDER_SITE_OTHER): Payer: Medicare Other | Admitting: Orthopaedic Surgery

## 2016-08-14 ENCOUNTER — Ambulatory Visit (INDEPENDENT_AMBULATORY_CARE_PROVIDER_SITE_OTHER): Payer: Medicare Other | Admitting: Neurology

## 2016-08-14 VITALS — BP 118/85 | HR 103 | Resp 24 | Ht 66.0 in | Wt 386.5 lb

## 2016-08-14 DIAGNOSIS — G8929 Other chronic pain: Secondary | ICD-10-CM | POA: Diagnosis not present

## 2016-08-14 DIAGNOSIS — M25562 Pain in left knee: Secondary | ICD-10-CM | POA: Diagnosis not present

## 2016-08-14 DIAGNOSIS — M1711 Unilateral primary osteoarthritis, right knee: Secondary | ICD-10-CM | POA: Diagnosis not present

## 2016-08-14 DIAGNOSIS — M25561 Pain in right knee: Secondary | ICD-10-CM | POA: Diagnosis not present

## 2016-08-14 DIAGNOSIS — M1712 Unilateral primary osteoarthritis, left knee: Secondary | ICD-10-CM | POA: Diagnosis not present

## 2016-08-14 DIAGNOSIS — G4733 Obstructive sleep apnea (adult) (pediatric): Secondary | ICD-10-CM

## 2016-08-14 DIAGNOSIS — G5711 Meralgia paresthetica, right lower limb: Secondary | ICD-10-CM

## 2016-08-14 DIAGNOSIS — G4752 REM sleep behavior disorder: Secondary | ICD-10-CM | POA: Insufficient documentation

## 2016-08-14 MED ORDER — LIDOCAINE 5 % EX OINT: TOPICAL_OINTMENT | CUTANEOUS | 3 refills | Status: DC

## 2016-08-14 MED ORDER — METHYLPREDNISOLONE ACETATE 40 MG/ML IJ SUSP
40.0000 mg | INTRAMUSCULAR | Status: AC | PRN
  Administered 2016-08-14: 40 mg via INTRA_ARTICULAR

## 2016-08-14 MED ORDER — CLONAZEPAM 0.5 MG PO TABS: ORAL_TABLET | ORAL | 5 refills | Status: DC

## 2016-08-14 MED ORDER — LIDOCAINE HCL 1 % IJ SOLN
3.0000 mL | INTRAMUSCULAR | Status: AC | PRN
  Administered 2016-08-14: 3 mL

## 2016-08-14 MED ORDER — GABAPENTIN 800 MG PO TABS: 800.0000 mg | ORAL_TABLET | Freq: Four times a day (QID) | ORAL | 5 refills | Status: DC

## 2016-08-14 NOTE — Progress Notes (Signed)
[facility]  PATIENT: Melissa Rowe DOB: March 08, 1965   REFERRING CLINICIAN: Dr. [name] HISTORY FROM: Patient  REASON FOR VISIT: Lateral thigh pain   HISTORICAL  CHIEF COMPLAINT:  Chief Complaint  Patient presents with  . Meralgia Paresthetica    Sts. pain/numbness left thigh are improved with procedures done at [facility].  Sts. she continues Trileptal and Gabapentin as rx'd.  Has had some active dreams, talking in her sleep in the last few weeks/fim    HISTORY OF PRESENT ILLNESS:  Melissa Rowe is a 51 yo woman with right anterolateral thigh numbness and pain due to a Lateral femoral cutaneous neuropathy.   Additionally, recently, she has had active dreams where she yells out during the night.  The lateral femoral cutaneous nerve pain improved on oxcarbazepine 600 mg by mouth twice a day with gabapentin 4 times a day. However, there was still a fair amount of residual pain due to the dysesthesia and allodynia.   She tolerates oxcarbazepine and gabapentin well at her current doses. The past she had more difficulty tolerating Lyrica (severe weight gain and strange dreams) and lamotrigine (felt dizzy).    She had a couple of injections at [facility] and has done better.    She is going to have another LFCN procedure at [facility].     Much of the distribution of the LFCN is now numb but there is still a painful patch (most posterolateral).    The lidocaine ointment helps this pain some.     She has had nightmare dreams the past 2 several months.   She screams things like 'Run Run'.   Dreams are usually between 2-4 am.  She moves her legs some but has not punched or kicked or gotten out of bed.  This is now happening most nights.       She denies any muscle rigidity, gait changes.  She has had a couple episodes of sleep paralysis this year.   Maybe one episode of hypnagogic hallucination.    She had never had cataplexy   She has OSA and is on CPAP.    She is often sleepy.      LFCN History:   She had breast reduction surgery on November 4th and she reports that the right thigh was 'going to sleep' with numbness and tingling later that night.    Over the next week it got more painful and by December, the pain was very severe.  Pain was apparently so bad that she was screaming out in the middle of the night. She describes a gnawing pain that is constant but also has a stabbing pain frequently.   She was worked in at her Primary care provider's office, Dr. [name], and was diagnosed with right meralgia paresthetica.   Lyrica was started.  She saw neurosurgery and the Lyrica dose was increased from 75  to 150 mg twice a day with only minimal relief. She returned to her primary care provider's office on 02/16/2014 and tramadol was added.    Of note, she gained about 40 pounds during the 4 weeks between her two primary care visits.   On the higher dose of Lyrica, the stabbing pain was helped but the more annoying constant pain was not. She was switched from Lyrica to gabapentin with benefit (02/2014).      Lamotrigine was not tolerated (added 03/2014).   Lidocaine ointment helps some (added 2/20165).    Oxcabazepine added 09/2014 with improvement, dose  slwowly titrated to 600 mg po bid.  around May 2017 she had a procedure at [facility] with improvement of the anterior half of the painful region but not the lateral half.[name]        REVIEW OF SYSTEMS:  Constitutional: No fevers, chills, sweats, or change in appetite Eyes: No visual changes, double vision, eye pain Ear, nose and throat: No hearing loss, ear pain, nasal congestion, sore throat Cardiovascular: No chest pain, palpitations Respiratory:  No shortness of breath at rest or with exertion.   No wheezes.    She has OSA and is on CPAP. GastrointestinaI: No nausea, vomiting, diarrhea, abdominal pain, fecal incontinence Genitourinary:  No dysuria, urinary retention or frequency.  No  nocturia. Musculoskeletal:  No neck pain, some back pain, moderate knee pain   Integumentary: No rash, pruritus, skin lesions Neurological: as above  ALLERGIES: Allergies  Allergen Reactions  . Lamictal [Lamotrigine] Other (See Comments)    headache     HOME MEDICATIONS: Outpatient Medications Prior to Visit  Medication Sig Dispense Refill  . albuterol (PROVENTIL) (2.5 MG/3ML) 0.083% nebulizer solution USE 1 VIAL IN NEBULIZER EVERY 6 HOURS FOR SHORTNESS OF BREATH/WHEEZING AND AS NEEDED 30 vial 10  . calcium-vitamin D (OSCAL WITH D) 500-200 MG-UNIT per tablet Take 1 tablet by mouth daily.     . cyclobenzaprine (FLEXERIL) 10 MG tablet Take 1 tablet (10 mg total) by mouth 2 (two) times daily as needed for muscle spasms. 20 tablet 0  . ferrous sulfate 325 (65 FE) MG tablet Take 325 mg by mouth daily with breakfast.    . furosemide (LASIX) 40 MG tablet Take 40 mg by mouth daily.     [name] levothyroxine (SYNTHROID, LEVOTHROID) 100 MCG tablet Take 100 mcg by mouth daily before breakfast. Reported on 02/16/2015    . mometasone-formoterol (DULERA) 200-5 MCG/ACT AERO Inhale 2 puffs into the lungs 2 (two) times daily. 1 Inhaler 3  . montelukast (SINGULAIR) 10 MG tablet Take 10 mg by mouth daily with breakfast.    . Multiple Vitamin (MULTIVITAMIN) capsule Take 1 capsule by mouth daily.     Earney Navy Bicarbonate (ZEGERID OTC PO) Take 1 tablet by mouth daily.    . Oxcarbazepine (TRILEPTAL) 300 MG tablet TAKE ONE TABLET BY MOUTH ONCE DAILY IN THE MORNING AND  2  TABLETS  AT  NIGHT 90 tablet 11  . polyethylene glycol (MIRALAX / GLYCOLAX) packet Take 17 g by mouth 2 (two) times daily.     [name] spironolactone (ALDACTONE) 25 MG tablet Take 25 mg by mouth daily.    . traMADol (ULTRAM) 50 MG tablet Take 50 mg by mouth 3 (three) times daily as needed for moderate pain.    [name] docusate sodium (COLACE) 250 MG capsule Take 250 mg by mouth daily as needed for constipation.     . gabapentin (NEURONTIN) 800 MG  tablet TAKE ONE TABLET BY MOUTH 4 TIMES DAILY 120 tablet 5  . lidocaine (XYLOCAINE) 5 % ointment Dispense 2 50 g tubes  Apply to right thigh twice a day 100 g 3  . predniSONE (DELTASONE) 20 MG tablet Take 2 tablets (40 mg total) by mouth daily with breakfast. For the next four days 8 tablet 0  . HYDROcodone-homatropine (HYCODAN) 5-1.5 MG/5ML syrup Take 5 mLs by mouth every 6 (six) hours as needed for cough. 120 mL 0  . naproxen (NAPROSYN) 500 MG tablet Take 500 mg by mouth 2 (two) times daily as needed for mild pain. Reported on  02/16/2015     No facility-administered medications prior to visit.     PAST MEDICAL HISTORY: Past Medical History:  Diagnosis Date  . Anemia sept /oct 2014  . Arthritis   . Asthma   . Atrial mass   . Constipation   . Diverticulitis   . Dyspnea   . Environmental allergies   . Frequency of urination   . Gastroenteritis   . GERD (gastroesophageal reflux disease)   . Glaucoma   . Hemorrhoids    external with some bleeding  . Hyperlipidemia   . Hypothyroidism   . Neuropathy   . Obesity   . OSA (obstructive sleep apnea)    wears CPAP night pt does not know settings  . PONV (postoperative nausea and vomiting)    states she gets sick on her stomach after surgery  . Urgency of urination     PAST SURGICAL HISTORY: Past Surgical History:  Procedure Laterality Date  . BREAST REDUCTION SURGERY Bilateral 12/16/2013   Procedure: BILATERAL BREAST REDUCTION;  Surgeon: Theodoro Kos, DO;  Location: [facility];  Service: Plastics;  Laterality: Bilateral;  . BREAST REDUCTION SURGERY Bilateral   . CARPAL TUNNEL RELEASE     right  . CARPAL TUNNEL RELEASE Right   . CHOLECYSTECTOMY  2008  . CHOLECYSTECTOMY    . COLONOSCOPY    . COLONOSCOPY WITH PROPOFOL N/A 02/24/2013   Procedure: COLONOSCOPY WITH PROPOFOL;  Surgeon: Ladene Artist, MD;  Location: [facility];  Service: Endoscopy;  Laterality: N/A;  . KNEE ARTHROSCOPY  03/18/2012   Procedure: ARTHROSCOPY KNEE;  Surgeon:  Mcarthur Rossetti, MD;  Location: [facility];  Service: Orthopedics;  Laterality: Right;  Right knee arthroscopy with debridement and three compartment chondroplasty  . polyps removal  2007  . TUBAL LIGATION  1992  . tubal ligaton      FAMILY HISTORY: Family History  Problem Relation Age of Onset  . Heart murmur Mother        rheumatoid arthritis  . Heart attack Mother   . COPD Mother   . Diabetes Mother   . Asthma Brother        and mother both as a child  . Prostate cancer Father   . Kidney failure Father     SOCIAL HISTORY:  Social History   Social History  . Marital status: Married    Spouse name: N/A  . Number of children: 2  . Years of education: N/A   Occupational History  . 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  Unemployed   Social History Main Topics  . Smoking status: Former Smoker    Packs/day: 2.00    Years: 7.00    Types: Cigarettes    Quit date: 02/12/1990  . Smokeless tobacco: Never Used  . Alcohol use Yes     Comment: rare  . Drug use: No  . Sexual activity: Not on file   Other Topics Concern  . Not on file   Social History Narrative  . No narrative on file     PHYSICAL EXAM  Vitals:   08/14/16 1427  BP: 118/85  Pulse: (!) 103  Resp: (!) 24  Weight: (!) 386 lb 8 oz (175.3 kg)  Height: 5\' 6"  (1.676 m)    Body mass index is 62.38 kg/m.   General: The patient Is an obese woman in no acute distress.   Neurologic Exam  Mental status: The patient is alert and oriented x 3  at the time of the examination. The patient has apparent normal recent and remote memory, with an apparently normal attention span and concentration ability.   Speech is normal.  Cranial nerves: Extraocular movements are full.  Facial strength is normal.  Trapezius and sternocleidomastoid strength is normal. No dysarthria is noted.     Motor:  Muscle bulk and tone are normal. Strength is  5 / 5 in all 4 extremities.   Sensory:    She has reduced  sensation over the right anterolateral thigh in the distribution of lateral femoral cutaneous nerve. The more anterior portion of the neurotome is numb without any allodynia. The more lateral portion still has hyperpathia and allodynia.  She has normal sensation to touch elsewhere..  Gait and station: Station is normal and gait is arthritic with a small stride.  She reports knee pain as she walks...   Reflexes: Deep tendon reflexes are symmetric and normal bilaterally.     DIAGNOSTIC DATA (LABS, IMAGING, TESTING) - I reviewed patient records, labs, notes, testing and imaging myself where available.  Lab Results  Component Value Date   WBC 12.6 (H) 04/29/2015   HGB 13.1 04/29/2015   HCT 39.4 04/29/2015   MCV 62.9 (L) 04/29/2015   PLT 328 04/29/2015      Component Value Date/Time   NA 140 04/29/2015 1338   K 4.9 04/29/2015 1338   CL 104 04/29/2015 1338   CO2 22 04/29/2015 1338   GLUCOSE 94 04/29/2015 1338   BUN 17 04/29/2015 1338   CREATININE 0.83 04/29/2015 1338   CALCIUM 9.4 04/29/2015 1338   PROT 8.0 12/16/2013 0910   ALBUMIN 3.7 12/16/2013 0910   AST 13 12/16/2013 0910   ALT 13 12/16/2013 0910   ALKPHOS 75 12/16/2013 0910   BILITOT 0.2 (L) 12/16/2013 0910   GFRNONAA >60 04/29/2015 1338   GFRAA >60 04/29/2015 1338   Lab Results  Component Value Date   CHOL 209 (H) 09/22/2009   HDL 62 09/22/2009   LDLCALC 116 (H) 09/22/2009   TRIG 156 (H) 09/22/2009   CHOLHDL 3.4 Ratio 09/22/2009   Lab Results  Component Value Date   HGBA1C 5.7 (H) 12/16/2013   No results found for: VITAMINB12 Lab Results  Component Value Date   TSH 6.759 (H) 01/11/2009    CLINICAL DATA: Chronic low back pain for 7 years  EXAM: MRI LUMBAR SPINE WITHOUT CONTRAST  TECHNIQUE: Multiplanar, multisequence MR imaging was performed. No intravenous contrast was administered.  COMPARISON: 12/10/2006.  FINDINGS: There is a mild levoconvex curve of the lumbar spine with the apex at L3.  The spinal cord terminates at the L1 level. The paraspinal soft tissues are within normal limits. Marrow signal is normal.  T11-T12 and T12-L1 appear normal.  L1-L2: Negative.  L2-L3: Mild disc desiccation with shallow broad-based central protrusion but no resulting stenosis.  L3-L4: Disc desiccation and mild loss of height. Right foraminal protrusion is present with associated annular tear. This produces very mild right foraminal stenosis with the protrusion contacting exiting right L3 nerve in the foramen. The left neural foramen and central canal are patent. Disc protrusion just contacts the descending right L4 nerve as it approaches the lateral recess.  L4-L5: Disc desiccation. Central annular tear with right paracentral protrusion. Very mild central stenosis. Mild encroachment on the right lateral recess and descending right L5 nerve. The neural foramina appear adequately patent.  L5-S1: Degenerated disc with loss of height. Vacuum disc. Central canal patent. Foramina appear adequately patent. Right paracentral  broad-based protrusion is present mildly encroaching on the right S1 nerve in the lateral recess.  IMPRESSION: 1. L3-L4 right foraminal annular tear and protrusion with mild right foraminal stenosis potentially affecting the right L3 nerve. 2. L4-L5 annular tear and right paracentral protrusion with mild central stenosis and mild encroachment on the descending right L5 nerve. 3. L5-S1 disc degeneration with broad-based right paracentral protrusion minimally encroaching on the right lateral recess.   Electronically Signed  By: Dereck Ligas M.D.  On: 06/04/2013 08:03     ASSESSMENT AND PLAN  Meralgia paresthetica of right side  MORBID OBESITY  REM behavioral disorder  Obstructive sleep apnea   1.  Her pain has been mostly controlled on the combination of oxcarbazepine with gabapentin and procedures from [facility]. She is  going to have another procedure next month. .   I will renew her medications.  2.   She will f/u with [facility] for second LFCN procedures which may give a more significant benefit 3.   If her pain improves after that procedure, consider tapering one of her 2 seizure medications. 4.   Her active dreams a likely REM behavior disorder. However, since she has had a couple episodes of sleep paralysis I can't completely rule out narcolepsy without cataplexy.    I will have her try a low dose of a benzodiazepine. If this is not successful, and the daytime sleepiness worsens, consider a PSG followed by [facility].    She will return to see me in 6 months or sooner if she has new or worsening neurologic symptoms.   Richard A. Felecia Shelling, MD, PhD 08/14/4285, 6:81 PM Certified in Neurology, Clinical Neurophysiology, Sleep Medicine, Pain Medicine and Neuroimaging  [facility] [address], [place], [place] [address] [phone]

## 2016-08-14 NOTE — Progress Notes (Signed)
Office Visit Note   Patient: Melissa Rowe           Date of Birth: 22-Nov-1965           MRN: [id number] Visit Date: 08/14/2016              Requested by: Marton Redwood, MD [address] [place], [place] [address] PCP: Marton Redwood, MD   Assessment & Plan: Visit Diagnoses: No diagnosis found.  Plan: At this point I recommend any other types of injections. Again I have encouraged her about weight loss. We'll see her back in 6 months to see how she doing overall metastases when we can start the process of steroid injections and then hyaluronic acid injections again.  Follow-Up Instructions: Return in about 6 months (around 02/14/2017).   Orders:  Orders Placed This Encounter  Procedures  . Large Joint Injection/Arthrocentesis  . Large Joint Injection/Arthrocentesis   No orders of the defined types were placed in this encounter.     Procedures: Large Joint Inj Date/Time: 08/14/2016 9:49 AM Performed by: Mcarthur Rossetti Authorized by: Jean Rosenthal Y   Location:  Knee Site:  R knee Ultrasound Guidance: No   Fluoroscopic Guidance: No   Arthrogram: No   Medications:  3 mL lidocaine 1 %; 40 mg methylPREDNISolone acetate 40 MG/ML Large Joint Inj Date/Time: 08/14/2016 9:49 AM Performed by: Mcarthur Rossetti Authorized by: Mcarthur Rossetti   Location:  Knee Site:  L knee Ultrasound Guidance: No   Fluoroscopic Guidance: No   Arthrogram: No   Medications:  3 mL lidocaine 1 %; 40 mg methylPREDNISolone acetate 40 MG/ML     Clinical Data: No additional findings.   Subjective: No chief complaint on file.  The patient is well-known to the office. She is morbidly obese 51 year old with bilateral knee tricompartmental arthritic changes. Her weight is been an issue for her and for [place] in terms of being able to provide a safe joint replacement. I have not been comfortable considering joint replacement surgery on her. She is 2 months out from  hyaluronic acid injections in both knees. She says she is feeling better and injections were helpful but she "has her days". She endplates with assistive device. She has not had any recent weight loss at all.  HPI  Review of Systems She currently denies any headache, chest pain, shortness of breath, fever, chills, nausea, vomiting.  Objective: Vital Signs: There were no vitals taken for this visit.  Physical Exam She is alert and oriented 3 and in no acute distress Ortho Exam Examination of both knees show no effusion the patellofemoral crepitation with good range of motion. Both knees have varus malalignment global tenderness. Specialty Comments:  No specialty comments available.  Imaging: No results found.   PMFS History: Patient Active Problem List   Diagnosis Date Noted  . Primary osteoarthritis of both knees 06/14/2016  . Allodynia 03/26/2014  . Meralgia paraesthetica 02/26/2014  . Degeneration of lumbar or lumbosacral intervertebral disc 02/26/2014  . Symptomatic mammary hypertrophy 12/16/2013  . Acute bronchitis 04/02/2013  . Flu-like symptoms 04/02/2013  . Blood in stool 02/24/2013  . Iron deficiency anemia, unspecified 02/24/2013  . Asthma with acute exacerbation 12/28/2012  . Post-nasal drainage 09/02/2012  . Arthritis of knee, right 03/18/2012  . Preoperative respiratory examination 03/14/2012  . Preop respiratory exam 02/28/2012  . Acute URI 02/12/2012  . Vitamin D deficiency 09/21/2010  . MORBID OBESITY 10/14/2008  . Obstructive sleep apnea 10/14/2008  . Moderate persistent  asthma 06/02/2008   Past Medical History:  Diagnosis Date  . Anemia sept /oct 2014  . Arthritis   . Asthma   . Atrial mass   . Constipation   . Diverticulitis   . Dyspnea   . Environmental allergies   . Frequency of urination   . Gastroenteritis   . GERD (gastroesophageal reflux disease)   . Glaucoma   . Hemorrhoids    external with some bleeding  . Hyperlipidemia   .  Hypothyroidism   . Neuropathy ([facility])   . Obesity   . OSA (obstructive sleep apnea)    wears CPAP night pt does not know settings  . PONV (postoperative nausea and vomiting)    states she gets sick on her stomach after surgery  . Urgency of urination     Family History  Problem Relation Age of Onset  . Heart murmur Mother        rheumatoid arthritis  . Heart attack Mother   . COPD Mother   . Diabetes Mother   . Asthma Brother        and mother both as a child  . Prostate cancer Father   . Kidney failure Father     Past Surgical History:  Procedure Laterality Date  . BREAST REDUCTION SURGERY Bilateral 12/16/2013   Procedure: BILATERAL BREAST REDUCTION;  Surgeon: Theodoro Kos, DO;  Location: [facility];  Service: Plastics;  Laterality: Bilateral;  . BREAST REDUCTION SURGERY Bilateral   . CARPAL TUNNEL RELEASE     right  . CARPAL TUNNEL RELEASE Right   . CHOLECYSTECTOMY  2008  . CHOLECYSTECTOMY    . COLONOSCOPY    . COLONOSCOPY WITH PROPOFOL N/A 02/24/2013   Procedure: COLONOSCOPY WITH PROPOFOL;  Surgeon: Ladene Artist, MD;  Location: [facility];  Service: Endoscopy;  Laterality: N/A;  . KNEE ARTHROSCOPY  03/18/2012   Procedure: ARTHROSCOPY KNEE;  Surgeon: Mcarthur Rossetti, MD;  Location: [facility];  Service: Orthopedics;  Laterality: Right;  Right knee arthroscopy with debridement and three compartment chondroplasty  . polyps removal  2007  . TUBAL LIGATION  1992  . tubal ligaton     Social History   Occupational History  . 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  Unemployed   Social History Main Topics  . Smoking status: Former Smoker    Packs/day: 2.00    Years: 7.00    Types: Cigarettes    Quit date: 02/12/1990  . Smokeless tobacco: Never Used  . Alcohol use Yes     Comment: rare  . Drug use: No  . Sexual activity: Not on file

## 2016-11-16 ENCOUNTER — Other Ambulatory Visit: Payer: Self-pay | Admitting: Neurology

## 2016-11-23 DIAGNOSIS — Z0000 Encounter for general adult medical examination without abnormal findings: Secondary | ICD-10-CM | POA: Diagnosis not present

## 2016-12-03 ENCOUNTER — Telehealth (INDEPENDENT_AMBULATORY_CARE_PROVIDER_SITE_OTHER): Payer: Self-pay | Admitting: Orthopaedic Surgery

## 2016-12-03 NOTE — Telephone Encounter (Signed)
Patient wants to now if she would be eligible to get a steroid shot before January. Wants a call to know so she can get appointment

## 2016-12-04 ENCOUNTER — Other Ambulatory Visit: Payer: Self-pay | Admitting: Internal Medicine

## 2016-12-04 DIAGNOSIS — Z1231 Encounter for screening mammogram for malignant neoplasm of breast: Secondary | ICD-10-CM

## 2016-12-04 NOTE — Telephone Encounter (Signed)
Yes, she can have a regular cortisone injection if she would like If she is talking about the Monovisc, she CAN NOT have that until after November 5th

## 2016-12-12 ENCOUNTER — Telehealth: Payer: Self-pay | Admitting: *Deleted

## 2016-12-12 MED ORDER — GABAPENTIN 800 MG PO TABS: 800.0000 mg | ORAL_TABLET | Freq: Four times a day (QID) | ORAL | 1 refills | Status: DC

## 2016-12-12 NOTE — Telephone Encounter (Signed)
Gabapentin escribed to [facility] per faxed request/fim

## 2016-12-13 HISTORY — PX: BREAST BIOPSY: SHX20

## 2016-12-14 ENCOUNTER — Telehealth: Payer: Self-pay | Admitting: Neurology

## 2016-12-14 MED ORDER — OXCARBAZEPINE 300 MG PO TABS: 600.0000 mg | ORAL_TABLET | Freq: Two times a day (BID) | ORAL | 1 refills | Status: DC

## 2016-12-14 NOTE — Telephone Encounter (Signed)
Pt request refill forOxcarbazepine (TRILEPTAL) 300 MG tablet  sent to [facility]. She also needs refill forclonazePAM (KLONOPIN) 0.5 MG tablet  She said [facility] has not rec'd it so she is wanting RX sent to mail order. Please take [employer] [facility] off, she will not be using them

## 2016-12-14 NOTE — Telephone Encounter (Signed)
LMOM.  Oxcarbazepine escribed to [facility] as requested.  [facility] does not typically give 90 day rx's of Benzodiazepines, so this won't be sent to mail order.  Rx. given to her at her ov on 08/14/16 had 5 refills, so she should have r/f available at her pharmacy/fim

## 2016-12-18 NOTE — Telephone Encounter (Signed)
Pt called back, she is not using any [facility] at all, she's had a lot of problems with them. She is requesting clonazePAM (KLONOPIN) 0.5 MG tablet sent to [facility].

## 2016-12-18 NOTE — Telephone Encounter (Signed)
Spoke with Melissa Rowe and explained we are not able to write 90 day rx's for benzodiazepines.  She verbalized understanding of same, will leave that  rx. at [facility]

## 2016-12-20 ENCOUNTER — Ambulatory Visit
Admission: RE | Admit: 2016-12-20 | Discharge: 2016-12-20 | Disposition: A | Discharge status: Home / Self Care | Payer: Medicare Other | Source: Ambulatory Visit | Attending: Internal Medicine | Admitting: Internal Medicine

## 2016-12-20 DIAGNOSIS — Z1231 Encounter for screening mammogram for malignant neoplasm of breast: Secondary | ICD-10-CM

## 2016-12-21 ENCOUNTER — Other Ambulatory Visit: Payer: Self-pay | Admitting: Internal Medicine

## 2016-12-21 DIAGNOSIS — R928 Other abnormal and inconclusive findings on diagnostic imaging of breast: Secondary | ICD-10-CM

## 2016-12-24 ENCOUNTER — Ambulatory Visit (INDEPENDENT_AMBULATORY_CARE_PROVIDER_SITE_OTHER): Payer: Medicare Other | Admitting: Orthopaedic Surgery

## 2016-12-24 DIAGNOSIS — M1712 Unilateral primary osteoarthritis, left knee: Secondary | ICD-10-CM

## 2016-12-24 DIAGNOSIS — M1711 Unilateral primary osteoarthritis, right knee: Secondary | ICD-10-CM | POA: Diagnosis not present

## 2016-12-24 MED ORDER — METHYLPREDNISOLONE ACETATE 40 MG/ML IJ SUSP
40.0000 mg | INTRAMUSCULAR | Status: AC | PRN
Start: 2016-12-24 — End: 2016-12-24
  Administered 2016-12-24: 40 mg via INTRA_ARTICULAR

## 2016-12-24 MED ORDER — LIDOCAINE HCL 1 % IJ SOLN
3.0000 mL | INTRAMUSCULAR | Status: AC | PRN
  Administered 2016-12-24: 3 mL

## 2016-12-24 MED ORDER — METHYLPREDNISOLONE ACETATE 40 MG/ML IJ SUSP
40.0000 mg | INTRAMUSCULAR | Status: AC | PRN
  Administered 2016-12-24: 40 mg via INTRA_ARTICULAR

## 2016-12-24 NOTE — Progress Notes (Signed)
   Procedure Note  Patient: Melissa Rowe             Date of Birth: [birth year]           MRN: [id number]             Visit Date: 12/24/2016  Procedures: Visit Diagnoses: Unilateral primary osteoarthritis, left knee  Unilateral primary osteoarthritis, right knee  Large Joint Inj: R knee on 12/24/2016 3:20 PM Indications: diagnostic evaluation and pain Details: 22 G 1.5 in needle, superolateral approach  Arthrogram: No  Medications: 3 mL lidocaine 1 %; 40 mg methylPREDNISolone acetate 40 MG/ML Outcome: tolerated well, no immediate complications Procedure, treatment alternatives, risks and benefits explained, specific risks discussed. Consent was given by the patient. Immediately prior to procedure a time out was called to verify the correct patient, procedure, equipment, support staff and site/side marked as required. Patient was prepped and draped in the usual sterile fashion.   Large Joint Inj: L knee on 12/24/2016 3:20 PM Indications: diagnostic evaluation and pain Details: 22 G 1.5 in needle, superolateral approach  Arthrogram: No  Medications: 3 mL lidocaine 1 %; 40 mg methylPREDNISolone acetate 40 MG/ML Outcome: tolerated well, no immediate complications Procedure, treatment alternatives, risks and benefits explained, specific risks discussed. Consent was given by the patient. Immediately prior to procedure a time out was called to verify the correct patient, procedure, equipment, support staff and site/side marked as required. Patient was prepped and draped in the usual sterile fashion.    The patient is well-known to [place].  She is a 52 year old morbidly obese female with known severe arthritis of both knees.  Her body mass index is high enough that we would not recommend knee replacement surgeries until she loses more weight.  She has a large soft tissue envelope around both knees.  She does get steroid injections from time to time.  Is been over 3 months or more since her  last injections.  Her knees are hurting enough that she like to have injections today.  She understands the risk and benefits of these injections fully.  She understands her continued recommendation for weight loss and activity modification.  On exam both knees have significant patellofemoral crepitation with varus malalignment and significant pain throughout its arc of motion but range of motion is full and there is no blocks to motion.  Again we counseled her about weight loss and activity modification.  I agree with her trying still with steroid injections to help decrease her pain and hopefully improve her mobility enough to exercise more to lose weight.  She tolerated injections well.

## 2016-12-27 ENCOUNTER — Other Ambulatory Visit: Payer: Self-pay | Admitting: Internal Medicine

## 2016-12-27 ENCOUNTER — Ambulatory Visit
Admission: RE | Admit: 2016-12-27 | Discharge: 2016-12-27 | Disposition: A | Discharge status: Home / Self Care | Payer: Medicare Other | Source: Ambulatory Visit | Attending: Internal Medicine | Admitting: Internal Medicine

## 2016-12-27 DIAGNOSIS — R928 Other abnormal and inconclusive findings on diagnostic imaging of breast: Secondary | ICD-10-CM

## 2016-12-31 ENCOUNTER — Other Ambulatory Visit: Payer: Self-pay | Admitting: Internal Medicine

## 2016-12-31 ENCOUNTER — Ambulatory Visit
Admission: RE | Admit: 2016-12-31 | Discharge: 2016-12-31 | Disposition: A | Discharge status: Home / Self Care | Payer: Medicare Other | Source: Ambulatory Visit | Attending: Internal Medicine | Admitting: Internal Medicine

## 2016-12-31 DIAGNOSIS — R928 Other abnormal and inconclusive findings on diagnostic imaging of breast: Secondary | ICD-10-CM

## 2017-02-12 ENCOUNTER — Ambulatory Visit (INDEPENDENT_AMBULATORY_CARE_PROVIDER_SITE_OTHER): Payer: Medicare Other | Admitting: Orthopaedic Surgery

## 2017-02-14 ENCOUNTER — Ambulatory Visit (INDEPENDENT_AMBULATORY_CARE_PROVIDER_SITE_OTHER): Payer: Medicare Other | Admitting: Orthopaedic Surgery

## 2017-02-15 ENCOUNTER — Other Ambulatory Visit: Payer: Self-pay | Admitting: Neurology

## 2017-02-19 ENCOUNTER — Telehealth: Payer: Self-pay | Admitting: Neurology

## 2017-02-19 NOTE — Telephone Encounter (Signed)
Pt is want a refill for 30 days instead on 14 days clonazePAM (KLONOPIN) 0.5 MG tablet

## 2017-02-21 ENCOUNTER — Ambulatory Visit: Payer: Medicare Other | Admitting: Neurology

## 2017-03-14 MED ORDER — CLONAZEPAM 0.5 MG PO TABS: ORAL_TABLET | ORAL | 2 refills | Status: DC

## 2017-03-14 NOTE — Telephone Encounter (Signed)
Per Dr. Felecia Shelling, ok for #30 tablets and 2 refills. Rx signed and faxed.

## 2017-04-05 ENCOUNTER — Other Ambulatory Visit: Payer: Self-pay | Admitting: Neurology

## 2017-04-23 ENCOUNTER — Ambulatory Visit (INDEPENDENT_AMBULATORY_CARE_PROVIDER_SITE_OTHER): Payer: Medicare Other

## 2017-04-23 ENCOUNTER — Encounter (INDEPENDENT_AMBULATORY_CARE_PROVIDER_SITE_OTHER): Payer: Self-pay | Admitting: Orthopaedic Surgery

## 2017-04-23 ENCOUNTER — Ambulatory Visit: Payer: Medicare Other | Admitting: Neurology

## 2017-04-23 ENCOUNTER — Ambulatory Visit (INDEPENDENT_AMBULATORY_CARE_PROVIDER_SITE_OTHER): Payer: Medicare Other | Admitting: Orthopaedic Surgery

## 2017-04-23 ENCOUNTER — Telehealth (INDEPENDENT_AMBULATORY_CARE_PROVIDER_SITE_OTHER): Payer: Self-pay

## 2017-04-23 DIAGNOSIS — M5442 Lumbago with sciatica, left side: Secondary | ICD-10-CM | POA: Diagnosis not present

## 2017-04-23 DIAGNOSIS — G8929 Other chronic pain: Secondary | ICD-10-CM | POA: Diagnosis not present

## 2017-04-23 DIAGNOSIS — M25561 Pain in right knee: Secondary | ICD-10-CM

## 2017-04-23 DIAGNOSIS — M5441 Lumbago with sciatica, right side: Secondary | ICD-10-CM

## 2017-04-23 DIAGNOSIS — M25562 Pain in left knee: Secondary | ICD-10-CM | POA: Diagnosis not present

## 2017-04-23 MED ORDER — METHYLPREDNISOLONE ACETATE 40 MG/ML IJ SUSP
40.0000 mg | INTRAMUSCULAR | Status: AC | PRN
  Administered 2017-04-23: 40 mg via INTRA_ARTICULAR

## 2017-04-23 MED ORDER — LIDOCAINE HCL 1 % IJ SOLN
0.5000 mL | INTRAMUSCULAR | Status: AC | PRN
  Administered 2017-04-23: .5 mL

## 2017-04-23 NOTE — Progress Notes (Signed)
Office Visit Note   Patient: Melissa Rowe           Date of Birth: [birth year]           MRN: [id number] Visit Date: 04/23/2017              Requested by: Melissa Redwood, MD [address] [place], [place] [address] PCP: Melissa Redwood, MD   Assessment & Plan: Visit Diagnoses:  1. Chronic pain of both knees   2. Chronic bilateral low back pain with bilateral sciatica     Plan: Currently we are trying to obtain supplemental injections for her knees.  Will obtain an MRI of her lumbar spine to assess for epidural steroid injections in the lumbar spine.  She will follow-up after the MRI to go over results and discuss further treatment.  Follow-Up Instructions: Return for after MRI.   Orders:  Orders Placed This Encounter  Procedures  . Large Joint Inj: bilateral knee  . XR Knee 1-2 Views Left  . XR Knee 1-2 Views Right  . XR Lumbar Spine 2-3 Views   No orders of the defined types were placed in this encounter.     Procedures: Large Joint Inj: bilateral knee on 04/23/2017 11:53 AM Indications: pain Details: 22 G 1.5 in needle, anterolateral approach  Arthrogram: No  Medications (Right): 0.5 mL lidocaine 1 %; 40 mg methylPREDNISolone acetate 40 MG/ML Medications (Left): 0.5 mL lidocaine 1 %; 40 mg methylPREDNISolone acetate 40 MG/ML Outcome: tolerated well, no immediate complications Procedure, treatment alternatives, risks and benefits explained, specific risks discussed. Consent was given by the patient. Immediately prior to procedure a time out was called to verify the correct patient, procedure, equipment, support staff and site/side marked as required. Patient was prepped and draped in the usual sterile fashion.       Clinical Data: No additional findings.   Subjective: Chief Complaint  Patient presents with  . Left Knee - Follow-up    HPI 's Steward is well-known to Dr. Ninfa Rowe service comes in today requesting injections in both knees.  She has known  tricompartmental arthritis of both knees.  She had no new injury.  She states both knees give way or lock on her.  She is using a cane to ambulate.  [name] a workup of her low back pain.  She states she has no numbness or tingling down either leg but does have a dull sharp pain down to the left knee.  She states his pain is getting progressively worse.  Back pain does awaken her.  She reports that she saw 5 years ago and the MRI showed some degenerative changes at L5-S1 nothing that required surgical intervention.  She is wondering if that should be a candidate for epidural steroid injections in the lumbar spine.  She has tried formal therapy in the past for back.  MRI of the spine from 5 years ago is not available. Review of Systems Denies any change in bowel or bladder function.  No fevers chills.  No shortness of breath.  2 episodes of chest pain.  Intended weight loss.  Objective: Vital Signs: There were no vitals taken for this visit.  Physical Exam  Constitutional: She is oriented to person, place, and time. She appears well-developed and well-nourished. No distress.  Pulmonary/Chest: Effort normal.  Neurological: She is alert and oriented to person, place, and time.  Skin: She is not diaphoretic.  Psychiatric: She has a normal mood and affect.  Ortho Exam 5 out of 5 strength throughout lower extremities against resistance.  Positive straight leg raise bilaterally.  Sensation grossly intact bilateral feet light touch.  Right knee -5-90 degrees of flexion tenderness medial lateral joint line no instability valgus varus stressing.  No abnormal warmth erythema or effusion.  Left knee 0-95 degrees flexion medial and lateral joint line tenderness no effusion abnormal warmth.  No instability valgus varus stressing. Specialty Comments:  No specialty comments available.  Imaging: Xr Knee 1-2 Views Left  Result Date: 04/23/2017 Left knee : No acute findings.  Tricompartmental  changes with spurring in all 3 compartments.  Medial compartment narrowing.  Xr Knee 1-2 Views Right  Result Date: 04/23/2017 Right Knee: No acute findings.  Tricompartmental changes with spurring in all 3 compartments.  Medial compartment narrowing.   Xr Lumbar Spine 2-3 Views  Result Date: 04/23/2017 Lumbar spine AP lateral views: Loss of the normal lordotic curvature.  No acute fractures.  L5-S1 degenerative disc changes with disc space narrowing.  No spondylolisthesis.    PMFS History: Patient Active Problem List   Diagnosis Date Noted  . Unilateral primary osteoarthritis, left knee 08/14/2016  . Unilateral primary osteoarthritis, right knee 08/14/2016  . Chronic pain of both knees 08/14/2016  . REM behavioral disorder 08/14/2016  . Primary osteoarthritis of both knees 06/14/2016  . Allodynia 03/26/2014  . Meralgia paraesthetica 02/26/2014  . Degeneration of lumbar or lumbosacral intervertebral disc 02/26/2014  . Symptomatic mammary hypertrophy 12/16/2013  . Acute bronchitis 04/02/2013  . Flu-like symptoms 04/02/2013  . Blood in stool 02/24/2013  . Iron deficiency anemia, unspecified 02/24/2013  . Asthma with acute exacerbation 12/28/2012  . Post-nasal drainage 09/02/2012  . Arthritis of knee, right 03/18/2012  . Preoperative respiratory examination 03/14/2012  . Preop respiratory exam 02/28/2012  . Acute URI 02/12/2012  . Vitamin D deficiency 09/21/2010  . MORBID OBESITY 10/14/2008  . Obstructive sleep apnea 10/14/2008  . Moderate persistent asthma 06/02/2008   Past Medical History:  Diagnosis Date  . Anemia sept /oct 2014  . Arthritis   . Asthma   . Atrial mass   . Constipation   . Diverticulitis   . Dyspnea   . Environmental allergies   . Frequency of urination   . Gastroenteritis   . GERD (gastroesophageal reflux disease)   . Glaucoma   . Hemorrhoids    external with some bleeding  . Hyperlipidemia   . Hypothyroidism   . Neuropathy   . Obesity   .  OSA (obstructive sleep apnea)    wears CPAP night pt does not know settings  . PONV (postoperative nausea and vomiting)    states she gets sick on her stomach after surgery  . Urgency of urination     Family History  Problem Relation Age of Onset  . Heart murmur Mother        rheumatoid arthritis  . Heart attack Mother   . COPD Mother   . Diabetes Mother   . Breast cancer Mother   . Prostate cancer Father   . Kidney failure Father   . Asthma Brother        and mother both as a child    Past Surgical History:  Procedure Laterality Date  . BREAST REDUCTION SURGERY Bilateral 12/16/2013   Procedure: BILATERAL BREAST REDUCTION;  Surgeon: Theodoro Kos, DO;  Location: [facility];  Service: Plastics;  Laterality: Bilateral;  . BREAST REDUCTION SURGERY Bilateral   . CARPAL TUNNEL RELEASE     right  .  CARPAL TUNNEL RELEASE Right   . CHOLECYSTECTOMY  2008  . CHOLECYSTECTOMY    . COLONOSCOPY    . COLONOSCOPY WITH PROPOFOL N/A 02/24/2013   Procedure: COLONOSCOPY WITH PROPOFOL;  Surgeon: Ladene Artist, MD;  Location: [facility];  Service: Endoscopy;  Laterality: N/A;  . KNEE ARTHROSCOPY  03/18/2012   Procedure: ARTHROSCOPY KNEE;  Surgeon: Mcarthur Rossetti, MD;  Location: [facility];  Service: Orthopedics;  Laterality: Right;  Right knee arthroscopy with debridement and three compartment chondroplasty  . polyps removal  2007  . REDUCTION MAMMAPLASTY    . TUBAL LIGATION  1992  . tubal ligaton     Social History   Occupational History  . 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 Smoking status: Former Smoker    Packs/day: 2.00    Years: 7.00    Pack years: 14.00    Types: Cigarettes    Last attempt to quit: 02/12/1990    Years since quitting: 27.2  . Smokeless tobacco: Never Used  Substance and Sexual Activity  . Alcohol use: Yes    Comment: rare  . Drug use: No  . Sexual activity: Not on file

## 2017-04-23 NOTE — Telephone Encounter (Signed)
No, I didn't see her name on the list.

## 2017-04-23 NOTE — Telephone Encounter (Signed)
Noted  

## 2017-04-23 NOTE — Telephone Encounter (Signed)
Patient was seen today had questions concerning gel injection.  Please advise or let me know if you need me to check the status.  Thank you.

## 2017-04-23 NOTE — Telephone Encounter (Signed)
We did order this for her right? The Monovisc?

## 2017-04-23 NOTE — Telephone Encounter (Signed)
Can we order this for her please

## 2017-04-24 ENCOUNTER — Other Ambulatory Visit (INDEPENDENT_AMBULATORY_CARE_PROVIDER_SITE_OTHER): Payer: Self-pay

## 2017-04-24 DIAGNOSIS — M4807 Spinal stenosis, lumbosacral region: Secondary | ICD-10-CM

## 2017-04-24 NOTE — Telephone Encounter (Signed)
Submitted application online for Monovisc, bilateral knee 

## 2017-05-10 ENCOUNTER — Telehealth (INDEPENDENT_AMBULATORY_CARE_PROVIDER_SITE_OTHER): Payer: Self-pay

## 2017-05-10 ENCOUNTER — Ambulatory Visit
Admission: RE | Admit: 2017-05-10 | Discharge: 2017-05-10 | Disposition: A | Discharge status: Home / Self Care | Payer: Medicare Other | Source: Ambulatory Visit | Attending: Orthopaedic Surgery | Admitting: Orthopaedic Surgery

## 2017-05-10 DIAGNOSIS — M4807 Spinal stenosis, lumbosacral region: Secondary | ICD-10-CM

## 2017-05-10 NOTE — Telephone Encounter (Signed)
Initiated PA for Monovisc, bilateral knee.  Pending authorization# [id number].

## 2017-05-14 ENCOUNTER — Encounter (INDEPENDENT_AMBULATORY_CARE_PROVIDER_SITE_OTHER): Payer: Self-pay | Admitting: Orthopaedic Surgery

## 2017-05-14 ENCOUNTER — Ambulatory Visit (INDEPENDENT_AMBULATORY_CARE_PROVIDER_SITE_OTHER): Payer: Medicare Other | Admitting: Orthopaedic Surgery

## 2017-05-14 ENCOUNTER — Other Ambulatory Visit (INDEPENDENT_AMBULATORY_CARE_PROVIDER_SITE_OTHER): Payer: Self-pay

## 2017-05-14 DIAGNOSIS — M25561 Pain in right knee: Secondary | ICD-10-CM

## 2017-05-14 DIAGNOSIS — M4807 Spinal stenosis, lumbosacral region: Secondary | ICD-10-CM | POA: Diagnosis not present

## 2017-05-14 DIAGNOSIS — G8929 Other chronic pain: Secondary | ICD-10-CM | POA: Diagnosis not present

## 2017-05-14 DIAGNOSIS — E669 Obesity, unspecified: Secondary | ICD-10-CM

## 2017-05-14 DIAGNOSIS — M25562 Pain in left knee: Secondary | ICD-10-CM | POA: Diagnosis not present

## 2017-05-14 DIAGNOSIS — M1711 Unilateral primary osteoarthritis, right knee: Secondary | ICD-10-CM

## 2017-05-14 DIAGNOSIS — M1712 Unilateral primary osteoarthritis, left knee: Secondary | ICD-10-CM

## 2017-05-14 NOTE — Progress Notes (Signed)
Office Visit Note   Patient: Melissa Rowe           Date of Birth: May 23, 1965           MRN: [id number] Visit Date: 05/14/2017              Requested by: Marton Redwood, MD [address] [place], [place] [address] PCP: Marton Redwood, MD   Assessment & Plan: Visit Diagnoses:  1. Spinal stenosis of lumbosacral region   2. Chronic pain of both knees   3. Unilateral primary osteoarthritis, left knee   4. Unilateral primary osteoarthritis, right knee     Plan: At this point essentially we make a referral to the [facility] bariatric assessment clinic to see what nonoperative modalities can help her with losing weight.  We were able to Place Monovisc in both knees today.  She understands she can get steroid injections in her knees in 3-4 months from now if needed.  All questions concerns were answered and addressed.  Follow-Up Instructions: Return if symptoms worsen or fail to improve.   Orders:  Orders Placed This Encounter  Procedures  . Large Joint Inj   No orders of the defined types were placed in this encounter.     Procedures: Large Joint Inj: bilateral knee on 05/14/2017 10:44 AM Indications: pain and diagnostic evaluation Details: 22 G 1.5 in needle, superolateral approach  Arthrogram: No  Outcome: tolerated well, no immediate complications Procedure, treatment alternatives, risks and benefits explained, specific risks discussed. Consent was given by the patient. Immediately prior to procedure a time out was called to verify the correct patient, procedure, equipment, support staff and site/side marked as required. Patient was prepped and draped in the usual sterile fashion.       Clinical Data: No additional findings.   Subjective: Chief Complaint  Patient presents with  . Left Knee - Follow-up  . Right Knee - Follow-up  Patient is a very pleasant 52 year old female well-known to our office.  She has severe arthritis in both of her knees and has had steroid  injections in her knees in the past multiple times.  She is actually a candidate for hyaluronic acid in both knees today we will try to place Monovisc in both knees today.  She is also here to go over the MRI of her lumbar spine.  This was compared to an MRI of 2015.  At that time she had a large right disc protrusion at L3-L4.  She was seen by the neurosurgeons for this and they did perform a type of surgery.  She is been to therapy.  She is work on weight loss.  She is reluctant to go to any type of bariatric surgical evaluation but she is interested in the [facility] bariatric clinic they can help with weight loss and nutrition assessment.  Her knees hurt daily as well as her back hurts constantly.  A lot of this is related to her weight. HPI  Review of Systems She currently denies any headache, chest pain, shortness of breath, fever, chills, nausea, vomiting.  Objective: Vital Signs: There were no vitals taken for this visit.  Physical Exam She is alert and oriented x3 and in no acute distress she is morbidly obese individual exam.  Both knees have significant obesity with good range of motion but certainly pain on the medial joint line of both knees due to the severity of her arthritis. Ortho Exam  Specialty Comments:  No specialty comments available.  Imaging: No results found. The MRIs are independently reviewed with her.  It does show mild arthritic change in the lumbar spine resolution of her disc at L3-L4.  There is mild foraminal stenosis at this level.  The remainder of her back MRI is unchanged from previous MRI.  PMFS History: Patient Active Problem List   Diagnosis Date Noted  . Unilateral primary osteoarthritis, left knee 08/14/2016  . Unilateral primary osteoarthritis, right knee 08/14/2016  . Chronic pain of both knees 08/14/2016  . REM behavioral disorder 08/14/2016  . Primary osteoarthritis of both knees 06/14/2016  . Allodynia 03/26/2014  . Meralgia paraesthetica  02/26/2014  . Degeneration of lumbar or lumbosacral intervertebral disc 02/26/2014  . Symptomatic mammary hypertrophy 12/16/2013  . Acute bronchitis 04/02/2013  . Flu-like symptoms 04/02/2013  . Blood in stool 02/24/2013  . Iron deficiency anemia, unspecified 02/24/2013  . Asthma with acute exacerbation 12/28/2012  . Post-nasal drainage 09/02/2012  . Arthritis of knee, right 03/18/2012  . Preoperative respiratory examination 03/14/2012  . Preop respiratory exam 02/28/2012  . Acute URI 02/12/2012  . Vitamin D deficiency 09/21/2010  . MORBID OBESITY 10/14/2008  . Obstructive sleep apnea 10/14/2008  . Moderate persistent asthma 06/02/2008   Past Medical History:  Diagnosis Date  . Anemia sept /oct 2014  . Arthritis   . Asthma   . Atrial mass   . Constipation   . Diverticulitis   . Dyspnea   . Environmental allergies   . Frequency of urination   . Gastroenteritis   . GERD (gastroesophageal reflux disease)   . Glaucoma   . Hemorrhoids    external with some bleeding  . Hyperlipidemia   . Hypothyroidism   . Neuropathy   . Obesity   . OSA (obstructive sleep apnea)    wears CPAP night pt does not know settings  . PONV (postoperative nausea and vomiting)    states she gets sick on her stomach after surgery  . Urgency of urination     Family History  Problem Relation Age of Onset  . Heart murmur Mother        rheumatoid arthritis  . Heart attack Mother   . COPD Mother   . Diabetes Mother   . Breast cancer Mother   . Prostate cancer Father   . Kidney failure Father   . Asthma Brother        and mother both as a child    Past Surgical History:  Procedure Laterality Date  . BREAST REDUCTION SURGERY Bilateral 12/16/2013   Procedure: BILATERAL BREAST REDUCTION;  Surgeon: Theodoro Kos, DO;  Location: [facility];  Service: Plastics;  Laterality: Bilateral;  . BREAST REDUCTION SURGERY Bilateral   . CARPAL TUNNEL RELEASE     right  . CARPAL TUNNEL RELEASE Right   .  CHOLECYSTECTOMY  2008  . CHOLECYSTECTOMY    . COLONOSCOPY    . COLONOSCOPY WITH PROPOFOL N/A 02/24/2013   Procedure: COLONOSCOPY WITH PROPOFOL;  Surgeon: Ladene Artist, MD;  Location: [facility];  Service: Endoscopy;  Laterality: N/A;  . KNEE ARTHROSCOPY  03/18/2012   Procedure: ARTHROSCOPY KNEE;  Surgeon: Mcarthur Rossetti, MD;  Location: [facility];  Service: Orthopedics;  Laterality: Right;  Right knee arthroscopy with debridement and three compartment chondroplasty  . polyps removal  2007  . REDUCTION MAMMAPLASTY    . TUBAL LIGATION  1992  . tubal ligaton     Social History   Occupational History  . 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 Smoking status: Former Smoker    Packs/day: 2.00    Years: 7.00    Pack years: 14.00    Types: Cigarettes    Last attempt to quit: 02/12/1990    Years since quitting: 27.2  . Smokeless tobacco: Never Used  Substance and Sexual Activity  . Alcohol use: Yes    Comment: rare  . Drug use: No  . Sexual activity: Not on file

## 2017-06-14 ENCOUNTER — Encounter: Payer: Self-pay | Admitting: Neurology

## 2017-06-14 ENCOUNTER — Ambulatory Visit: Payer: Medicare Other | Admitting: Neurology

## 2017-06-14 VITALS — BP 126/79 | HR 86 | Ht 66.0 in | Wt 375.5 lb

## 2017-06-14 DIAGNOSIS — M25562 Pain in left knee: Secondary | ICD-10-CM

## 2017-06-14 DIAGNOSIS — G4733 Obstructive sleep apnea (adult) (pediatric): Secondary | ICD-10-CM | POA: Diagnosis not present

## 2017-06-14 DIAGNOSIS — G5711 Meralgia paresthetica, right lower limb: Secondary | ICD-10-CM

## 2017-06-14 DIAGNOSIS — G8929 Other chronic pain: Secondary | ICD-10-CM

## 2017-06-14 DIAGNOSIS — R208 Other disturbances of skin sensation: Secondary | ICD-10-CM | POA: Diagnosis not present

## 2017-06-14 DIAGNOSIS — M25561 Pain in right knee: Secondary | ICD-10-CM

## 2017-06-14 DIAGNOSIS — G4752 REM sleep behavior disorder: Secondary | ICD-10-CM

## 2017-06-14 MED ORDER — CLONAZEPAM 1 MG PO TABS: ORAL_TABLET | ORAL | 1 refills | Status: DC

## 2017-06-14 MED ORDER — MODAFINIL 200 MG PO TABS: 200.0000 mg | ORAL_TABLET | Freq: Every day | ORAL | 1 refills | Status: DC

## 2017-06-14 NOTE — Progress Notes (Signed)
[facility]  PATIENT: Melissa Rowe DOB: 1965-11-20   REFERRING CLINICIAN: Dr. [name] HISTORY FROM: Patient  REASON FOR VISIT: Lateral thigh pain   HISTORICAL  CHIEF COMPLAINT:  Chief Complaint  Patient presents with  . Meralgia Paresthetica    Feels her symptoms are kept tolerable with Trileptal, gabapentin and procedures at [facility].   . Difficutly Sleeping    She is taking clonazepam at night but is still waking up every 2-3 hours.  The disruption in her sleep causes her to be tired the following day.    HISTORY OF PRESENT ILLNESS:  Melissa Rowe is a 52 yo woman with right anterolateral thigh numbness and pain due to a Lateral femoral cutaneous neuropathy and a sleep disorder.     Update 06/14/2017: LFCN:   The right leg pain improved with medication and improved further with an RFA or similar procedure by [facility].   The LFCN anterior region is numb and pain free abut the more lateral distribution has dysesthesia and pain.   She tolerates the medi   Sleep issues:    She is having active dreams, helped by clonazepam.    She talks in her dreams a lot and other times seems to be running in bed, arguing and punching.   She dozes off a lot and takes naps most days.   She has OSA on CPAP ([place] [facility]).    She had a few episodes of sleep paralysis but none in last year.   No cataplexy.     EPWORTH SLEEPINESS SCALE  On a scale of 0 - 3 what is the chance of dozing:  Sitting and Reading:   3 Watching TV:    3 Sitting inactive in a public place: 2 Passenger in car for one hour: 1 Lying down to rest in the afternoon: 3 Sitting and talking to someone: 1 Sitting quietly after lunch:  3 In a car, stopped in traffic:   Does not drive   Total (out of 24):      16/24    Moderately severe sleepiness       From 08/14/2016: The lateral femoral cutaneous nerve pain improved on oxcarbazepine 600 mg by mouth twice a day with gabapentin 4  times a day. However, there was still a fair amount of residual pain due to the dysesthesia and allodynia.   She tolerates oxcarbazepine and gabapentin well at her current doses. The past she had more difficulty tolerating Lyrica (severe weight gain and strange dreams) and lamotrigine (felt dizzy).    She had a couple of injections at [facility] and has done better.    She is going to have another LFCN procedure at [facility].     Much of the distribution of the LFCN is now numb but there is still a painful patch (most posterolateral).    The lidocaine ointment helps this pain some.     She has had nightmare dreams the past 2 several months.   She screams things like 'Run Run'.   Dreams are usually between 2-4 am.  She moves her legs some but has not punched or kicked or gotten out of bed.  This is now happening most nights.       She denies any muscle rigidity, gait changes.  She has had a couple episodes of sleep paralysis this year.   Maybe one episode of hypnagogic hallucination.    She had never had cataplexy   She has  OSA and is on CPAP.   She is often sleepy.      LFCN History:   She had breast reduction surgery on November 4th and she reports that the right thigh was 'going to sleep' with numbness and tingling later that night.    Over the next week it got more painful and by December, the pain was very severe.  Pain was apparently so bad that she was screaming out in the middle of the night. She describes a gnawing pain that is constant but also has a stabbing pain frequently.   She was worked in at her Primary care provider's office, Dr. [name], and was diagnosed with right meralgia paresthetica.   Lyrica was started.  She saw neurosurgery and the Lyrica dose was increased from 75  to 150 mg twice a day with only minimal relief. She returned to her primary care provider's office on 02/16/2014 and tramadol was added.    Of note, she gained about 40 pounds during the 4 weeks between her two primary  care visits.   On the higher dose of Lyrica, the stabbing pain was helped but the more annoying constant pain was not. She was switched from Lyrica to gabapentin with benefit (02/2014).      Lamotrigine was not tolerated (added 03/2014).   Lidocaine ointment helps some (added 2/20165).    Oxcabazepine added 09/2014 with improvement, dose slwowly titrated to 600 mg po bid.  around May 2017 she had a procedure at [facility] with improvement of the anterior half of the painful region but not the lateral half.[name]        REVIEW OF SYSTEMS:  Constitutional: No fevers, chills, sweats, or change in appetite Eyes: No visual changes, double vision, eye pain Ear, nose and throat: No hearing loss, ear pain, nasal congestion, sore throat Cardiovascular: No chest pain, palpitations Respiratory:  No shortness of breath at rest or with exertion.   No wheezes.    She has OSA and is on CPAP. GastrointestinaI: No nausea, vomiting, diarrhea, abdominal pain, fecal incontinence Genitourinary:  No dysuria, urinary retention or frequency.  No nocturia. Musculoskeletal:  No neck pain, some back pain, moderate knee pain   Integumentary: No rash, pruritus, skin lesions Neurological: as above  ALLERGIES: Allergies  Allergen Reactions  . Lamictal [Lamotrigine] Other (See Comments)    headache     HOME MEDICATIONS: Outpatient Medications Prior to Visit  Medication Sig Dispense Refill  . albuterol (PROVENTIL) (2.5 MG/3ML) 0.083% nebulizer solution USE 1 VIAL IN NEBULIZER EVERY 6 HOURS FOR SHORTNESS OF BREATH/WHEEZING AND AS NEEDED 30 vial 10  . calcium-vitamin D (OSCAL WITH D) 500-200 MG-UNIT per tablet Take 1 tablet by mouth daily.     . cyclobenzaprine (FLEXERIL) 10 MG tablet Take 1 tablet (10 mg total) by mouth 2 (two) times daily as needed for muscle spasms. 20 tablet 0  . ferrous sulfate 325 (65 FE) MG tablet Take 325 mg by mouth daily with breakfast.    . gabapentin (NEURONTIN) 800 MG tablet Take 1  tablet four times daily.  Please call [phone] to schedule follow up. 360 tablet 1  . levothyroxine (SYNTHROID, LEVOTHROID) 100 MCG tablet Take 100 mcg by mouth daily before breakfast. Reported on 02/16/2015    . lidocaine (XYLOCAINE) 5 % ointment Dispense 2 x 50 g tubes  Apply to right thigh twice a day 100 g 3  . mometasone-formoterol (DULERA) 200-5 MCG/ACT AERO Inhale 2 puffs into the lungs 2 (two) times daily.  1 Inhaler 3  . montelukast (SINGULAIR) 10 MG tablet Take 10 mg by mouth daily with breakfast.    . Multiple Vitamin (MULTIVITAMIN) capsule Take 1 capsule by mouth daily.     Earney Navy Bicarbonate (ZEGERID OTC PO) Take 1 tablet by mouth daily.    . Oxcarbazepine (TRILEPTAL) 300 MG tablet Take 2 tablets twice daily.  Please call [phone] to schedule appt. 360 tablet 0  . polyethylene glycol (MIRALAX / GLYCOLAX) packet Take 17 g by mouth 2 (two) times daily.     . traMADol (ULTRAM) 50 MG tablet Take 50 mg by mouth 3 (three) times daily as needed for moderate pain.    . clonazePAM (KLONOPIN) 0.5 MG tablet TAKE 1/2 TO 1 (ONE-HALF TO ONE) TABLET BY MOUTH AT BEDTIME 30 tablet 2  . furosemide (LASIX) 40 MG tablet Take 40 mg by mouth daily.     [name] spironolactone (ALDACTONE) 25 MG tablet Take 25 mg by mouth daily.     No facility-administered medications prior to visit.     PAST MEDICAL HISTORY: Past Medical History:  Diagnosis Date  . Anemia sept /oct 2014  . Arthritis   . Asthma   . Atrial mass   . Constipation   . Diverticulitis   . Dyspnea   . Environmental allergies   . Frequency of urination   . Gastroenteritis   . GERD (gastroesophageal reflux disease)   . Glaucoma   . Hemorrhoids    external with some bleeding  . Hyperlipidemia   . Hypothyroidism   . Neuropathy   . Obesity   . OSA (obstructive sleep apnea)    wears CPAP night pt does not know settings  . PONV (postoperative nausea and vomiting)    states she gets sick on her stomach after surgery  .  Urgency of urination     PAST SURGICAL HISTORY: Past Surgical History:  Procedure Laterality Date  . BREAST REDUCTION SURGERY Bilateral 12/16/2013   Procedure: BILATERAL BREAST REDUCTION;  Surgeon: Theodoro Kos, DO;  Location: [place];  Service: Plastics;  Laterality: Bilateral;  . BREAST REDUCTION SURGERY Bilateral   . CARPAL TUNNEL RELEASE     right  . CARPAL TUNNEL RELEASE Right   . CHOLECYSTECTOMY  2008  . CHOLECYSTECTOMY    . COLONOSCOPY    . COLONOSCOPY WITH PROPOFOL N/A 02/24/2013   Procedure: COLONOSCOPY WITH PROPOFOL;  Surgeon: Ladene Artist, MD;  Location: [facility];  Service: Endoscopy;  Laterality: N/A;  . KNEE ARTHROSCOPY  03/18/2012   Procedure: ARTHROSCOPY KNEE;  Surgeon: Mcarthur Rossetti, MD;  Location: [place];  Service: Orthopedics;  Laterality: Right;  Right knee arthroscopy with debridement and three compartment chondroplasty  . polyps removal  2007  . REDUCTION MAMMAPLASTY    . TUBAL LIGATION  1992  . tubal ligaton      FAMILY HISTORY: Family History  Problem Relation Age of Onset  . Heart murmur Mother        rheumatoid arthritis  . Heart attack Mother   . COPD Mother   . Diabetes Mother   . Breast cancer Mother   . Prostate cancer Father   . Kidney failure Father   . Asthma Brother        and mother both as a child    SOCIAL HISTORY:  Social History   Socioeconomic History  . Marital status: Married    Spouse name: Not on file  . Number of children: 2  . Years of education: Not  on file  . Highest education level: Not on file  Occupational History  . Occupation: cna    Comment: at [employer] Secretary/administrator: UNEMPLOYED  Social Needs  . Financial resource strain: Not on file  . Food insecurity:    Worry: Not on file    Inability: Not on file  . Transportation needs:    Medical: Not on file    Non-medical: Not on file  Tobacco Use  . Smoking status: Former Smoker    Packs/day: 2.00    Years: 7.00     Pack years: 14.00    Types: Cigarettes    Last attempt to quit: 02/12/1990    Years since quitting: 27.3  . Smokeless tobacco: Never Used  Substance and Sexual Activity  . Alcohol use: Yes    Comment: rare  . Drug use: No  . Sexual activity: Not on file  Lifestyle  . Physical activity:    Days per week: Not on file    Minutes per session: Not on file  . Stress: Not on file  Relationships  . Social connections:    Talks on phone: Not on file    Gets together: Not on file    Attends religious service: Not on file    Active member of club or organization: Not on file    Attends meetings of clubs or organizations: Not on file    Relationship status: Not on file  . Intimate partner violence:    Fear of current or ex partner: Not on file    Emotionally abused: Not on file    Physically abused: Not on file    Forced sexual activity: Not on file  Other Topics Concern  . Not on file  Social History Narrative  . Not on file     PHYSICAL EXAM  Vitals:   06/14/17 0901  BP: 126/79  Pulse: 86  Weight: (!) 375 lb 8 oz (170.3 kg)  Height: 5\' 6"  (1.676 m)    Body mass index is 60.61 kg/m.   General: The patient Is an obese woman in no acute distress.   Neurologic Exam  Mental status: The patient is alert and oriented x 3 at the time of the examination. The patient has apparent normal recent and remote memory, with an apparently normal attention span and concentration ability.   Speech is normal.  Cranial nerves: Extraocular movements are full.  Facial strength is normal.  Trapezius and sternocleidomastoid strength is normal. No dysarthria is noted.     Motor:  Muscle bulk and tone are normal. Strength is  5 / 5 in all 4 extremities.   Sensory:    She has reduced sensation to touch in the anterolateral right thigh.  Anteriorly, she is numb without allodynia or hyperpathia.  More laterally in the LFCN distribution she has reduced sensation while at the same time has allodynia  and hyperpathia she has normal sensation to touch elsewhere..  Gait and station: Station is normal but the gait is arthritic with a reduced stride.  (She has significant knee arthritis)   Reflexes: Deep tendon reflexes are symmetric and normal bilaterally.     DIAGNOSTIC DATA (LABS, IMAGING, TESTING) - I reviewed patient records, labs, notes, testing and imaging myself where available.  Lab Results  Component Value Date   WBC 12.6 (H) 04/29/2015   HGB 13.1 04/29/2015   HCT 39.4 04/29/2015   MCV 62.9 (L) 04/29/2015   PLT 328 04/29/2015  Component Value Date/Time   NA 140 04/29/2015 1338   K 4.9 04/29/2015 1338   CL 104 04/29/2015 1338   CO2 22 04/29/2015 1338   GLUCOSE 94 04/29/2015 1338   BUN 17 04/29/2015 1338   CREATININE 0.83 04/29/2015 1338   CALCIUM 9.4 04/29/2015 1338   PROT 8.0 12/16/2013 0910   ALBUMIN 3.7 12/16/2013 0910   AST 13 12/16/2013 0910   ALT 13 12/16/2013 0910   ALKPHOS 75 12/16/2013 0910   BILITOT 0.2 (L) 12/16/2013 0910   GFRNONAA >60 04/29/2015 1338   GFRAA >60 04/29/2015 1338   Lab Results  Component Value Date   CHOL 209 (H) 09/22/2009   HDL 62 09/22/2009   LDLCALC 116 (H) 09/22/2009   TRIG 156 (H) 09/22/2009   CHOLHDL 3.4 Ratio 09/22/2009   Lab Results  Component Value Date   HGBA1C 5.7 (H) 12/16/2013   No results found for: VITAMINB12 Lab Results  Component Value Date   TSH 6.759 (H) 01/11/2009    CLINICAL DATA: Chronic low back pain for 7 years  EXAM: MRI LUMBAR SPINE WITHOUT CONTRAST  TECHNIQUE: Multiplanar, multisequence MR imaging was performed. No intravenous contrast was administered.  COMPARISON: 12/10/2006.  FINDINGS: There is a mild levoconvex curve of the lumbar spine with the apex at L3. The spinal cord terminates at the L1 level. The paraspinal soft tissues are within normal limits. Marrow signal is normal.  T11-T12 and T12-L1 appear normal.  L1-L2: Negative.  L2-L3: Mild disc desiccation  with shallow broad-based central protrusion but no resulting stenosis.  L3-L4: Disc desiccation and mild loss of height. Right foraminal protrusion is present with associated annular tear. This produces very mild right foraminal stenosis with the protrusion contacting exiting right L3 nerve in the foramen. The left neural foramen and central canal are patent. Disc protrusion just contacts the descending right L4 nerve as it approaches the lateral recess.  L4-L5: Disc desiccation. Central annular tear with right paracentral protrusion. Very mild central stenosis. Mild encroachment on the right lateral recess and descending right L5 nerve. The neural foramina appear adequately patent.  L5-S1: Degenerated disc with loss of height. Vacuum disc. Central canal patent. Foramina appear adequately patent. Right paracentral broad-based protrusion is present mildly encroaching on the right S1 nerve in the lateral recess.  IMPRESSION: 1. L3-L4 right foraminal annular tear and protrusion with mild right foraminal stenosis potentially affecting the right L3 nerve. 2. L4-L5 annular tear and right paracentral protrusion with mild central stenosis and mild encroachment on the descending right L5 nerve. 3. L5-S1 disc degeneration with broad-based right paracentral protrusion minimally encroaching on the right lateral recess.   Electronically Signed  By: Dereck Ligas M.D.  On: 06/04/2013 08:03     ASSESSMENT AND PLAN  Meralgia paresthetica of right side  Obstructive sleep apnea  Allodynia  Chronic pain of both knees  REM behavioral disorder   1.  She will continue oxcarbazepine and gabapentin for her pain.  We discussed going back to the [facility] for another radiofrequency ablation to see if the lateral portion of the LFCN nerve would benefit from a second procedure  2.     She has REM behavior disorder mildly helped by low-dose clonazepam.  I will increase  the dose to 1 mg. 3.    She also has hypersomnia.  I am uncertain if this is due to a disorder such as narcolepsy without cataplexy or idiopathic hypersomnia.  Alternatively this could also be due to her OSA.  She will start Provigil 200 mg daily.  I will consider an MSLT if there is not much of a response. She will return to see me in 6 months or sooner if she has new or worsening neurologic symptoms.   Armand Preast A. Felecia Shelling, MD, PhD 06/20/9772, 14:23 AM Certified in Neurology, Clinical Neurophysiology, Sleep Medicine, Pain Medicine and Neuroimaging  [facility] [address] [place], [place] [address] [phone]

## 2017-06-17 ENCOUNTER — Telehealth: Payer: Self-pay

## 2017-06-17 NOTE — Telephone Encounter (Signed)
We received a prior authorization request for this medication. I have completed and submitted the PA on Cover My Meds and should have a determination within 48-72 hours.  Cover My Meds Key: [id number] - PA Case ID: [id number]

## 2017-06-19 NOTE — Telephone Encounter (Signed)
LMOM that per her ins. co., Modafinil is a covered med.  Apparently her pharmacy ran rx. for brand name Provigil.  They just need to run rx. for generic Modafinil.  If she has questions, please return this call.  I am happy to let the pharmacy know they need to run rx. for generic, I just need to know which pharmacy she took rx. to/fim

## 2017-06-19 NOTE — Telephone Encounter (Signed)
DENIED. It looks like pharmacy may have ran it for brand name because insurance is saying that the modafinil is preferred. Ok to switch or does patient need brand only?  Per Insurance:  PROVIGIL is not on the drug list (formulary). Your plan does not cover this drug unless you have previously tried and failed drugs covered on the formulary that are clinical alternatives for your condition OR if your doctor submits documentation to indicate that these other drugs are not clinically appropriate to treat your condition. Brand Provigil is denied because it is not on your plan's Drug List (formulary). You need to first try modafinil*. OR your doctor needs to give [place] specific medical reasons why each of the covered drug(s) is not appropriate for you.

## 2017-06-20 ENCOUNTER — Encounter (INDEPENDENT_AMBULATORY_CARE_PROVIDER_SITE_OTHER): Payer: Medicare Other

## 2017-06-26 ENCOUNTER — Telehealth: Payer: Self-pay | Admitting: *Deleted

## 2017-06-26 NOTE — Telephone Encounter (Signed)
PA for Modafinil 200mg  tablets #30/30 completed and faxed to [facility] fax# [phone]. Dx: OSA (G47.33), Fatigue (R53.82)/fim

## 2017-07-04 NOTE — Telephone Encounter (Signed)
Fax received from [facility] thru [facility], phone# [phone].  Modafinil approved for dates 06/19/17 thru 12/29/17.  Case# [id number] BN/fim

## 2017-07-15 ENCOUNTER — Telehealth (INDEPENDENT_AMBULATORY_CARE_PROVIDER_SITE_OTHER): Payer: Self-pay

## 2017-07-15 NOTE — Telephone Encounter (Signed)
PA approval number for [id number]- [id number].

## 2017-08-16 ENCOUNTER — Other Ambulatory Visit: Payer: Self-pay | Admitting: Neurology

## 2017-08-27 ENCOUNTER — Other Ambulatory Visit: Payer: Self-pay | Admitting: Neurology

## 2017-09-19 ENCOUNTER — Telehealth (INDEPENDENT_AMBULATORY_CARE_PROVIDER_SITE_OTHER): Payer: Self-pay | Admitting: Physician Assistant

## 2017-09-19 NOTE — Telephone Encounter (Signed)
Patient requesting bilateral knee cortisone injections at her appt 8/14.

## 2017-09-25 ENCOUNTER — Ambulatory Visit (INDEPENDENT_AMBULATORY_CARE_PROVIDER_SITE_OTHER): Payer: Medicare Other | Admitting: Physician Assistant

## 2017-09-25 ENCOUNTER — Encounter (INDEPENDENT_AMBULATORY_CARE_PROVIDER_SITE_OTHER): Payer: Self-pay | Admitting: Physician Assistant

## 2017-09-25 DIAGNOSIS — M170 Bilateral primary osteoarthritis of knee: Secondary | ICD-10-CM

## 2017-09-25 MED ORDER — LIDOCAINE HCL 1 % IJ SOLN
0.5000 mL | INTRAMUSCULAR | Status: AC | PRN
Start: 2017-09-25 — End: 2017-09-25
  Administered 2017-09-25: .5 mL

## 2017-09-25 MED ORDER — LIDOCAINE HCL 1 % IJ SOLN
0.5000 mL | INTRAMUSCULAR | Status: AC | PRN
  Administered 2017-09-25: .5 mL

## 2017-09-25 MED ORDER — METHYLPREDNISOLONE ACETATE 40 MG/ML IJ SUSP
40.0000 mg | INTRAMUSCULAR | Status: AC | PRN
  Administered 2017-09-25: 40 mg via INTRA_ARTICULAR

## 2017-09-25 NOTE — Progress Notes (Signed)
   Procedure Note  Patient: Melissa Rowe             Date of Birth: [birth year]           MRN: [id number]             Visit Date: 09/25/2017  HPI: Melissa Rowe returns today follow-up of bilateral knees.  She has known severe arthritis of both knees.  Last seen in April and was given Monovisc injections both knees.  Still getting some relief but he is having increasing pain would like cortisone injections both knees.  She has had no known injury to either knee.  Physical exam: Bilateral knees she has tenderness along medial lateral joint lines.  No abnormal warmth erythema either knee.   Procedures: Visit Diagnoses: Primary osteoarthritis of both knees - Plan: Large Joint Inj: bilateral knee  Large Joint Inj: bilateral knee on 09/25/2017 3:57 PM Indications: pain Details: 22 G 1.5 in needle, anterolateral approach  Arthrogram: No  Medications (Right): 0.5 mL lidocaine 1 %; 40 mg methylPREDNISolone acetate 40 MG/ML Medications (Left): 0.5 mL lidocaine 1 %; 40 mg methylPREDNISolone acetate 40 MG/ML Outcome: tolerated well, no immediate complications Procedure, treatment alternatives, risks and benefits explained, specific risks discussed. Consent was given by the patient. Immediately prior to procedure a time out was called to verify the correct patient, procedure, equipment, support staff and site/side marked as required. Patient was prepped and draped in the usual sterile fashion.    Plan: She will call our office in late October to set up repeat supplemental injections of both knees.  Questions were encouraged and answered.

## 2017-11-07 ENCOUNTER — Other Ambulatory Visit: Payer: Self-pay | Admitting: Neurology

## 2017-11-11 ENCOUNTER — Other Ambulatory Visit: Payer: Self-pay | Admitting: Neurology

## 2017-11-21 ENCOUNTER — Other Ambulatory Visit: Payer: Self-pay | Admitting: Internal Medicine

## 2017-11-21 DIAGNOSIS — D242 Benign neoplasm of left breast: Secondary | ICD-10-CM

## 2017-12-14 ENCOUNTER — Other Ambulatory Visit: Payer: Self-pay | Admitting: Neurology

## 2017-12-16 ENCOUNTER — Encounter: Payer: Self-pay | Admitting: Neurology

## 2017-12-16 ENCOUNTER — Ambulatory Visit: Payer: Medicare Other | Admitting: Neurology

## 2017-12-16 VITALS — BP 134/88 | HR 87 | Ht 66.0 in | Wt 369.5 lb

## 2017-12-16 DIAGNOSIS — G4733 Obstructive sleep apnea (adult) (pediatric): Secondary | ICD-10-CM | POA: Diagnosis not present

## 2017-12-16 DIAGNOSIS — G5711 Meralgia paresthetica, right lower limb: Secondary | ICD-10-CM | POA: Diagnosis not present

## 2017-12-16 DIAGNOSIS — G4752 REM sleep behavior disorder: Secondary | ICD-10-CM

## 2017-12-16 DIAGNOSIS — R208 Other disturbances of skin sensation: Secondary | ICD-10-CM | POA: Diagnosis not present

## 2017-12-16 DIAGNOSIS — G4719 Other hypersomnia: Secondary | ICD-10-CM

## 2017-12-16 MED ORDER — OXCARBAZEPINE 300 MG PO TABS: 600.0000 mg | ORAL_TABLET | Freq: Two times a day (BID) | ORAL | 3 refills | Status: DC

## 2017-12-16 MED ORDER — MODAFINIL 200 MG PO TABS: 200.0000 mg | ORAL_TABLET | Freq: Every day | ORAL | 1 refills | Status: DC

## 2017-12-16 MED ORDER — LIDOCAINE 5 % EX OINT: TOPICAL_OINTMENT | CUTANEOUS | 3 refills | Status: DC

## 2017-12-16 MED ORDER — GABAPENTIN 800 MG PO TABS: ORAL_TABLET | ORAL | 3 refills | Status: DC

## 2017-12-16 MED ORDER — CLONAZEPAM 1 MG PO TABS: ORAL_TABLET | ORAL | 1 refills | Status: DC

## 2017-12-16 NOTE — Progress Notes (Signed)
[facility]  PATIENT: Melissa Rowe DOB: 03-26-1965   REFERRING CLINICIAN: Dr. [name] HISTORY FROM: Patient  REASON FOR VISIT: Lateral thigh pain   HISTORICAL  CHIEF COMPLAINT:  Chief Complaint  Patient presents with  . Follow-up    Room 13.     HISTORY OF PRESENT ILLNESS:  Melissa Rowe is a 52 y.o. woman with right anterolateral thigh numbness and pain due to a Lateral femoral cutaneous neuropathy and a sleep disorder.     Update 12/16/2017:   Her LFCN pain is doing fairly well.   The lidocaine ointment helped along with the gabapentin and oxcarbazepine.          She falls asleep easily on her medications.   She has OSA and is on CPAP.    Clonazepam helps her sleep and she has no hangover in the morning.  Her excessive daytime sleepiness is better with modafinil.     She sometimes takes a nap with CPAP (and sometimes if she dozes off while watching TV, without CPAP).     She always feels better after a nap.    Her active dreams are much better with clonazepam but she still sometimes talks in her sleep.    She used to yell out but now just has talking.      Update 06/14/2017: LFCN:   The right leg pain improved with medication and improved further with an RFA or similar procedure by [facility].   The LFCN anterior region is numb and pain free abut the more lateral distribution has dysesthesia and pain.   She tolerates the medications  Sleep issues:    She is having active dreams, helped by clonazepam.    She talks in her dreams a lot and other times seems to be running in bed, arguing and punching.   She dozes off a lot and takes naps most days.   She has OSA on CPAP ([facility]).    She had a few episodes of sleep paralysis but none in last year.   No cataplexy.     EPWORTH SLEEPINESS SCALE  On a scale of 0 - 3 what is the chance of dozing:  Sitting and Reading:   3 Watching TV:    3 Sitting inactive in a public place: 2 Passenger in car  for one hour: 1 Lying down to rest in the afternoon: 3 Sitting and talking to someone: 1 Sitting quietly after lunch:  3 In a car, stopped in traffic:   Does not drive   Total (out of 24):      16/24    Moderately severe sleepiness       From 08/14/2016: The lateral femoral cutaneous nerve pain improved on oxcarbazepine 600 mg by mouth twice a day with gabapentin 4 times a day. However, there was still a fair amount of residual pain due to the dysesthesia and allodynia.   She tolerates oxcarbazepine and gabapentin well at her current doses. The past she had more difficulty tolerating Lyrica (severe weight gain and strange dreams) and lamotrigine (felt dizzy).    She had a couple of injections at [facility] and has done better.    She is going to have another LFCN procedure at [facility].     Much of the distribution of the LFCN is now numb but there is still a painful patch (most posterolateral).    The lidocaine ointment helps this pain some.     She has had  nightmare dreams the past 2 several months.   She screams things like 'Run Run'.   Dreams are usually between 2-4 am.  She moves her legs some but has not punched or kicked or gotten out of bed.  This is now happening most nights.       She denies any muscle rigidity, gait changes.  She has had a couple episodes of sleep paralysis this year.   Maybe one episode of hypnagogic hallucination.    She had never had cataplexy   She has OSA and is on CPAP.   She is often sleepy.      LFCN History:   She had breast reduction surgery on November 4th and she reports that the right thigh was 'going to sleep' with numbness and tingling later that night.    Over the next week it got more painful and by December, the pain was very severe.  Pain was apparently so bad that she was screaming out in the middle of the night. She describes a gnawing pain that is constant but also has a stabbing pain frequently.   She was worked in at her Primary care provider's  office, Dr. [name], and was diagnosed with right meralgia paresthetica.   Lyrica was started.  She saw neurosurgery and the Lyrica dose was increased from 75  to 150 mg twice a day with only minimal relief. She returned to her primary care provider's office on 02/16/2014 and tramadol was added.    Of note, she gained about 40 pounds during the 4 weeks between her two primary care visits.   On the higher dose of Lyrica, the stabbing pain was helped but the more annoying constant pain was not. She was switched from Lyrica to gabapentin with benefit (02/2014).      Lamotrigine was not tolerated (added 03/2014).   Lidocaine ointment helps some (added 2/20165).    Oxcabazepine added 09/2014 with improvement, dose slwowly titrated to 600 mg po bid.  around May 2017 she had a procedure at [facility] with improvement of the anterior half of the painful region but not the lateral half.[name]        REVIEW OF SYSTEMS:  Constitutional: No fevers, chills, sweats, or change in appetite Eyes: No visual changes, double vision, eye pain Ear, nose and throat: No hearing loss, ear pain, nasal congestion, sore throat Cardiovascular: No chest pain, palpitations Respiratory:  No shortness of breath at rest or with exertion.   No wheezes.    She has OSA and is on CPAP. GastrointestinaI: No nausea, vomiting, diarrhea, abdominal pain, fecal incontinence Genitourinary:  No dysuria, urinary retention or frequency.  No nocturia. Musculoskeletal:  No neck pain, some back pain, moderate knee pain   Integumentary: No rash, pruritus, skin lesions Neurological: as above  ALLERGIES: Allergies  Allergen Reactions  . Lamictal [Lamotrigine] Other (See Comments)    headache     HOME MEDICATIONS: Outpatient Medications Prior to Visit  Medication Sig Dispense Refill  . albuterol (PROVENTIL) (2.5 MG/3ML) 0.083% nebulizer solution USE 1 VIAL IN NEBULIZER EVERY 6 HOURS FOR SHORTNESS OF BREATH/WHEEZING AND AS NEEDED 30 vial 10    . calcium-vitamin D (OSCAL WITH D) 500-200 MG-UNIT per tablet Take 1 tablet by mouth daily.     . cyclobenzaprine (FLEXERIL) 10 MG tablet Take 1 tablet (10 mg total) by mouth 2 (two) times daily as needed for muscle spasms. 20 tablet 0  . ferrous sulfate 325 (65 FE) MG tablet Take 325 mg  by mouth daily with breakfast.    . levothyroxine (SYNTHROID, LEVOTHROID) 100 MCG tablet Take 100 mcg by mouth daily before breakfast. Reported on 02/16/2015    . levothyroxine (SYNTHROID, LEVOTHROID) 175 MCG tablet     . mometasone-formoterol (DULERA) 200-5 MCG/ACT AERO Inhale 2 puffs into the lungs 2 (two) times daily. 1 Inhaler 3  . montelukast (SINGULAIR) 10 MG tablet Take 10 mg by mouth daily with breakfast.    . Multiple Vitamin (MULTIVITAMIN) capsule Take 1 capsule by mouth daily.     E[place] Bicarbonate (ZEGERID OTC PO) Take 1 tablet by mouth daily.    . polyethylene glycol (MIRALAX / GLYCOLAX) packet Take 17 g by mouth 2 (two) times daily.     . traMADol (ULTRAM) 50 MG tablet Take 50 mg by mouth 3 (three) times daily as needed for moderate pain.    . clonazePAM (KLONOPIN) 1 MG tablet TAKE 1 TABLET BY MOUTH AT BEDTIME 90 tablet 1  . gabapentin (NEURONTIN) 800 MG tablet TAKE 1 TABLET 4 TIMES  DAILY. PLEASE CALL [phone]  [phone] TO SCHEDULE FOLLOW UP. 360 tablet 1  . lidocaine (XYLOCAINE) 5 % ointment Dispense 2 x 50 g tubes  Apply to right thigh twice a day 100 g 3  . modafinil (PROVIGIL) 200 MG tablet Take 1 tablet (200 mg total) by mouth daily. 90 tablet 1  . Oxcarbazepine (TRILEPTAL) 300 MG tablet TAKE 2 TABLETS BY MOUTH 2  TIMES DAILY. 360 tablet 0   No facility-administered medications prior to visit.     PAST MEDICAL HISTORY: Past Medical History:  Diagnosis Date  . Anemia sept /oct 2014  . Arthritis   . Asthma   . Atrial mass   . Constipation   . Diverticulitis   . Dyspnea   . Environmental allergies   . Frequency of urination   . Gastroenteritis   . GERD (gastroesophageal  reflux disease)   . Glaucoma   . Hemorrhoids    external with some bleeding  . Hyperlipidemia   . Hypothyroidism   . Neuropathy   . Obesity   . OSA (obstructive sleep apnea)    wears CPAP night pt does not know settings  . PONV (postoperative nausea and vomiting)    states she gets sick on her stomach after surgery  . Urgency of urination     PAST SURGICAL HISTORY: Past Surgical History:  Procedure Laterality Date  . BREAST REDUCTION SURGERY Bilateral 12/16/2013   Procedure: BILATERAL BREAST REDUCTION;  Surgeon: Theodoro Kos, DO;  Location: [facility];  Service: Plastics;  Laterality: Bilateral;  . BREAST REDUCTION SURGERY Bilateral   . CARPAL TUNNEL RELEASE     right  . CARPAL TUNNEL RELEASE Right   . CHOLECYSTECTOMY  2008  . CHOLECYSTECTOMY    . COLONOSCOPY    . COLONOSCOPY WITH PROPOFOL N/A 02/24/2013   Procedure: COLONOSCOPY WITH PROPOFOL;  Surgeon: Ladene Artist, MD;  Location: [facility];  Service: Endoscopy;  Laterality: N/A;  . KNEE ARTHROSCOPY  03/18/2012   Procedure: ARTHROSCOPY KNEE;  Surgeon: Mcarthur Rossetti, MD;  Location: [facility];  Service: Orthopedics;  Laterality: Right;  Right knee arthroscopy with debridement and three compartment chondroplasty  . polyps removal  2007  . REDUCTION MAMMAPLASTY    . TUBAL LIGATION  1992  . tubal ligaton      FAMILY HISTORY: Family History  Problem Relation Age of Onset  . Heart murmur Mother        rheumatoid arthritis  .  Heart attack Mother   . COPD Mother   . Diabetes Mother   . Breast cancer Mother   . Prostate cancer Father   . Kidney failure Father   . Asthma Brother        and mother both as a child    SOCIAL HISTORY:  Social History   Socioeconomic History  . Marital status: Married    Spouse name: Not on file  . Number of children: 2  . Years of education: Not on file  . Highest education level: Not on file  Occupational History  . Occupation: cna    Comment: at [employer]  Secretary/administrator: UNEMPLOYED  Social Needs  . Financial resource strain: Not on file  . Food insecurity:    Worry: Not on file    Inability: Not on file  . Transportation needs:    Medical: Not on file    Non-medical: Not on file  Tobacco Use  . Smoking status: Former Smoker    Packs/day: 2.00    Years: 7.00    Pack years: 14.00    Types: Cigarettes    Last attempt to quit: 02/12/1990    Years since quitting: 27.8  . Smokeless tobacco: Never Used  Substance and Sexual Activity  . Alcohol use: Yes    Comment: rare  . Drug use: No  . Sexual activity: Not on file  Lifestyle  . Physical activity:    Days per week: Not on file    Minutes per session: Not on file  . Stress: Not on file  Relationships  . Social connections:    Talks on phone: Not on file    Gets together: Not on file    Attends religious service: Not on file    Active member of club or organization: Not on file    Attends meetings of clubs or organizations: Not on file    Relationship status: Not on file  . Intimate partner violence:    Fear of current or ex partner: Not on file    Emotionally abused: Not on file    Physically abused: Not on file    Forced sexual activity: Not on file  Other Topics Concern  . Not on file  Social History Narrative  . Not on file     PHYSICAL EXAM  Vitals:   12/16/17 1112  BP: 134/88  Pulse: 87  Weight: (!) 369 lb 8 oz (167.6 kg)  Height: 5\' 6"  (1.676 m)    Body mass index is 59.64 kg/m.   General: The patient Is an obese woman in no acute distress.   Neurologic Exam  Mental status: The patient is alert and oriented x 3 at the time of the examination. The patient has apparent normal recent and remote memory, with an apparently normal attention span and concentration ability.   Speech is normal.  Cranial nerves: Extraocular movements are full.  Facial strength is normal.  Trapezius and sternocleidomastoid strength is normal. No dysarthria is noted.      Motor:  Muscle bulk and tone are normal. Strength is  5 / 5 in all 4 extremities.   Sensory:    She has reduced sensation to touch in the anterolateral right thigh.  Anteriorly, she is numb without allodynia or hyperpathia.  More laterally in the LFCN distribution she has reduced sensation while at the same time has allodynia and hyperpathia she has normal sensation to touch elsewhere..  Gait and station:  Station is normal but the gait is arthritic with a reduced stride.  (She has significant knee arthritis)   Reflexes: Deep tendon reflexes are symmetric and normal bilaterally.     DIAGNOSTIC DATA (LABS, IMAGING, TESTING) - I reviewed patient records, labs, notes, testing and imaging myself where available.  Lab Results  Component Value Date   WBC 12.6 (H) 04/29/2015   HGB 13.1 04/29/2015   HCT 39.4 04/29/2015   MCV 62.9 (L) 04/29/2015   PLT 328 04/29/2015      Component Value Date/Time   NA 140 04/29/2015 1338   K 4.9 04/29/2015 1338   CL 104 04/29/2015 1338   CO2 22 04/29/2015 1338   GLUCOSE 94 04/29/2015 1338   BUN 17 04/29/2015 1338   CREATININE 0.83 04/29/2015 1338   CALCIUM 9.4 04/29/2015 1338   PROT 8.0 12/16/2013 0910   ALBUMIN 3.7 12/16/2013 0910   AST 13 12/16/2013 0910   ALT 13 12/16/2013 0910   ALKPHOS 75 12/16/2013 0910   BILITOT 0.2 (L) 12/16/2013 0910   GFRNONAA >60 04/29/2015 1338   GFRAA >60 04/29/2015 1338   Lab Results  Component Value Date   CHOL 209 (H) 09/22/2009   HDL 62 09/22/2009   LDLCALC 116 (H) 09/22/2009   TRIG 156 (H) 09/22/2009   CHOLHDL 3.4 Ratio 09/22/2009   Lab Results  Component Value Date   HGBA1C 5.7 (H) 12/16/2013   No results found for: VITAMINB12 Lab Results  Component Value Date   TSH 6.759 (H) 01/11/2009    CLINICAL DATA: Chronic low back pain for 7 years  EXAM: MRI LUMBAR SPINE WITHOUT CONTRAST  TECHNIQUE: Multiplanar, multisequence MR imaging was performed. No intravenous contrast was  administered.  COMPARISON: 12/10/2006.  FINDINGS: There is a mild levoconvex curve of the lumbar spine with the apex at L3. The spinal cord terminates at the L1 level. The paraspinal soft tissues are within normal limits. Marrow signal is normal.  T11-T12 and T12-L1 appear normal.  L1-L2: Negative.  L2-L3: Mild disc desiccation with shallow broad-based central protrusion but no resulting stenosis.  L3-L4: Disc desiccation and mild loss of height. Right foraminal protrusion is present with associated annular tear. This produces very mild right foraminal stenosis with the protrusion contacting exiting right L3 nerve in the foramen. The left neural foramen and central canal are patent. Disc protrusion just contacts the descending right L4 nerve as it approaches the lateral recess.  L4-L5: Disc desiccation. Central annular tear with right paracentral protrusion. Very mild central stenosis. Mild encroachment on the right lateral recess and descending right L5 nerve. The neural foramina appear adequately patent.  L5-S1: Degenerated disc with loss of height. Vacuum disc. Central canal patent. Foramina appear adequately patent. Right paracentral broad-based protrusion is present mildly encroaching on the right S1 nerve in the lateral recess.  IMPRESSION: 1. L3-L4 right foraminal annular tear and protrusion with mild right foraminal stenosis potentially affecting the right L3 nerve. 2. L4-L5 annular tear and right paracentral protrusion with mild central stenosis and mild encroachment on the descending right L5 nerve. 3. L5-S1 disc degeneration with broad-based right paracentral protrusion minimally encroaching on the right lateral recess.   Electronically Signed  By: Dereck Ligas M.D.  On: 06/04/2013 08:03     ASSESSMENT AND PLAN  Meralgia paresthetica of right side  Obstructive sleep apnea  Allodynia  REM behavioral disorder  Excessive daytime  sleepiness   1.  Currently, her lateral femoral cutaneous neuropathy seems to be doing well on the combination  of oxcarbazepine, gabapentin and lidocaine ointment.  If this flares up a lot, consider referral back to [facility] for another ablation .  2.     The REM behavior disorder is doing well on low-dose clonazepam and this will be continued.   3.     Her excessive daytime sleepiness is improved with Provigil.  She still takes naps many days but has less unscheduled sleepiness.  4.    She will return to see our office in 6 months or sooner if she has new or worsening neurologic symptoms.   Tiondra Fang A. Felecia Shelling, MD, PhD 74/02/6382, 53:64 PM Certified in Neurology, Clinical Neurophysiology, Sleep Medicine, Pain Medicine and Neuroimaging  [facility] [address] [place], [place] [address] [phone]

## 2017-12-18 ENCOUNTER — Encounter (INDEPENDENT_AMBULATORY_CARE_PROVIDER_SITE_OTHER): Payer: Self-pay | Admitting: Physician Assistant

## 2017-12-18 ENCOUNTER — Ambulatory Visit (INDEPENDENT_AMBULATORY_CARE_PROVIDER_SITE_OTHER): Payer: Medicare Other | Admitting: Physician Assistant

## 2017-12-18 ENCOUNTER — Telehealth (INDEPENDENT_AMBULATORY_CARE_PROVIDER_SITE_OTHER): Payer: Self-pay

## 2017-12-18 DIAGNOSIS — M1712 Unilateral primary osteoarthritis, left knee: Secondary | ICD-10-CM

## 2017-12-18 DIAGNOSIS — M1711 Unilateral primary osteoarthritis, right knee: Secondary | ICD-10-CM | POA: Diagnosis not present

## 2017-12-18 MED ORDER — LIDOCAINE HCL 1 % IJ SOLN
0.5000 mL | INTRAMUSCULAR | Status: AC | PRN
  Administered 2017-12-18: .5 mL

## 2017-12-18 MED ORDER — HYALURONAN 88 MG/4ML IX SOSY
88.0000 mg | PREFILLED_SYRINGE | INTRA_ARTICULAR | Status: AC | PRN
  Administered 2017-12-18: 88 mg via INTRA_ARTICULAR

## 2017-12-18 NOTE — Progress Notes (Signed)
   Procedure Note  Patient: Melissa Rowe             Date of Birth: 11/07/1965           MRN: [id number]             Visit Date: 12/18/2017 HPI: Melissa Rowe returns today for bilateral Monovisc injections.  She last had both knees injected with cortisone 09/25/2017.  She states that her knees have been bothering for the last couple of weeks.  She does note she is walking slowly and using a cane to ambulate.  She has trouble bending her knees.  States her knees are tender to touch.  Physical exam: Bilateral knees no abnormal warmth erythema.  She has global tenderness bilateral knees.  Good range of motion of both knees bending past 90 degrees.  Procedures: Visit Diagnoses: Unilateral primary osteoarthritis, left knee  Unilateral primary osteoarthritis, right knee  Large Joint Inj: bilateral knee on 12/18/2017 6:41 PM Indications: pain Details: 22 G 1.5 in needle, anterolateral approach  Arthrogram: No  Medications (Right): 0.5 mL lidocaine 1 %; 88 mg Hyaluronan 88 MG/4ML Medications (Left): 0.5 mL lidocaine 1 %; 88 mg Hyaluronan 88 MG/4ML Outcome: tolerated well, no immediate complications Procedure, treatment alternatives, risks and benefits explained, specific risks discussed. Consent was given by the patient. Immediately prior to procedure a time out was called to verify the correct patient, procedure, equipment, support staff and site/side marked as required. Patient was prepped and draped in the usual sterile fashion.    Plan: She will follow-up with [place] on as-needed basis she understands that she can have cortisone injections in the middle of this month of November if need be.  And Monovisc injections no more often than every 6 months.

## 2017-12-18 NOTE — Telephone Encounter (Signed)
Per [facility] patient is approved for Monovisc, bilateral knee. [place] Patient will be responsible for 20% OOP of the allowable amount. $50.00 Co-pay PA required PA Approval# [id number] Valid 05/10/2017- 02/11/2018

## 2018-01-20 ENCOUNTER — Telehealth (INDEPENDENT_AMBULATORY_CARE_PROVIDER_SITE_OTHER): Payer: Self-pay

## 2018-01-20 NOTE — Telephone Encounter (Signed)
Receive a fax from [facility] stating that patient is approved for Monovisc Injection. Valid 02/12/2018- 02/12/2019 Reference# [id number]

## 2018-03-31 ENCOUNTER — Telehealth: Payer: Self-pay | Admitting: *Deleted

## 2018-03-31 NOTE — Telephone Encounter (Signed)
Waiting on determination from [employer].

## 2018-03-31 NOTE — Telephone Encounter (Signed)
Submitted PA provigil on covermymeds. Key: [id number]. Waiting on determination.

## 2018-04-01 NOTE — Telephone Encounter (Signed)
Received fax notification from [employer] that PA brand Provigil approved for non-formulary exception until 02/12/19 under Medicare part D benefit. Ref#: [id number].

## 2018-04-30 ENCOUNTER — Other Ambulatory Visit: Payer: Self-pay | Admitting: Neurology

## 2018-05-01 NOTE — Telephone Encounter (Signed)
Modafinil [place] Database Verified LR: 04-02-2018 Qty: 90  Clonazepam [place] Database Verified LR: 04-01-2018 Qty: 90 Pending appointment: 06-16-2018.

## 2018-06-12 ENCOUNTER — Telehealth: Payer: Self-pay | Admitting: *Deleted

## 2018-06-12 NOTE — Telephone Encounter (Signed)
I called pt. Updated medication list/pharmacy/allergies on file. She agreed to transition appt to [email or web address].  I texted her at [phone]@sms .[phone]

## 2018-06-16 ENCOUNTER — Other Ambulatory Visit: Payer: Self-pay

## 2018-06-16 ENCOUNTER — Ambulatory Visit (INDEPENDENT_AMBULATORY_CARE_PROVIDER_SITE_OTHER): Payer: Medicare Other | Admitting: Neurology

## 2018-06-16 ENCOUNTER — Encounter: Payer: Self-pay | Admitting: Orthopaedic Surgery

## 2018-06-16 ENCOUNTER — Encounter: Payer: Self-pay | Admitting: Neurology

## 2018-06-16 ENCOUNTER — Ambulatory Visit (INDEPENDENT_AMBULATORY_CARE_PROVIDER_SITE_OTHER): Payer: Medicare Other | Admitting: Orthopaedic Surgery

## 2018-06-16 DIAGNOSIS — G5711 Meralgia paresthetica, right lower limb: Secondary | ICD-10-CM | POA: Diagnosis not present

## 2018-06-16 DIAGNOSIS — M1712 Unilateral primary osteoarthritis, left knee: Secondary | ICD-10-CM | POA: Diagnosis not present

## 2018-06-16 DIAGNOSIS — M1711 Unilateral primary osteoarthritis, right knee: Secondary | ICD-10-CM | POA: Diagnosis not present

## 2018-06-16 DIAGNOSIS — G4733 Obstructive sleep apnea (adult) (pediatric): Secondary | ICD-10-CM

## 2018-06-16 DIAGNOSIS — G4752 REM sleep behavior disorder: Secondary | ICD-10-CM | POA: Diagnosis not present

## 2018-06-16 DIAGNOSIS — R200 Anesthesia of skin: Secondary | ICD-10-CM

## 2018-06-16 DIAGNOSIS — G4719 Other hypersomnia: Secondary | ICD-10-CM

## 2018-06-16 MED ORDER — LIDOCAINE HCL 1 % IJ SOLN
0.5000 mL | INTRAMUSCULAR | Status: AC | PRN
  Administered 2018-06-16: .5 mL

## 2018-06-16 MED ORDER — METHYLPREDNISOLONE ACETATE 40 MG/ML IJ SUSP
40.0000 mg | INTRAMUSCULAR | Status: AC | PRN
  Administered 2018-06-16: 40 mg via INTRA_ARTICULAR

## 2018-06-16 MED ORDER — METHYLPREDNISOLONE ACETATE 40 MG/ML IJ SUSP
40.0000 mg | INTRAMUSCULAR | Status: AC | PRN
  Administered 2018-06-16: 14:00:00 40 mg via INTRA_ARTICULAR

## 2018-06-16 MED ORDER — LIDOCAINE HCL 1 % IJ SOLN
0.5000 mL | INTRAMUSCULAR | Status: AC | PRN
  Administered 2018-06-16: 14:00:00 .5 mL

## 2018-06-16 NOTE — Progress Notes (Signed)
   Procedure Note  Patient: Melissa Rowe             Date of Birth: Jul 04, 1965           MRN: [id number]             Visit Date: 06/16/2018 HPI: Ms. Alber is well-known to Dr. Ninfa Linden service comes in today requesting cortisone injections both knees.  She last had Monovisc injections both knees in November with good relief.  However he does last month or so started bothering her more.  Left knee actually is giving out on her caused her to fall.  She is had no other injuries to either knee.  She is ambulating using a rolling [place]unno.  Bilateral knees: No abnormal warmth or erythema.  Tenderness along medial lateral joint line of both knees.  She is full extension the right knee flexion 90 degrees left knee full extension flexion to approximately 85 to 90 degrees.  Procedures: Visit Diagnoses: Unilateral primary osteoarthritis, left knee  Unilateral primary osteoarthritis, right knee  Large Joint Inj: bilateral knee on 06/16/2018 2:21 PM Indications: pain Details: 22 G 1.5 in needle, anterolateral approach  Arthrogram: No  Medications (Right): 0.5 mL lidocaine 1 %; 40 mg methylPREDNISolone acetate 40 MG/ML Medications (Left): 0.5 mL lidocaine 1 %; 40 mg methylPREDNISolone acetate 40 MG/ML Outcome: tolerated well, no immediate complications Procedure, treatment alternatives, risks and benefits explained, specific risks discussed. Consent was given by the patient. Immediately prior to procedure a time out was called to verify the correct patient, procedure, equipment, support staff and site/side marked as required. Patient was prepped and draped in the usual sterile fashion.    Plan: She will continue to work on weight loss.  Work on Forensic scientist.  She may benefit from Nanavati's injection in the near future.  She will call our office if she would like to proceed with we will see about having them approved.  Questions were encouraged and answered.  She understands to wait 3 months  between cortisone injections.

## 2018-06-16 NOTE — Progress Notes (Signed)
[facility]  PATIENT: Melissa Rowe DOB: 1965/05/26   REFERRING CLINICIAN: Dr. [name] HISTORY FROM: Patient  REASON FOR VISIT: Lateral thigh pain   HISTORICAL  CHIEF COMPLAINT:  No chief complaint on file.   HISTORY OF PRESENT ILLNESS:  Melissa Rowe is a 53 y.o. woman with right anterolateral thigh numbness and pain due to a Lateral femoral cutaneous neuropathy and a sleep disorder.     Update 06/16/18: Virtual Visit via Telephone Note I connected with [name] on 06/16/18 at 11:30 AM EDT by telephone and verified that I am speaking with the correct person.   I discussed the limitations, risks, security and privacy concerns of performing an evaluation and management service by telephone and the availability of in person appointments. I also discussed with the patient that there may be a patient responsible charge related to this service. The patient expressed understanding and agreed to proceed.  History of Present Illness: She is reporting more pain in the left arm with a tingling numbness like its a asleep and sometimes like a burning.   It goes into the entire hand.   Normally, if she gets hand numbness she would shake it and it would do better for a while.    Now the numbness stays.   This used to be intermittent x a couple years but progressed over the last year and is now near constant.    The right arm is fine.   The LFCN dysesthetic pain is mild with the medications and numbing ointment.       She is now sleeping well and is no longer having active dreams since starting clonazepam.   She still sometimes talks in her sleep.   She has OSA and uses CPAP.   Her EDS is better with modafinil.    She now weights 362 (was 369 at last visit).    Observations/Objective: She is alert and fully oriented with fluent speech and good attention, knowledge and memory.  I had her test sensation in the left arm.  She has reduced sensation over the left thumb  compared to the third and fifth fingers.  Assessment and Plan: Meralgia paresthetica of right side  REM behavioral disorder  Excessive daytime sleepiness  Obstructive sleep apnea  Arm numbness left  1.   Continue the oxcarbazepine and lidocaine ointment for the lateral femoral cutaneous neuropathy.  Patient has done much better recently. 2.   Continue clonazepam for the REM behavior disorder.  She is on CPAP (followed by pulmonology) and I have prescribed modafinil for excessive daytime sleepiness and she will continue. 3.   The arm numbness and pain is doing worse.  We will set up a nerve conduction/EMG study to determine if this is coming from a median neuropathy, C6 radiculopathy or another process. 4.   She will return to see me in 6 months or sooner if there are new or worsening neurologic symptoms  Follow Up Instructions: I discussed the assessment and treatment plan with the patient. The patient was provided an opportunity to ask questions and all were answered. The patient agreed with the plan and demonstrated an understanding of the instructions.   The patient was advised to call back or seek an in-person evaluation if the symptoms worsen or if the condition fails to improve as anticipated.  I provided 22 minutes of non-face-to-face time during this encounter.   [name], MD  ___________________________ From previous visits Update 12/16/2017:   Her LFCN  pain is doing fairly well.   The lidocaine ointment helped along with the gabapentin and oxcarbazepine.       She falls asleep easily on her medications.   She has OSA and is on CPAP.    Clonazepam helps her sleep and she has no hangover in the morning.  Her excessive daytime sleepiness is better with modafinil.     She sometimes takes a nap with CPAP (and sometimes if she dozes off while watching TV, without CPAP).     She always feels better after a nap.    Her active dreams are much better with clonazepam but she  still sometimes talks in her sleep.    She used to yell out but now just has talking.      Update 06/14/2017: LFCN:   The right leg pain improved with medication and improved further with an RFA or similar procedure by [facility].   The LFCN anterior region is numb and pain free abut the more lateral distribution has dysesthesia and pain.   She tolerates the medications  Sleep issues:    She is having active dreams, helped by clonazepam.    She talks in her dreams a lot and other times seems to be running in bed, arguing and punching.   She dozes off a lot and takes naps most days.   She has OSA on CPAP ([facility]).    She had a few episodes of sleep paralysis but none in last year.   No cataplexy.     EPWORTH SLEEPINESS SCALE  On a scale of 0 - 3 what is the chance of dozing:  Sitting and Reading:   3 Watching TV:    3 Sitting inactive in a public place: 2 Passenger in car for one hour: 1 Lying down to rest in the afternoon: 3 Sitting and talking to someone: 1 Sitting quietly after lunch:  3 In a car, stopped in traffic:   Does not drive   Total (out of 24):      16/24    Moderately severe sleepiness       From 08/14/2016: The lateral femoral cutaneous nerve pain improved on oxcarbazepine 600 mg by mouth twice a day with gabapentin 4 times a day. However, there was still a fair amount of residual pain due to the dysesthesia and allodynia.   She tolerates oxcarbazepine and gabapentin well at her current doses. The past she had more difficulty tolerating Lyrica (severe weight gain and strange dreams) and lamotrigine (felt dizzy).    She had a couple of injections at [facility] and has done better.    She is going to have another LFCN procedure at [facility].     Much of the distribution of the LFCN is now numb but there is still a painful patch (most posterolateral).    The lidocaine ointment helps this pain some.     She has had nightmare dreams the past 2 several  months.   She screams things like 'Run Run'.   Dreams are usually between 2-4 am.  She moves her legs some but has not punched or kicked or gotten out of bed.  This is now happening most nights.       She denies any muscle rigidity, gait changes.  She has had a couple episodes of sleep paralysis this year.   Maybe one episode of hypnagogic hallucination.    She had never had cataplexy   She has OSA and  is on CPAP.   She is often sleepy.      LFCN History:   She had breast reduction surgery on November 4th and she reports that the right thigh was 'going to sleep' with numbness and tingling later that night.    Over the next week it got more painful and by December, the pain was very severe.  Pain was apparently so bad that she was screaming out in the middle of the night. She describes a gnawing pain that is constant but also has a stabbing pain frequently.   She was worked in at her Primary care provider's office, Dr. [name], and was diagnosed with right meralgia paresthetica.   Lyrica was started.  She saw neurosurgery and the Lyrica dose was increased from 75  to 150 mg twice a day with only minimal relief. She returned to her primary care provider's office on 02/16/2014 and tramadol was added.    Of note, she gained about 40 pounds during the 4 weeks between her two primary care visits.   On the higher dose of Lyrica, the stabbing pain was helped but the more annoying constant pain was not. She was switched from Lyrica to gabapentin with benefit (02/2014).      Lamotrigine was not tolerated (added 03/2014).   Lidocaine ointment helps some (added 2/20165).    Oxcabazepine added 09/2014 with improvement, dose slwowly titrated to 600 mg po bid.  around May 2017 she had a procedure at [facility] with improvement of the anterior half of the painful region but not the lateral half.[name]        REVIEW OF SYSTEMS:  Constitutional: No fevers, chills, sweats, or change in appetite Eyes: No visual changes,  double vision, eye pain Ear, nose and throat: No hearing loss, ear pain, nasal congestion, sore throat Cardiovascular: No chest pain, palpitations Respiratory:  No shortness of breath at rest or with exertion.   No wheezes.    She has OSA and is on CPAP. GastrointestinaI: No nausea, vomiting, diarrhea, abdominal pain, fecal incontinence Genitourinary:  No dysuria, urinary retention or frequency.  No nocturia. Musculoskeletal:  No neck pain, some back pain, moderate knee pain   Integumentary: No rash, pruritus, skin lesions Neurological: as above  ALLERGIES: Allergies  Allergen Reactions  . Lamictal [Lamotrigine] Other (See Comments)    headache     HOME MEDICATIONS: Outpatient Medications Prior to Visit  Medication Sig Dispense Refill  . albuterol (PROVENTIL) (2.5 MG/3ML) 0.083% nebulizer solution USE 1 VIAL IN NEBULIZER EVERY 6 HOURS FOR SHORTNESS OF BREATH/WHEEZING AND AS NEEDED 30 vial 10  . calcium-vitamin D (OSCAL WITH D) 500-200 MG-UNIT per tablet Take 1 tablet by mouth daily.     . clonazePAM (KLONOPIN) 1 MG tablet TAKE 1 TABLET BY MOUTH AT  BEDTIME 90 tablet 1  . cyclobenzaprine (FLEXERIL) 10 MG tablet Take 1 tablet (10 mg total) by mouth 2 (two) times daily as needed for muscle spasms. 20 tablet 0  . gabapentin (NEURONTIN) 800 MG tablet TAKE 1 TABLET 4 TIMES  DAILY. PLEASE CALL [phone]  [phone] TO SCHEDULE FOLLOW UP. 360 tablet 3  . levothyroxine (SYNTHROID, LEVOTHROID) 175 MCG tablet     . lidocaine (XYLOCAINE) 5 % ointment Dispense 2 x 50 g tubes  Apply to right thigh twice a day 100 g 3  . modafinil (PROVIGIL) 200 MG tablet TAKE 1 TABLET BY MOUTH  DAILY 90 tablet 1  . mometasone-formoterol (DULERA) 200-5 MCG/ACT AERO Inhale 2 puffs into  the lungs 2 (two) times daily. 1 Inhaler 3  . montelukast (SINGULAIR) 10 MG tablet Take 10 mg by mouth daily with breakfast.    . Multiple Vitamin (MULTIVITAMIN) capsule Take 1 capsule by mouth daily.     Earney Navy Bicarbonate  (ZEGERID OTC PO) Take 1 tablet by mouth daily.    . Oxcarbazepine (TRILEPTAL) 300 MG tablet Take 2 tablets (600 mg total) by mouth 2 (two) times daily. 360 tablet 3  . polyethylene glycol (MIRALAX / GLYCOLAX) packet Take 17 g by mouth 2 (two) times daily.     . traMADol (ULTRAM) 50 MG tablet Take 50 mg by mouth 3 (three) times daily as needed for moderate pain.     No facility-administered medications prior to visit.     PAST MEDICAL HISTORY: Past Medical History:  Diagnosis Date  . Anemia sept /oct 2014  . Arthritis   . Asthma   . Atrial mass   . Constipation   . Diverticulitis   . Dyspnea   . Environmental allergies   . Frequency of urination   . Gastroenteritis   . GERD (gastroesophageal reflux disease)   . Glaucoma   . Hemorrhoids    external with some bleeding  . Hyperlipidemia   . Hypothyroidism   . Neuropathy   . Obesity   . OSA (obstructive sleep apnea)    wears CPAP night pt does not know settings  . PONV (postoperative nausea and vomiting)    states she gets sick on her stomach after surgery  . Urgency of urination     PAST SURGICAL HISTORY: Past Surgical History:  Procedure Laterality Date  . BREAST REDUCTION SURGERY Bilateral 12/16/2013   Procedure: BILATERAL BREAST REDUCTION;  Surgeon: Theodoro Kos, DO;  Location: [place];  Service: Plastics;  Laterality: Bilateral;  . BREAST REDUCTION SURGERY Bilateral   . CARPAL TUNNEL RELEASE     right  . CARPAL TUNNEL RELEASE Right   . CHOLECYSTECTOMY  2008  . CHOLECYSTECTOMY    . COLONOSCOPY    . COLONOSCOPY WITH PROPOFOL N/A 02/24/2013   Procedure: COLONOSCOPY WITH PROPOFOL;  Surgeon: Ladene Artist, MD;  Location: [facility];  Service: Endoscopy;  Laterality: N/A;  . KNEE ARTHROSCOPY  03/18/2012   Procedure: ARTHROSCOPY KNEE;  Surgeon: Mcarthur Rossetti, MD;  Location: [facility];  Service: Orthopedics;  Laterality: Right;  Right knee arthroscopy with debridement and three compartment chondroplasty  . polyps  removal  2007  . REDUCTION MAMMAPLASTY    . TUBAL LIGATION  1992  . tubal ligaton      FAMILY HISTORY: Family History  Problem Relation Age of Onset  . Heart murmur Mother        rheumatoid arthritis  . Heart attack Mother   . COPD Mother   . Diabetes Mother   . Breast cancer Mother   . Prostate cancer Father   . Kidney failure Father   . Asthma Brother        and mother both as a child    SOCIAL HISTORY:  Social History   Socioeconomic History  . Marital status: Married    Spouse name: Not on file  . Number of children: 2  . Years of education: Not on file  . Highest education level: Not on file  Occupational History  . 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 Financial resource strain: Not on file  . Food  insecurity:    Worry: Not on file    Inability: Not on file  . Transportation needs:    Medical: Not on file    Non-medical: Not on file  Tobacco Use  . Smoking status: Former Smoker    Packs/day: 2.00    Years: 7.00    Pack years: 14.00    Types: Cigarettes    Last attempt to quit: 02/12/1990    Years since quitting: 28.3  . Smokeless tobacco: Never Used  Substance and Sexual Activity  . Alcohol use: Yes    Comment: rare  . Drug use: No  . Sexual activity: Not on file  Lifestyle  . Physical activity:    Days per week: Not on file    Minutes per session: Not on file  . Stress: Not on file  Relationships  . Social connections:    Talks on phone: Not on file    Gets together: Not on file    Attends religious service: Not on file    Active member of club or organization: Not on file    Attends meetings of clubs or organizations: Not on file    Relationship status: Not on file  . Intimate partner violence:    Fear of current or ex partner: Not on file    Emotionally abused: Not on file    Physically abused: Not on file    Forced sexual activity: Not on file  Other Topics  Concern  . Not on file  Social History Narrative  . Not on file     PHYSICAL EXAM  There were no vitals filed for this visit.  There is no height or weight on file to calculate BMI.   General: The patient Is an obese woman in no acute distress.   Neurologic Exam  Mental status: The patient is alert and oriented x 3 at the time of the examination. The patient has apparent normal recent and remote memory, with an apparently normal attention span and concentration ability.   Speech is normal.  Cranial nerves: Extraocular movements are full.  Facial strength is normal.  Trapezius and sternocleidomastoid strength is normal. No dysarthria is noted.     Motor:  Muscle bulk and tone are normal. Strength is  5 / 5 in all 4 extremities.   Sensory:    She has reduced sensation to touch in the anterolateral right thigh.  Anteriorly, she is numb without allodynia or hyperpathia.  More laterally in the LFCN distribution she has reduced sensation while at the same time has allodynia and hyperpathia she has normal sensation to touch elsewhere..  Gait and station: Station is normal but the gait is arthritic with a reduced stride.  (She has significant knee arthritis)   Reflexes: Deep tendon reflexes are symmetric and normal bilaterally.     DIAGNOSTIC DATA (LABS, IMAGING, TESTING) - I reviewed patient records, labs, notes, testing and imaging myself where available.  Lab Results  Component Value Date   WBC 12.6 (H) 04/29/2015   HGB 13.1 04/29/2015   HCT 39.4 04/29/2015   MCV 62.9 (L) 04/29/2015   PLT 328 04/29/2015      Component Value Date/Time   NA 140 04/29/2015 1338   K 4.9 04/29/2015 1338   CL 104 04/29/2015 1338   CO2 22 04/29/2015 1338   GLUCOSE 94 04/29/2015 1338   BUN 17 04/29/2015 1338   CREATININE 0.83 04/29/2015 1338   CALCIUM 9.4 04/29/2015 1338   PROT 8.0 12/16/2013 0910  ALBUMIN 3.7 12/16/2013 0910   AST 13 12/16/2013 0910   ALT 13 12/16/2013 0910   ALKPHOS 75  12/16/2013 0910   BILITOT 0.2 (L) 12/16/2013 0910   GFRNONAA >60 04/29/2015 1338   GFRAA >60 04/29/2015 1338   Lab Results  Component Value Date   CHOL 209 (H) 09/22/2009   HDL 62 09/22/2009   LDLCALC 116 (H) 09/22/2009   TRIG 156 (H) 09/22/2009   CHOLHDL 3.4 Ratio 09/22/2009   Lab Results  Component Value Date   HGBA1C 5.7 (H) 12/16/2013   No results found for: VITAMINB12 Lab Results  Component Value Date   TSH 6.759 (H) 01/11/2009    CLINICAL DATA: Chronic low back pain for 7 years  EXAM: MRI LUMBAR SPINE WITHOUT CONTRAST  TECHNIQUE: Multiplanar, multisequence MR imaging was performed. No intravenous contrast was administered.  COMPARISON: 12/10/2006.  FINDINGS: There is a mild levoconvex curve of the lumbar spine with the apex at L3. The spinal cord terminates at the L1 level. The paraspinal soft tissues are within normal limits. Marrow signal is normal.  T11-T12 and T12-L1 appear normal.  L1-L2: Negative.  L2-L3: Mild disc desiccation with shallow broad-based central protrusion but no resulting stenosis.  L3-L4: Disc desiccation and mild loss of height. Right foraminal protrusion is present with associated annular tear. This produces very mild right foraminal stenosis with the protrusion contacting exiting right L3 nerve in the foramen. The left neural foramen and central canal are patent. Disc protrusion just contacts the descending right L4 nerve as it approaches the lateral recess.  L4-L5: Disc desiccation. Central annular tear with right paracentral protrusion. Very mild central stenosis. Mild encroachment on the right lateral recess and descending right L5 nerve. The neural foramina appear adequately patent.  L5-S1: Degenerated disc with loss of height. Vacuum disc. Central canal patent. Foramina appear adequately patent. Right paracentral broad-based protrusion is present mildly encroaching on the right S1 nerve in the lateral  recess.  IMPRESSION: 1. L3-L4 right foraminal annular tear and protrusion with mild right foraminal stenosis potentially affecting the right L3 nerve. 2. L4-L5 annular tear and right paracentral protrusion with mild central stenosis and mild encroachment on the descending right L5 nerve. 3. L5-S1 disc degeneration with broad-based right paracentral protrusion minimally encroaching on the right lateral recess.   Electronically Signed  By: Dereck Ligas M.D.  On: 06/04/2013 08:03     ASSESSMENT AND PLAN  Meralgia paresthetica of right side  REM behavioral disorder  Excessive daytime sleepiness  Obstructive sleep apnea  Arm numbness left    Raekwan Spelman A. Felecia Shelling, MD, PhD 03/23/9240, 68:34 PM Certified in Neurology, Clinical Neurophysiology, Sleep Medicine, Pain Medicine and Neuroimaging  [facility] [address], [place], [place] [address] [phone]

## 2018-06-19 ENCOUNTER — Encounter: Payer: Self-pay | Admitting: Internal Medicine

## 2018-06-19 ENCOUNTER — Other Ambulatory Visit: Payer: Self-pay

## 2018-06-19 ENCOUNTER — Ambulatory Visit (INDEPENDENT_AMBULATORY_CARE_PROVIDER_SITE_OTHER): Payer: Medicare Other | Admitting: Internal Medicine

## 2018-06-19 VITALS — BP 116/60 | HR 99 | Ht 66.0 in | Wt 371.2 lb

## 2018-06-19 DIAGNOSIS — G4733 Obstructive sleep apnea (adult) (pediatric): Secondary | ICD-10-CM | POA: Diagnosis not present

## 2018-06-19 DIAGNOSIS — J4540 Moderate persistent asthma, uncomplicated: Secondary | ICD-10-CM | POA: Diagnosis not present

## 2018-06-19 NOTE — Patient Instructions (Addendum)
Order- schedule unattended home sleep test  Dx OSA  Order- DME [facility]- please replace old CPAP machine, change to auto 5-20, mask of choice, humidifier, supplies, AirView  Please call us as needed

## 2018-06-19 NOTE — Progress Notes (Signed)
HPI Dyspnea due to Obesity and Asthma -> 05/05/2008: Spirometry suggests restriction. CXR 04/19/2008  Normal. CT 02/10/2008  - Neg for PE. No infiltrates -> Methacholine Challenge Test: 06/25/2008: Positive PC20 between 1-4  - 10/11/2009: fev1 2L/70% -> start pulmicort  -11/08/2009: fev1 2.24L/78% and better - > switch to QVAR due to cost - May 2012: fev1 2.32L/85% and normal but symptomactic -> change QVAR to dulera sample,  6day pred burst - Jul 13, 2010: Fev1 2.5L/94%, FVC 3.21/98% and improved -> continue dulera -10/26/2010 FEV1 2.33 L/87%, FVC 2.87 L/88% >cont on dulera  - med calendar 10/26/10   --------------------------------------------------------------------  06/19/2018- 53 yo F for sleep evaluation. NPSG 08/19/08- AHI 2.9/hr, REM, desaturation to 92%, body weight 328 lbs. She was tried on CPAP but got no relief for c/o EDS. Had been followed then for asthma. Now moved back into this area.  REM Behavior Disorder (Neurology) Dyspnea due to Obesity and Asthma Medical problem list includes  Anemia, Arthritis, Asthma, Degenerative Disk Disease, EDS, GERD, Glaucoma, Hypothyroid, Morbid Obesity,  -----LOV 05/11/2015, sleep study 2010, OSA on CPAP, hasn't gotten new machine in ~10 years, machine is not working properly Body weight today 371 lbs Dulera 200, Singulair, Provigil 200, gabapentin 800 4 x/ d, Clonazepam 1 mg HS,  Neb albuterol Has been fully compliant, trying to keep old machine running. Sleeps better and it prevents loud snoring and witnessed apneas which had been noted before CPAP.  Has f/u appt with Dr Chase Caller in July for asthma.  Denies ENT surgery or heart problems.   Prior to Admission medications   Medication Sig Start Date End Date Taking? Authorizing Provider  albuterol (PROVENTIL) (2.5 MG/3ML) 0.083% nebulizer solution USE 1 VIAL IN NEBULIZER EVERY 6 HOURS FOR SHORTNESS OF BREATH/WHEEZING AND AS NEEDED 08/29/15  Yes Brand Males, MD  calcium-vitamin D (OSCAL WITH D)  500-200 MG-UNIT per tablet Take 1 tablet by mouth daily.    Yes [provider]  clonazePAM (KLONOPIN) 1 MG tablet TAKE 1 TABLET BY MOUTH AT  BEDTIME 05/01/18  Yes Sater, Nanine Means, MD  cyclobenzaprine (FLEXERIL) 10 MG tablet Take 1 tablet (10 mg total) by mouth 2 (two) times daily as needed for muscle spasms. 09/22/13  Yes Tysinger, Benjamine Mola, NP  gabapentin (NEURONTIN) 800 MG tablet TAKE 1 TABLET 4 TIMES  DAILY. PLEASE CALL [phone]  [phone] TO SCHEDULE FOLLOW UP. 12/16/17  Yes Sater, Nanine Means, MD  levothyroxine (SYNTHROID, LEVOTHROID) 175 MCG tablet  09/19/17  Yes [provider]  lidocaine (XYLOCAINE) 5 % ointment Dispense 2 x 50 g tubes  Apply to right thigh twice a day 12/16/17  Yes Sater, Nanine Means, MD  modafinil (PROVIGIL) 200 MG tablet TAKE 1 TABLET BY MOUTH  DAILY 05/01/18  Yes Sater, Nanine Means, MD  mometasone-formoterol (DULERA) 200-5 MCG/ACT AERO Inhale 2 puffs into the lungs 2 (two) times daily. 12/30/14  Yes Brand Males, MD  montelukast (SINGULAIR) 10 MG tablet Take 10 mg by mouth daily with breakfast. 05/20/12  Yes Brand Males, MD  Multiple Vitamin (MULTIVITAMIN) capsule Take 1 capsule by mouth daily.    Yes [provider]  Omeprazole-Sodium Bicarbonate (ZEGERID OTC PO) Take 1 tablet by mouth daily.   Yes [provider]  Oxcarbazepine (TRILEPTAL) 300 MG tablet Take 2 tablets (600 mg total) by mouth 2 (two) times daily. 12/16/17  Yes Sater, Nanine Means, MD  polyethylene glycol (MIRALAX / GLYCOLAX) packet Take 17 g by mouth 2 (two) times daily.    Yes [provider]  traMADol (ULTRAM) 50 MG tablet Take 50 mg by mouth 3 (three) times daily as needed for moderate pain.   Yes [provider]   Past Medical History:  Diagnosis Date  . Anemia sept /oct 2014  . Arthritis   . Asthma   . Atrial mass   . Constipation   . Diverticulitis   . Dyspnea   . Environmental allergies   . Frequency of urination   . Gastroenteritis   . GERD  (gastroesophageal reflux disease)   . Glaucoma   . Hemorrhoids    external with some bleeding  . Hyperlipidemia   . Hypothyroidism   . Neuropathy   . Obesity   . OSA (obstructive sleep apnea)    wears CPAP night pt does not know settings  . PONV (postoperative nausea and vomiting)    states she gets sick on her stomach after surgery  . Urgency of urination    .ppsh Family History  Problem Relation Age of Onset  . Heart murmur Mother        rheumatoid arthritis  . Heart attack Mother   . COPD Mother   . Diabetes Mother   . Breast cancer Mother   . Prostate cancer Father   . Kidney failure Father   . Asthma Brother        and mother both as a child   Social History   Socioeconomic History  . Marital status: Married    Spouse name: Not on file  . Number of children: 2  . Years of education: Not on file  . Highest education level: Not on file  Occupational History  . 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 Financial resource strain: Not on file  . Food insecurity:    Worry: Not on file    Inability: Not on file  . Transportation needs:    Medical: Not on file    Non-medical: Not on file  Tobacco Use  . Smoking status: Former Smoker    Packs/day: 2.00    Years: 7.00    Pack years: 14.00    Types: Cigarettes    Last attempt to quit: 02/12/1990    Years since quitting: 28.4  . Smokeless tobacco: Never Used  Substance and Sexual Activity  . Alcohol use: Yes    Comment: rare  . Drug use: No  . Sexual activity: Not on file  Lifestyle  . Physical activity:    Days per week: Not on file    Minutes per session: Not on file  . Stress: Not on file  Relationships  . Social connections:    Talks on phone: Not on file    Gets together: Not on file    Attends religious service: Not on file    Active member of club or organization: Not on file    Attends meetings of clubs or organizations: Not  on file    Relationship status: Not on file  . Intimate partner violence:    Fear of current or ex partner: Not on file    Emotionally abused: Not on file    Physically abused: Not on file    Forced sexual activity: Not on file  Other Topics Concern  . Not on file  Social History Narrative  . Not on file   ROS-see HPI  + = positive Constitutional:    weight loss, night sweats, fevers,  chills,+ fatigue, lassitude. HEENT:    headaches, difficulty swallowing, tooth/dental problems, sore throat,       sneezing, itching, ear ache, nasal congestion, post nasal drip, snoring CV:    chest pain, orthopnea, PND, swelling in lower extremities, anasarca,                                  dizziness, palpitations Resp:  + shortness of breath with exertion or at rest.                productive cough,   non-productive cough, coughing up of blood.              change in color of mucus.  wheezing.   Skin:    rash or lesions. GI:  No-   heartburn, indigestion, abdominal pain, nausea, vomiting, diarrhea,                 change in bowel habits, loss of appetite GU: dysuria, change in color of urine, no urgency or frequency.   flank pain. MS:   joint pain, stiffness, decreased range of motion, back pain. Neuro-     nothing unusual Psych:  change in mood or affect.  depression or anxiety.   memory loss.  OBJ- Physical Exam General- Alert, Oriented, Affect-appropriate, Distress- none acute, + morbidly obese Skin- rash-none, lesions- none, excoriation- none Lymphadenopathy- none Head- atraumatic            Eyes- Gross vision intact, PERRLA, conjunctivae and secretions clear            Ears- Hearing, canals-normal            Nose- Clear, no-Septal dev, mucus, polyps, erosion, perforation             Throat- Mallampati II-III, mucosa clear , drainage- none, tonsils- atrophic, + teeth Neck- flexible , trachea midline, no stridor , thyroid nl, carotid no bruit Chest - symmetrical excursion , unlabored            Heart/CV- RRR , no murmur , no gallop  , no rub, nl s1 s2                           - JVD- none , edema- none, stasis changes- none, varices- none           Lung- clear to P&A, wheeze- none, cough- none , dullness-none, rub- none           Chest wall-  Abd-  Br/ Gen/ Rectal- Not done, not indicated Extrem- cyanosis- none, clubbing, none, atrophy- none, strength- nl Neuro- grossly intact to observation

## 2018-06-20 ENCOUNTER — Telehealth: Payer: Self-pay | Admitting: Internal Medicine

## 2018-06-20 NOTE — Telephone Encounter (Addendum)
I called Melissa with [facility] and she did not answer. LM for her to call me back.   CY I think they are needing an order for a home sleep test. Ok to order?  Spoke to [facility] she states patient didn't qualify for CPAP on her last test and needs another HST. She thinks the patient may have gotten a CPAP machine from their program but she will not be able to use that sleep study. The test is ordered. PCC's when can she be scheduled for the test. Does CY have to sign order? Please advise.   Patient Instructions by Deneise Lever, MD at 06/19/2018 4:30 PM  Author: Deneise Lever, MD Author Type: Physician Filed: 06/19/2018 5:12 PM  Note Status: Addendum Mickle Mallory: Cosign Not Required Encounter Date: 06/19/2018  Editor: Deneise Lever, MD (Physician)  Prior Versions: 1. Deneise Lever, MD (Physician) at 06/19/2018 5:11 PM - Signed    Order- schedule unattended home sleep test  Dx OSA  Order- [facility]- please replace old CPAP machine, change to auto 5-20, mask of choice, humidifier, supplies, AirView  Please call us as needed    Instructions      Return in about 3 months (around 09/19/2018).  Order- schedule unattended home sleep test  Dx OSA  Order- [facility]- please replace old CPAP machine, change to auto 5-20, mask of choice, humidifier, supplies, AirView  Please call us as needed       After Visit Summary (Printed 06/19/2018)

## 2018-06-20 NOTE — Telephone Encounter (Signed)
I have cancelled the current order for CPAP issues & will await the scheduling of the HST.  Will have CY place new order once HST is completed.

## 2018-06-25 ENCOUNTER — Telehealth: Payer: Self-pay | Admitting: Internal Medicine

## 2018-06-25 NOTE — Telephone Encounter (Signed)
I LM on pt's VM to schedule HST.

## 2018-06-26 NOTE — Telephone Encounter (Signed)
Pt is sched for 5/22 @ 12:00.  Nothing further needed at this time.

## 2018-07-02 NOTE — Assessment & Plan Note (Signed)
For f/u with Dr Chase Caller. Under control at this visit.

## 2018-07-02 NOTE — Assessment & Plan Note (Signed)
Machine is old and due for replacement. We will update date with new sleep study at current weight. Meanwhile replace old machine, changing to auto 5-20, replacing mask and supplies.  We can reqssess settings after sleep study is updated.

## 2018-07-02 NOTE — Assessment & Plan Note (Signed)
Important for her long-term welfare that she lose weight. This will likely take a major attitude and life-style change. Consider bariatric referral.

## 2018-07-04 ENCOUNTER — Ambulatory Visit: Payer: Medicare Other

## 2018-07-04 ENCOUNTER — Other Ambulatory Visit: Payer: Self-pay

## 2018-07-04 DIAGNOSIS — G4733 Obstructive sleep apnea (adult) (pediatric): Secondary | ICD-10-CM

## 2018-07-08 DIAGNOSIS — G4733 Obstructive sleep apnea (adult) (pediatric): Secondary | ICD-10-CM

## 2018-07-16 DIAGNOSIS — G4733 Obstructive sleep apnea (adult) (pediatric): Secondary | ICD-10-CM

## 2018-07-31 ENCOUNTER — Telehealth: Payer: Self-pay | Admitting: Internal Medicine

## 2018-07-31 DIAGNOSIS — G4733 Obstructive sleep apnea (adult) (pediatric): Secondary | ICD-10-CM

## 2018-07-31 NOTE — Telephone Encounter (Signed)
HST performed 07/07/2018. Pt calling requesting to know the results. Dr. Annamaria Boots, please advise on this for pt. Thank you!

## 2018-08-01 NOTE — Telephone Encounter (Signed)
Home sleep test confirms obstructive sleep apnea, averaging 9 apneas/ hour with drops in blood oxygen level.  Please order DME [facility]- ok to replace old CPAP machine, auto 5-20, mask of choice, humidifier, supplies, AirView/ card.

## 2018-08-01 NOTE — Telephone Encounter (Signed)
Spoke with pt and advised her message from Dr. Annamaria Boots. Pt understood and order placed to [facility] for CPAP. Nothing further is needed.

## 2018-08-25 ENCOUNTER — Telehealth: Payer: Self-pay | Admitting: Orthopaedic Surgery

## 2018-08-25 ENCOUNTER — Telehealth: Payer: Self-pay

## 2018-08-25 NOTE — Telephone Encounter (Signed)
Pt called in requesting to get an approval for Monovisc injection for bilateral knee's.  [phone]

## 2018-08-25 NOTE — Telephone Encounter (Signed)
Noted  

## 2018-08-25 NOTE — Telephone Encounter (Signed)
Can we get her approved please

## 2018-08-25 NOTE — Telephone Encounter (Signed)
Submitted VOB for SynviscOne, bilateral knee. 

## 2018-08-29 ENCOUNTER — Telehealth: Payer: Self-pay

## 2018-08-29 NOTE — Telephone Encounter (Signed)
Talked with patient and advised that she is approved for gel injection.  Approved for SynviscOne, bilateral knee. [facility] Patient will be responsible for 20% OOP. No Co-pay No PA required  Appt. 09/01/2018 with Dr. Ninfa Linden

## 2018-09-01 ENCOUNTER — Encounter: Payer: Self-pay | Admitting: Orthopaedic Surgery

## 2018-09-01 ENCOUNTER — Ambulatory Visit (INDEPENDENT_AMBULATORY_CARE_PROVIDER_SITE_OTHER): Payer: Medicare Other | Admitting: Orthopaedic Surgery

## 2018-09-01 VITALS — Ht 66.0 in | Wt 371.0 lb

## 2018-09-01 DIAGNOSIS — M1712 Unilateral primary osteoarthritis, left knee: Secondary | ICD-10-CM | POA: Diagnosis not present

## 2018-09-01 DIAGNOSIS — M1711 Unilateral primary osteoarthritis, right knee: Secondary | ICD-10-CM | POA: Diagnosis not present

## 2018-09-01 MED ORDER — HYLAN G-F 20 48 MG/6ML IX SOSY
48.0000 mg | PREFILLED_SYRINGE | INTRA_ARTICULAR | Status: AC | PRN
  Administered 2018-09-01: 48 mg via INTRA_ARTICULAR

## 2018-09-01 NOTE — Progress Notes (Signed)
   Procedure Note  Patient: Melissa Rowe             Date of Birth: 14-May-1965           MRN: [id number]             Visit Date: 09/01/2018  Procedures: Visit Diagnoses: No diagnosis found.  Large Joint Inj: R knee on 09/01/2018 11:39 AM Indications: pain and diagnostic evaluation Details: 22 G 1.5 in needle, superolateral approach  Arthrogram: No  Medications: 48 mg Hylan 48 MG/6ML Outcome: tolerated well, no immediate complications Procedure, treatment alternatives, risks and benefits explained, specific risks discussed. Consent was given by the patient. Immediately prior to procedure a time out was called to verify the correct patient, procedure, equipment, support staff and site/side marked as required. Patient was prepped and draped in the usual sterile fashion.   Large Joint Inj: L knee on 09/01/2018 11:39 AM Indications: pain and diagnostic evaluation Details: 22 G 1.5 in needle, superolateral approach  Arthrogram: No  Medications: 48 mg Hylan 48 MG/6ML Outcome: tolerated well, no immediate complications Procedure, treatment alternatives, risks and benefits explained, specific risks discussed. Consent was given by the patient. Immediately prior to procedure a time out was called to verify the correct patient, procedure, equipment, support staff and site/side marked as required. Patient was prepped and draped in the usual sterile fashion.    The patient is here for bilateral knee injections with Synvisc 1.  This is to treat the pain from osteoarthritis.  She is tried and failed other forms of conservative treatment including weight loss, anti-inflammatories, activity modification, quad strengthening exercises and steroid injections.  She is someone who weighs 371 pounds with a BMI of almost 60.  She is not a surgical candidate.  At this point she does wish to try the gel injections in her knees.  We have explained in detail the risk and benefits of these injections and how this  may or may not help her.  She is still working on getting into a weight loss clinic.  Both knees are morbidly obese.  Is hard to tell whether or not there is effusion or not.  I was able to place Synvisc 1 in both knees without difficulty.  This point follow-up is as needed.  All question concerns were answered addressed.

## 2018-09-22 ENCOUNTER — Ambulatory Visit: Payer: Medicare Other | Admitting: Adult Health

## 2018-09-23 ENCOUNTER — Encounter (INDEPENDENT_AMBULATORY_CARE_PROVIDER_SITE_OTHER): Payer: Medicare Other | Admitting: Neurology

## 2018-09-23 ENCOUNTER — Ambulatory Visit (INDEPENDENT_AMBULATORY_CARE_PROVIDER_SITE_OTHER): Payer: Medicare Other | Admitting: Neurology

## 2018-09-23 ENCOUNTER — Other Ambulatory Visit: Payer: Self-pay

## 2018-09-23 DIAGNOSIS — G5602 Carpal tunnel syndrome, left upper limb: Secondary | ICD-10-CM

## 2018-09-23 DIAGNOSIS — R200 Anesthesia of skin: Secondary | ICD-10-CM | POA: Diagnosis not present

## 2018-09-23 DIAGNOSIS — Z0289 Encounter for other administrative examinations: Secondary | ICD-10-CM

## 2018-09-23 NOTE — Progress Notes (Signed)
Full Name: Melissa Rowe Gender: Female MRN #: [id number] Date of Birth: [birth year]    Visit Date: 09/23/2018 09:04 Age: 53 Years 48 Months Old Examining Physician: Arlice Colt, MD  Referring Physician: Arlice Colt, MD    History:  Melissa Rowe is a 53 year old woman who has had left hand and wrist pain and numbness that has worsened this year.  On exam, strength is 4+/5 in the left APB muscle and 5/5 in the ulnar innervated hand muscles.  Nerve conduction studies: The left median motor response had a markedly delayed latency but normal amplitude and forearm conduction.  The left median sensory response was nonresponsive.  The left ulnar and radial motor responses were normal.  The right median and ulnar are motor responses were normal.   The left ulnar and radial sensory responses were normal.  The right ulnar and median sensory responses were normal.  Electromyography: Needle EMG of selected muscles of the left arm was performed.  There was minimal chronic denervation in the left APB muscle while other muscles in the arm were normal.  Impression: This NCV/EMG study shows the following: 1.   Moderately severe left median neuropathy at the wrist (carpal tunnel syndrome). 2.   No other mononeuropathies or left cervical radiculopathy was identified.    A. Felecia Shelling, MD, PhD, FAAN Certified in Neurology, Clinical Neurophysiology, Sleep Medicine, Pain Medicine and Neuroimaging Director, [facility] [address], [place], [place] [address] [phone]  Clinical note: I discussed the findings with Melissa Rowe.  I will have her see orthopedics about carpal tunnel release.  She is advised to wear a left wrist splint. -- RAS      MNC    Nerve / Sites Muscle Latency Ref. Amplitude Ref. Rel Amp Segments Distance Velocity Ref. Area    ms ms mV mV %  cm m/s m/s mVms  R Median - APB     Wrist APB 4.0 ?4.4 6.7 ?4.0 100 Wrist - APB 7   17.8     Upper arm APB 7.9  6.5  97.5 Upper arm - Wrist 21 53 ?49 17.4  L Median - APB     Wrist APB 9.9 ?4.4 5.3 ?4.0 100 Wrist - APB 7   16.1     Upper arm APB 14.2  5.2  96.9 Upper arm - Wrist 21 49 ?49 15.7  R Ulnar - ADM     Wrist ADM 2.3 ?3.3 10.7 ?6.0 100 Wrist - ADM 7   28.6     B.Elbow ADM 6.0  10.1  94.9 B.Elbow - Wrist 20 54 ?49 27.4     A.Elbow ADM 7.7  10.0  98.2 A.Elbow - B.Elbow 8 50 ?49 27.2         A.Elbow - Wrist      L Ulnar - ADM     Wrist ADM 2.9 ?3.3 10.2 ?6.0 100 Wrist - ADM 7   25.7     B.Elbow ADM 6.3  8.5  83.1 B.Elbow - Wrist 20 59 ?49 21.2     A.Elbow ADM 7.9  8.5  100 A.Elbow - B.Elbow 8 50 ?49 21.1         A.Elbow - Wrist      L Radial - EIP     Forearm EIP 1.8 ?2.9 5.0 ?2.0 100 Forearm - EIP 4  ?49 23.2     Elbow EIP 4.4  4.0  81 Elbow - Forearm 15 59  19.1     Spiral Gr EIP 7.1  3.6  90 Spiral Gr - Elbow 14 51  19.2                     SNC    Nerve / Sites Rec. Site Peak Lat Ref.  Amp Ref. Segments Distance    ms ms V V  cm  L Radial - Anatomical snuff box (Forearm)     Forearm Wrist 2.6 ?2.9 19 ?15 Forearm - Wrist 10  R Median - Orthodromic (Dig II, Mid palm)     Dig II Wrist 3.2 ?3.4 18 ?10 Dig II - Wrist 13  L Median - Orthodromic (Dig II, Mid palm)     Dig II Wrist NR ?3.4 NR ?10 Dig II - Wrist 13  R Ulnar - Orthodromic, (Dig V, Mid palm)     Dig V Wrist 2.8 ?3.1 6 ?5 Dig V - Wrist 11  L Ulnar - Orthodromic, (Dig V, Mid palm)     Dig V Wrist 2.7 ?3.1 5 ?5 Dig V - Wrist 39               F  Wave    Nerve F Lat Ref.   ms ms  R Ulnar - ADM 28.8 ?32.0  L Ulnar - ADM 28.9 ?32.0         EMG full       EMG Summary Table    Spontaneous MUAP Recruitment  Muscle IA Fib PSW Fasc Other Amp Dur. Poly Pattern  L. Abductor pollicis brevis Normal None None None _______ Normal Increased 1+ Reduced  L. First dorsal interosseous Normal None None None _______ Normal Normal Normal Normal  L.  Deltoid Normal None None None _______ Normal Normal Normal Normal  L. Triceps brachii Normal None None None _______ Normal Normal Normal Normal  L. Biceps brachii Normal None None None _______ Normal Normal Normal Normal  L. Extensor digitorum communis Normal None None None _______ Normal Normal Normal Normal

## 2018-09-29 ENCOUNTER — Ambulatory Visit: Payer: Medicare Other | Admitting: Adult Health

## 2018-10-01 ENCOUNTER — Encounter: Payer: Self-pay | Admitting: Orthopaedic Surgery

## 2018-10-01 ENCOUNTER — Ambulatory Visit (INDEPENDENT_AMBULATORY_CARE_PROVIDER_SITE_OTHER): Payer: Medicare Other | Admitting: Orthopaedic Surgery

## 2018-10-01 ENCOUNTER — Ambulatory Visit: Payer: Medicare Other | Admitting: Orthopaedic Surgery

## 2018-10-01 ENCOUNTER — Other Ambulatory Visit: Payer: Self-pay

## 2018-10-01 DIAGNOSIS — G5602 Carpal tunnel syndrome, left upper limb: Secondary | ICD-10-CM | POA: Diagnosis not present

## 2018-10-01 NOTE — Progress Notes (Signed)
Office Visit Note   Patient: Melissa Rowe           Date of Birth: 18-Apr-1965           MRN: [id number] Visit Date: 10/01/2018              Requested by: Marton Redwood, MD [address] [place],  [place] [address] PCP: Marton Redwood, MD   Assessment & Plan: Visit Diagnoses:  1. Left carpal tunnel syndrome     Plan: I did explain in detail what carpal tunnel syndrome means and our recommendation for a left open carpal tunnel release.  I explained in detail the risk and benefits of the surgery.  We talked about nonoperative and operative treatment measures.  We talked about the risk and benefits of surgery.  Still concerned about her morbid obesity, but with surgery like this this can be done under just some light sedation and local anesthesia.  She agrees to have surgery scheduled.  We will work on getting this set up and then we will see her back in 2 weeks postoperative.  All question concerns were answered and addressed.  Follow-Up Instructions: Return for 2 weeks post-op.   Orders:  No orders of the defined types were placed in this encounter.  No orders of the defined types were placed in this encounter.     Procedures: No procedures performed   Clinical Data: No additional findings.   Subjective: Chief Complaint  Patient presents with  . Left Hand - Pain  The patient is well-known to me.  She comes in today for evaluation treatment of left carpal tunnel syndrome.  This is been confirmed by a neurologist who did perform EMG-nerve conduction velocity studies.  This did show moderate to quite severe carpal tunnel syndrome involving the left upper extremity.  She is in a wrist splint at night.  She does report numbness in her hand mainly in the median nerve distribution.  It is radiating up into her arm.  She is someone who is morbidly obese with a weight of 371 pounds.  We have seen her for other issues in the past.  HPI  Review of Systems She currently denies any  headache, chest pain, shortness of breath, fever, chills, nausea, vomiting  Objective: Vital Signs: There were no vitals taken for this visit.  Physical Exam She is alert and orient x3 and in no acute distress.  She is morbidly obese. Ortho Exam Examination of her left hand does show weak grip and pinch strength.  She has a positive Phalen's and Tinel sign the left side.  She has numbness in mainly the median nerve distribution.  There is no muscle atrophy. Specialty Comments:  No specialty comments available.  Imaging: No results found.   PMFS History: Patient Active Problem List   Diagnosis Date Noted  . Left carpal tunnel syndrome 09/23/2018  . Arm numbness left 06/16/2018  . Excessive daytime sleepiness 12/16/2017  . Unilateral primary osteoarthritis, left knee 08/14/2016  . Unilateral primary osteoarthritis, right knee 08/14/2016  . Chronic pain of both knees 08/14/2016  . REM behavioral disorder 08/14/2016  . Primary osteoarthritis of both knees 06/14/2016  . Allodynia 03/26/2014  . Meralgia paraesthetica 02/26/2014  . Degeneration of lumbar or lumbosacral intervertebral disc 02/26/2014  . Symptomatic mammary hypertrophy 12/16/2013  . Acute bronchitis 04/02/2013  . Flu-like symptoms 04/02/2013  . Blood in stool 02/24/2013  . Iron deficiency anemia, unspecified 02/24/2013  . Asthma with acute exacerbation 12/28/2012  .  Post-nasal drainage 09/02/2012  . Arthritis of knee, right 03/18/2012  . Preoperative respiratory examination 03/14/2012  . Preop respiratory exam 02/28/2012  . Acute URI 02/12/2012  . Vitamin D deficiency 09/21/2010  . MORBID OBESITY 10/14/2008  . Obstructive sleep apnea 10/14/2008  . Moderate persistent asthma 06/02/2008   Past Medical History:  Diagnosis Date  . Anemia sept /oct 2014  . Arthritis   . Asthma   . Atrial mass   . Constipation   . Diverticulitis   . Dyspnea   . Environmental allergies   . Frequency of urination   .  Gastroenteritis   . GERD (gastroesophageal reflux disease)   . Glaucoma   . Hemorrhoids    external with some bleeding  . Hyperlipidemia   . Hypothyroidism   . Neuropathy   . Obesity   . OSA (obstructive sleep apnea)    wears CPAP night pt does not know settings  . PONV (postoperative nausea and vomiting)    states she gets sick on her stomach after surgery  . Urgency of urination     Family History  Problem Relation Age of Onset  . Heart murmur Mother        rheumatoid arthritis  . Heart attack Mother   . COPD Mother   . Diabetes Mother   . Breast cancer Mother   . Prostate cancer Father   . Kidney failure Father   . Asthma Brother        and mother both as a child    Past Surgical History:  Procedure Laterality Date  . BREAST REDUCTION SURGERY Bilateral 12/16/2013   Procedure: BILATERAL BREAST REDUCTION;  Surgeon: Theodoro Kos, DO;  Location: [facility];  Service: Plastics;  Laterality: Bilateral;  . BREAST REDUCTION SURGERY Bilateral   . CARPAL TUNNEL RELEASE     right  . CARPAL TUNNEL RELEASE Right   . CHOLECYSTECTOMY  2008  . CHOLECYSTECTOMY    . COLONOSCOPY    . COLONOSCOPY WITH PROPOFOL N/A 02/24/2013   Procedure: COLONOSCOPY WITH PROPOFOL;  Surgeon: Ladene Artist, MD;  Location: [facility];  Service: Endoscopy;  Laterality: N/A;  . KNEE ARTHROSCOPY  03/18/2012   Procedure: ARTHROSCOPY KNEE;  Surgeon: Mcarthur Rossetti, MD;  Location: [facility];  Service: Orthopedics;  Laterality: Right;  Right knee arthroscopy with debridement and three compartment chondroplasty  . polyps removal  2007  . REDUCTION MAMMAPLASTY    . TUBAL LIGATION  1992  . tubal ligaton     Social History   Occupational History  . 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 Smoking status: Former Smoker    Packs/day: 2.00    Years: 7.00    Pack years: 14.00    Types: Cigarettes    Quit date: 02/12/1990    Years since  quitting: 28.6  . Smokeless tobacco: Never Used  Substance and Sexual Activity  . Alcohol use: Yes    Comment: rare  . Drug use: No  . Sexual activity: Not on file

## 2018-10-06 ENCOUNTER — Other Ambulatory Visit: Payer: Self-pay | Admitting: Physician Assistant

## 2018-10-07 ENCOUNTER — Encounter (HOSPITAL_COMMUNITY): Payer: Self-pay

## 2018-10-07 ENCOUNTER — Other Ambulatory Visit (HOSPITAL_COMMUNITY)
Admission: RE | Admit: 2018-10-07 | Discharge: 2018-10-07 | Disposition: A | Discharge status: Home / Self Care | Payer: Medicare Other | Source: Ambulatory Visit | Attending: Orthopaedic Surgery | Admitting: Orthopaedic Surgery

## 2018-10-07 DIAGNOSIS — Z01812 Encounter for preprocedural laboratory examination: Secondary | ICD-10-CM | POA: Insufficient documentation

## 2018-10-07 DIAGNOSIS — Z20828 Contact with and (suspected) exposure to other viral communicable diseases: Secondary | ICD-10-CM | POA: Insufficient documentation

## 2018-10-07 LAB — SARS CORONAVIRUS 2 (TAT 6-24 HRS): SARS Coronavirus 2: NEGATIVE

## 2018-10-07 NOTE — Patient Instructions (Addendum)
YOU NEED TO HAVE A COVID 19 TEST ON_______ @_______ , THIS TEST MUST BE DONE BEFORE SURGERY, COME  [place], [place] , [id number]. ONCE YOUR COVID TEST IS COMPLETED, PLEASE BEGIN THE QUARANTINE INSTRUCTIONS AS OUTLINED IN YOUR HANDOUT.                Melissa Rowe  10/07/2018   Your procedure is scheduled on: 10-10-18  Report to [facility] Main  Entrance              Report to admitting at        1130  AM   [address] [facility]. NO VISITORS ARE ALLOWED IN SHORT STAY OR RECOVERY ROOM.   Call this number if you have problems the morning of surgery [phone]    Remember: NO SOLID FOOD AFTER MIDNIGHT THE NIGHT PRIOR TO SURGERY. NOTHING BY MOUTH EXCEPT CLEAR LIQUIDS UNTIL     1030 am  . PLEASE FINISH ENSURE DRINK PER SURGEON ORDER  WHICH NEEDS TO BE COMPLETED AT     1030 am then nothing by mouth .    CLEAR LIQUID DIET   Foods Allowed                                                                     Foods Excluded  Coffee and tea, regular and decaf                             liquids that you cannot  Plain Jell-O any favor except red or purple                                           see through such as: Fruit ices (not with fruit pulp)                                     milk, soups, orange juice  Iced Popsicles                                    All solid food Carbonated beverages, regular and diet                                    Cranberry, grape and apple juices Sports drinks like Gatorade Lightly seasoned clear broth or consume(fat free) Sugar, honey syrup   _____________________________________________________________________    BRUSH YOUR TEETH MORNING OF SURGERY AND RINSE YOUR MOUTH OUT, NO CHEWING GUM CANDY OR MINTS.     Take these medicines the morning of surgery with A SIP OF WATER: oxcarbazepine, zegrid, modafanil, levothyroxine, gabapentin, rescue inhaler bring with you                     You may not have any metal on your body  including hair pins and              piercings  Do not wear jewelry, make-up, lotions, powders or perfumes, deodorant             Do not wear nail polish.  Do not shave  48 hours prior to surgery.     Do not bring valuables to the hospital. [place].  Contacts, dentures or bridgework may not be worn into surgery.      Patients discharged the day of surgery will not be allowed to drive home. IF YOU ARE HAVING SURGERY AND GOING HOME THE SAME DAY, YOU MUST HAVE AN ADULT TO DRIVE YOU HOME AND BE WITH YOU FOR 24 HOURS. YOU MAY GO HOME BY TAXI OR UBER OR ORTHERWISE, BUT AN ADULT MUST ACCOMPANY YOU HOME AND STAY WITH YOU FOR 24 HOURS.  Name and phone number of your driver:  Special Instructions: N/A              Please read over the following fact sheets you were given: _____________________________________________________________________             [facility] - Preparing for Surgery Before surgery, you can play an important role.  Because skin is not sterile, your skin needs to be as free of germs as possible.  You can reduce the number of germs on your skin by washing with CHG (chlorahexidine gluconate) soap before surgery.  CHG is an antiseptic cleaner which kills germs and bonds with the skin to continue killing germs even after washing. Please DO NOT use if you have an allergy to CHG or antibacterial soaps.  If your skin becomes reddened/irritated stop using the CHG and inform your nurse when you arrive at Short Stay. Do not shave (including legs and underarms) for at least 48 hours prior to the first CHG shower.  You may shave your face/neck. Please follow these instructions carefully:  1.  Shower with CHG Soap the night before surgery and the  morning of Surgery.  2.  If you choose to wash your hair, wash your hair first as usual with your  normal  shampoo.  3.  After you shampoo,  rinse your hair and body thoroughly to remove the  shampoo.                           4.  Use CHG as you would any other liquid soap.  You can apply chg directly  to the skin and wash                       Gently with a scrungie or clean washcloth.  5.  Apply the CHG Soap to your body ONLY FROM THE NECK DOWN.   Do not use on face/ open                           Wound or open sores. Avoid contact with eyes, ears mouth and genitals (private parts).                       Wash face,  Genitals (private parts) with your normal soap.             6.  Wash thoroughly, paying special attention  to the area where your surgery  will be performed.  7.  Thoroughly rinse your body with warm water from the neck down.  8.  DO NOT shower/wash with your normal soap after using and rinsing off  the CHG Soap.                9.  Pat yourself dry with a clean towel.            10.  Wear clean pajamas.            11.  Place clean sheets on your bed the night of your first shower and do not  sleep with pets. Day of Surgery : Do not apply any lotions/deodorants the morning of surgery.  Please wear clean clothes to the hospital/surgery center.  FAILURE TO FOLLOW THESE INSTRUCTIONS MAY RESULT IN THE CANCELLATION OF YOUR SURGERY PATIENT SIGNATURE_________________________________  NURSE SIGNATURE__________________________________  ________________________________________________________________________   Melissa Rowe  An incentive spirometer is a tool that can help keep your lungs clear and active. This tool measures how well you are filling your lungs with each breath. Taking long deep breaths may help reverse or decrease the chance of developing breathing (pulmonary) problems (especially infection) following:  A long period of time when you are unable to move or be active. BEFORE THE PROCEDURE   If the spirometer includes an indicator to show your best effort, your nurse or respiratory therapist will set it  to a desired goal.  If possible, sit up straight or lean slightly forward. Try not to slouch.  Hold the incentive spirometer in an upright position. INSTRUCTIONS FOR USE  1. Sit on the edge of your bed if possible, or sit up as far as you can in bed or on a chair. 2. Hold the incentive spirometer in an upright position. 3. Breathe out normally. 4. Place the mouthpiece in your mouth and seal your lips tightly around it. 5. Breathe in slowly and as deeply as possible, raising the piston or the ball toward the top of the column. 6. Hold your breath for 3-5 seconds or for as long as possible. Allow the piston or ball to fall to the bottom of the column. 7. Remove the mouthpiece from your mouth and breathe out normally. 8. Rest for a few seconds and repeat Steps 1 through 7 at least 10 times every 1-2 hours when you are awake. Take your time and take a few normal breaths between deep breaths. 9. The spirometer may include an indicator to show your best effort. Use the indicator as a goal to work toward during each repetition. 10. After each set of 10 deep breaths, practice coughing to be sure your lungs are clear. If you have an incision (the cut made at the time of surgery), support your incision when coughing by placing a pillow or rolled up towels firmly against it. Once you are able to get out of bed, walk around indoors and cough well. You may stop using the incentive spirometer when instructed by your caregiver.  RISKS AND COMPLICATIONS  Take your time so you do not get dizzy or light-headed.  If you are in pain, you may need to take or ask for pain medication before doing incentive spirometry. It is harder to take a deep breath if you are having pain. AFTER USE  Rest and breathe slowly and easily.  It can be helpful to keep track of a log of your progress. Your caregiver can provide you with  a simple table to help with this. If you are using the spirometer at home, follow these  instructions: Central IF:   You are having difficultly using the spirometer.  You have trouble using the spirometer as often as instructed.  Your pain medication is not giving enough relief while using the spirometer.  You develop fever of 100.5 F (38.1 C) or higher. SEEK IMMEDIATE MEDICAL CARE IF:   You cough up bloody sputum that had not been present before.  You develop fever of 102 F (38.9 C) or greater.  You develop worsening pain at or near the incision site. MAKE SURE YOU:   Understand these instructions.  Will watch your condition.  Will get help right away if you are not doing well or get worse. Document Released: 06/11/2006 Document Revised: 04/23/2011 Document Reviewed: 08/12/2006 [facility] Patient Information 2014 [place], [place].   ________________________________________________________________________

## 2018-10-07 NOTE — Progress Notes (Signed)
PCP - [place] Cardiologist -   Chest x-ray -  EKG - on chart Stress Test -  ECHO -  Cardiac Cath -   Sleep Study - yes CPAP -   Fasting Blood Sugar -  Checks Blood Sugar _____ times a day  Blood Thinner Instructions: Aspirin Instructions:  Anesthesia review: abn ekg, BMI 61.82 BP elevated at preop , cpap  Patient denies shortness of breath, fever, cough and chest pain at PAT appointment      Asymptomatic at preop   Patient verbalized understanding of instructions that were given to them at the PAT appointment. Patient was also instructed that they will need to review over the PAT instructions again at home before surgery.

## 2018-10-08 ENCOUNTER — Other Ambulatory Visit: Payer: Self-pay

## 2018-10-08 ENCOUNTER — Encounter (HOSPITAL_COMMUNITY): Payer: Self-pay

## 2018-10-08 ENCOUNTER — Encounter (HOSPITAL_COMMUNITY)
Admission: RE | Admit: 2018-10-08 | Discharge: 2018-10-08 | Disposition: A | Discharge status: Home / Self Care | Payer: Medicare Other | Source: Ambulatory Visit | Attending: Orthopaedic Surgery | Admitting: Orthopaedic Surgery

## 2018-10-08 DIAGNOSIS — Z79890 Hormone replacement therapy (postmenopausal): Secondary | ICD-10-CM | POA: Diagnosis not present

## 2018-10-08 DIAGNOSIS — M1990 Unspecified osteoarthritis, unspecified site: Secondary | ICD-10-CM | POA: Diagnosis not present

## 2018-10-08 DIAGNOSIS — Z7951 Long term (current) use of inhaled steroids: Secondary | ICD-10-CM | POA: Diagnosis not present

## 2018-10-08 DIAGNOSIS — Z79899 Other long term (current) drug therapy: Secondary | ICD-10-CM | POA: Diagnosis not present

## 2018-10-08 DIAGNOSIS — Z79891 Long term (current) use of opiate analgesic: Secondary | ICD-10-CM | POA: Diagnosis not present

## 2018-10-08 DIAGNOSIS — Z888 Allergy status to other drugs, medicaments and biological substances status: Secondary | ICD-10-CM | POA: Diagnosis not present

## 2018-10-08 DIAGNOSIS — K219 Gastro-esophageal reflux disease without esophagitis: Secondary | ICD-10-CM | POA: Diagnosis not present

## 2018-10-08 DIAGNOSIS — Z01818 Encounter for other preprocedural examination: Secondary | ICD-10-CM | POA: Insufficient documentation

## 2018-10-08 DIAGNOSIS — E039 Hypothyroidism, unspecified: Secondary | ICD-10-CM | POA: Diagnosis not present

## 2018-10-08 DIAGNOSIS — Z6841 Body Mass Index (BMI) 40.0 and over, adult: Secondary | ICD-10-CM | POA: Diagnosis not present

## 2018-10-08 DIAGNOSIS — Z87891 Personal history of nicotine dependence: Secondary | ICD-10-CM | POA: Diagnosis not present

## 2018-10-08 DIAGNOSIS — G5602 Carpal tunnel syndrome, left upper limb: Secondary | ICD-10-CM | POA: Diagnosis not present

## 2018-10-08 DIAGNOSIS — G4733 Obstructive sleep apnea (adult) (pediatric): Secondary | ICD-10-CM | POA: Diagnosis not present

## 2018-10-08 DIAGNOSIS — J45909 Unspecified asthma, uncomplicated: Secondary | ICD-10-CM | POA: Diagnosis not present

## 2018-10-08 DIAGNOSIS — G629 Polyneuropathy, unspecified: Secondary | ICD-10-CM | POA: Diagnosis not present

## 2018-10-08 LAB — CBC
HCT: 37.8 % (ref 36.0–46.0)
Hemoglobin: 11.4 g/dL — ABNORMAL LOW (ref 12.0–15.0)
MCH: 21.1 pg — ABNORMAL LOW (ref 26.0–34.0)
MCHC: 30.2 g/dL (ref 30.0–36.0)
MCV: 70 fL — ABNORMAL LOW (ref 80.0–100.0)
Platelets: 277 10*3/uL (ref 150–400)
RBC: 5.4 MIL/uL — ABNORMAL HIGH (ref 3.87–5.11)
RDW: 16.7 % — ABNORMAL HIGH (ref 11.5–15.5)
WBC: 6.8 10*3/uL (ref 4.0–10.5)
nRBC: 0 % (ref 0.0–0.2)

## 2018-10-08 LAB — BASIC METABOLIC PANEL
Anion gap: 7 (ref 5–15)
BUN: 12 mg/dL (ref 6–20)
CO2: 29 mmol/L (ref 22–32)
Calcium: 9.1 mg/dL (ref 8.9–10.3)
Chloride: 103 mmol/L (ref 98–111)
Creatinine, Ser: 0.83 mg/dL (ref 0.44–1.00)
GFR calc Af Amer: >60 mL/min (ref >60)
GFR calc non Af Amer: >60 mL/min (ref >60)
Glucose, Bld: 106 mg/dL — ABNORMAL HIGH (ref 70–99)
Potassium: 4.4 mmol/L (ref 3.5–5.1)
Sodium: 139 mmol/L (ref 135–145)

## 2018-10-09 MED ORDER — DEXTROSE 5 % IV SOLN
3.0000 g | INTRAVENOUS | Status: AC
  Administered 2018-10-10: 13:00:00 3 g via INTRAVENOUS
  Filled 2018-10-09: qty 3

## 2018-10-10 ENCOUNTER — Encounter (HOSPITAL_COMMUNITY)
Admission: RE | Disposition: A | Discharge status: Home / Self Care | Payer: Self-pay | Source: Home / Self Care | Attending: Orthopaedic Surgery

## 2018-10-10 ENCOUNTER — Ambulatory Visit (HOSPITAL_COMMUNITY): Payer: Medicare Other | Admitting: Certified Registered"

## 2018-10-10 ENCOUNTER — Encounter (HOSPITAL_COMMUNITY): Payer: Self-pay | Admitting: *Deleted

## 2018-10-10 ENCOUNTER — Ambulatory Visit (HOSPITAL_COMMUNITY)
Admission: RE | Admit: 2018-10-10 | Discharge: 2018-10-10 | Disposition: A | Discharge status: Home / Self Care | Payer: Medicare Other | Attending: Orthopaedic Surgery | Admitting: Orthopaedic Surgery

## 2018-10-10 ENCOUNTER — Ambulatory Visit (HOSPITAL_COMMUNITY): Payer: Medicare Other | Admitting: Physician Assistant

## 2018-10-10 ENCOUNTER — Other Ambulatory Visit: Payer: Self-pay

## 2018-10-10 DIAGNOSIS — Z79890 Hormone replacement therapy (postmenopausal): Secondary | ICD-10-CM | POA: Insufficient documentation

## 2018-10-10 DIAGNOSIS — J45909 Unspecified asthma, uncomplicated: Secondary | ICD-10-CM | POA: Insufficient documentation

## 2018-10-10 DIAGNOSIS — E039 Hypothyroidism, unspecified: Secondary | ICD-10-CM | POA: Insufficient documentation

## 2018-10-10 DIAGNOSIS — G629 Polyneuropathy, unspecified: Secondary | ICD-10-CM | POA: Insufficient documentation

## 2018-10-10 DIAGNOSIS — Z79899 Other long term (current) drug therapy: Secondary | ICD-10-CM | POA: Insufficient documentation

## 2018-10-10 DIAGNOSIS — K219 Gastro-esophageal reflux disease without esophagitis: Secondary | ICD-10-CM | POA: Insufficient documentation

## 2018-10-10 DIAGNOSIS — G5602 Carpal tunnel syndrome, left upper limb: Secondary | ICD-10-CM | POA: Diagnosis not present

## 2018-10-10 DIAGNOSIS — G4733 Obstructive sleep apnea (adult) (pediatric): Secondary | ICD-10-CM | POA: Insufficient documentation

## 2018-10-10 DIAGNOSIS — Z888 Allergy status to other drugs, medicaments and biological substances status: Secondary | ICD-10-CM | POA: Insufficient documentation

## 2018-10-10 DIAGNOSIS — Z6841 Body Mass Index (BMI) 40.0 and over, adult: Secondary | ICD-10-CM | POA: Insufficient documentation

## 2018-10-10 DIAGNOSIS — Z87891 Personal history of nicotine dependence: Secondary | ICD-10-CM | POA: Insufficient documentation

## 2018-10-10 DIAGNOSIS — M1990 Unspecified osteoarthritis, unspecified site: Secondary | ICD-10-CM | POA: Diagnosis not present

## 2018-10-10 DIAGNOSIS — Z7951 Long term (current) use of inhaled steroids: Secondary | ICD-10-CM | POA: Insufficient documentation

## 2018-10-10 DIAGNOSIS — Z79891 Long term (current) use of opiate analgesic: Secondary | ICD-10-CM | POA: Insufficient documentation

## 2018-10-10 HISTORY — PX: CARPAL TUNNEL RELEASE: SHX101

## 2018-10-10 SURGERY — CARPAL TUNNEL RELEASE
Anesthesia: Monitor Anesthesia Care | Site: Hand | Laterality: Left

## 2018-10-10 MED ORDER — BUPIVACAINE HCL (PF) 0.25 % IJ SOLN
INTRAMUSCULAR | Status: DC | PRN
  Administered 2018-10-10: 10 mL

## 2018-10-10 MED ORDER — OXYCODONE HCL 5 MG PO TABS: 5.0000 mg | ORAL_TABLET | Freq: Once | ORAL | Status: DC | PRN

## 2018-10-10 MED ORDER — ACETAMINOPHEN 160 MG/5ML PO SOLN: 325.0000 mg | ORAL | Status: DC | PRN

## 2018-10-10 MED ORDER — ONDANSETRON HCL 4 MG/2ML IJ SOLN: 4.0000 mg | Freq: Once | INTRAMUSCULAR | Status: DC | PRN

## 2018-10-10 MED ORDER — ONDANSETRON HCL 4 MG/2ML IJ SOLN
INTRAMUSCULAR | Status: DC | PRN
  Administered 2018-10-10: 4 mg via INTRAVENOUS

## 2018-10-10 MED ORDER — DEXAMETHASONE SODIUM PHOSPHATE 10 MG/ML IJ SOLN
INTRAMUSCULAR | Status: AC
  Filled 2018-10-10: qty 1

## 2018-10-10 MED ORDER — MEPERIDINE HCL 50 MG/ML IJ SOLN: 6.2500 mg | INTRAMUSCULAR | Status: DC | PRN

## 2018-10-10 MED ORDER — LIDOCAINE HCL (PF) 1 % IJ SOLN
INTRAMUSCULAR | Status: AC
  Filled 2018-10-10: qty 30

## 2018-10-10 MED ORDER — BUPIVACAINE HCL (PF) 0.25 % IJ SOLN
INTRAMUSCULAR | Status: AC
  Filled 2018-10-10: qty 30

## 2018-10-10 MED ORDER — LIDOCAINE HCL 1 % IJ SOLN
INTRAMUSCULAR | Status: DC | PRN
  Administered 2018-10-10: 40 mg via INTRADERMAL

## 2018-10-10 MED ORDER — OXYCODONE HCL 5 MG/5ML PO SOLN: 5.0000 mg | Freq: Once | ORAL | Status: DC | PRN

## 2018-10-10 MED ORDER — ONDANSETRON HCL 4 MG/2ML IJ SOLN
INTRAMUSCULAR | Status: AC
  Filled 2018-10-10: qty 2

## 2018-10-10 MED ORDER — FENTANYL CITRATE (PF) 100 MCG/2ML IJ SOLN: 25.0000 ug | INTRAMUSCULAR | Status: DC | PRN

## 2018-10-10 MED ORDER — ACETAMINOPHEN 325 MG PO TABS: 325.0000 mg | ORAL_TABLET | ORAL | Status: DC | PRN

## 2018-10-10 MED ORDER — LACTATED RINGERS IV SOLN
INTRAVENOUS | Status: DC
  Administered 2018-10-10: 10:00:00 via INTRAVENOUS

## 2018-10-10 MED ORDER — PROPOFOL 500 MG/50ML IV EMUL
INTRAVENOUS | Status: DC | PRN
  Administered 2018-10-10: 100 ug/kg/min via INTRAVENOUS

## 2018-10-10 MED ORDER — HYDROCODONE-ACETAMINOPHEN 7.5-325 MG PO TABS: 1.0000 | ORAL_TABLET | Freq: Four times a day (QID) | ORAL | 0 refills | Status: DC | PRN

## 2018-10-10 MED ORDER — CHLORHEXIDINE GLUCONATE 4 % EX LIQD: 60.0000 mL | Freq: Once | CUTANEOUS | Status: DC

## 2018-10-10 MED ORDER — PROPOFOL 10 MG/ML IV BOLUS
INTRAVENOUS | Status: AC
  Filled 2018-10-10: qty 20

## 2018-10-10 MED ORDER — MIDAZOLAM HCL 5 MG/5ML IJ SOLN
INTRAMUSCULAR | Status: DC | PRN
  Administered 2018-10-10: 2 mg via INTRAVENOUS

## 2018-10-10 MED ORDER — FENTANYL CITRATE (PF) 100 MCG/2ML IJ SOLN
INTRAMUSCULAR | Status: DC | PRN
  Administered 2018-10-10 (×2): 50 ug via INTRAVENOUS

## 2018-10-10 MED ORDER — FENTANYL CITRATE (PF) 100 MCG/2ML IJ SOLN
INTRAMUSCULAR | Status: AC
  Filled 2018-10-10: qty 2

## 2018-10-10 MED ORDER — LIDOCAINE 2% (20 MG/ML) 5 ML SYRINGE
INTRAMUSCULAR | Status: AC
  Filled 2018-10-10: qty 5

## 2018-10-10 MED ORDER — KETOROLAC TROMETHAMINE 30 MG/ML IJ SOLN: 30.0000 mg | Freq: Once | INTRAMUSCULAR | Status: DC | PRN

## 2018-10-10 MED ORDER — LIDOCAINE HCL (PF) 1 % IJ SOLN
INTRAMUSCULAR | Status: DC | PRN
  Administered 2018-10-10: 10 mL

## 2018-10-10 MED ORDER — MIDAZOLAM HCL 2 MG/2ML IJ SOLN
INTRAMUSCULAR | Status: AC
  Filled 2018-10-10: qty 2

## 2018-10-10 SURGICAL SUPPLY — 33 items
BNDG COHESIVE 4X5 TAN STRL (GAUZE/BANDAGES/DRESSINGS) ×3 IMPLANT
BNDG ELASTIC 3X5.8 VLCR STR LF (GAUZE/BANDAGES/DRESSINGS) ×3 IMPLANT
BNDG ESMARK 4X9 LF (GAUZE/BANDAGES/DRESSINGS) ×3 IMPLANT
CORD BIPOLAR FORCEPS 12FT (ELECTRODE) ×3 IMPLANT
COVER MAYO STAND STRL (DRAPES) ×3 IMPLANT
COVER SURGICAL LIGHT HANDLE (MISCELLANEOUS) ×3 IMPLANT
COVER WAND RF STERILE (DRAPES) IMPLANT
CUFF TOURN SGL QUICK 18X4 (TOURNIQUET CUFF) IMPLANT
CUFF TOURN SGL QUICK 24 (TOURNIQUET CUFF)
CUFF TRNQT CYL 24X4X16.5-23 (TOURNIQUET CUFF) IMPLANT
DRAPE SURG 17X11 SM STRL (DRAPES) ×3 IMPLANT
DURAPREP 26ML APPLICATOR (WOUND CARE) ×3 IMPLANT
GAUZE SPONGE 4X4 12PLY STRL (GAUZE/BANDAGES/DRESSINGS) ×3 IMPLANT
GAUZE XEROFORM 1X8 LF (GAUZE/BANDAGES/DRESSINGS) ×5 IMPLANT
GLOVE BIO SURGEON STRL SZ7.5 (GLOVE) ×3 IMPLANT
GLOVE BIOGEL PI IND STRL 8 (GLOVE) ×1 IMPLANT
GLOVE BIOGEL PI INDICATOR 8 (GLOVE) ×2
GLOVE ECLIPSE 8.0 STRL XLNG CF (GLOVE) IMPLANT
GOWN STRL REUS W/TWL XL LVL3 (GOWN DISPOSABLE) ×3 IMPLANT
KIT BASIN OR (CUSTOM PROCEDURE TRAY) ×3 IMPLANT
KIT TURNOVER KIT A (KITS) IMPLANT
NEEDLE HYPO 22GX1.5 SAFETY (NEEDLE) ×3 IMPLANT
NS IRRIG 1000ML POUR BTL (IV SOLUTION) ×3 IMPLANT
PACK ORTHO EXTREMITY (CUSTOM PROCEDURE TRAY) ×3 IMPLANT
PAD CAST 3X4 CTTN HI CHSV (CAST SUPPLIES) ×1 IMPLANT
PADDING CAST COTTON 3X4 STRL (CAST SUPPLIES) ×2
PROTECTOR NERVE ULNAR (MISCELLANEOUS) ×3 IMPLANT
SUCTION FRAZIER HANDLE 10FR (MISCELLANEOUS)
SUCTION TUBE FRAZIER 10FR DISP (MISCELLANEOUS) IMPLANT
SUT ETHILON 3 0 PS 1 (SUTURE) ×3 IMPLANT
SYR CONTROL 10ML LL (SYRINGE) ×3 IMPLANT
TOWEL OR 17X26 10 PK STRL BLUE (TOWEL DISPOSABLE) ×3 IMPLANT
YANKAUER SUCT BULB TIP 10FT TU (MISCELLANEOUS) ×3 IMPLANT

## 2018-10-10 NOTE — Op Note (Signed)
NAME: Melissa Rowe, Melissa Rowe MEDICAL RECORD [id number] ACCOUNT [id number] DATE OF BIRTH:11-09-1965 FACILITY: [facility] LOCATION: [facility] PHYSICIAN:Kaisyn Millea [place], MD  OPERATIVE REPORT  DATE OF PROCEDURE:  10/10/2018  PREOPERATIVE DIAGNOSIS:  Severe carpal tunnel syndrome, left upper extremity.  POSTOPERATIVE DIAGNOSIS:  Severe carpal tunnel syndrome, left upper extremity.  PROCEDURE:  Left open carpal tunnel release.  SURGEON:  Jonn Shingles, MD  ANESTHESIA: 1.  Mask ventilation, IV sedation. 2.  Local injection in the soft tissues of first 1% plain lidocaine followed by 0.25% plain Marcaine.  TOURNIQUET TIME:  Less than 15 minutes.  ESTIMATED BLOOD LOSS:  Minimal.  ANTIBIOTICS:  3 g IV Ancef.  COMPLICATIONS:  None.  INDICATIONS:  The patient is a 53 year old female with severe carpal tunnel syndrome of the left upper extremity verified by clinical exam and nerve conduction studies.  We have recommended open carpal tunnel release.  This is being done as an outpatient  but we are doing it in an inpatient setting based on her morbid obesity with a BMI of over 60.  We had a long and thorough discussion about the anatomy of the transverse carpal ligament.  I talked about the risks and benefits of surgery and I explained  what the surgery involves in detail.  DESCRIPTION OF PROCEDURE:  After informed consent was obtained and appropriate left hand was marked, she was brought to the operating room and placed supine on the operating table, left arm on an arm table.  Nonsterile tourniquet was placed around the  upper left arm and her left hand, wrist and forearm were prepped and draped with DuraPrep and sterile drapes.  A time-out was called to identify correct patient and correct left hand.  Mask ventilation, IV sedation was obtained.  The Esmarch was used to  wrap the hand and tourniquet was inflated to 250 mm of pressure.  I then anesthetized where I would be making the  incision in the palm first with my 1% plain lidocaine followed by my 0.25% plain Marcaine.  We then made an incision directly over the  transverse carpal ligament in the palm and carried this proximally and distally.  We dissected down to the distal edge of the transverse carpal ligament and then slowly and meticulously divided it from distal to proximal while we protected the median  nerve.  We did divide it in its entirety.  We assessed the median nerve and found it to be intact as well as motor branch.  I then irrigated the soft tissue with normal saline solution.  We reapproximated the skin with interrupted 3-0 nylon suture.   Xeroform well-padded sterile dressing was applied.  The tourniquet was let down and her fingers pinkened nicely.  She was able to move the fingers and thumb afterwards.  She was taken to recovery room in stable condition.  All final counts were correct.   There were no complications noted.  TN/NUANCE  D:10/10/2018 T:10/10/2018 JOB:[id number]

## 2018-10-10 NOTE — Discharge Instructions (Signed)
Keep your hand dressing clean and dry. You may use your left hand as comfort allows. Do expect left hand swelling.  Ice and elevate your hand and palm as needed. Leave the dressing on for at least the next 6 days. In 6 days from now you may remove your dressing and get your incision wet in the shower. Starting in 6 days, placing a large Band-Aid over your incision daily.

## 2018-10-10 NOTE — Transfer of Care (Signed)
Immediate Anesthesia Transfer of Care Note  Patient: Melissa Rowe  Procedure(s) Performed: LEFT CARPAL TUNNEL RELEASE (Left Hand)  Patient Location: PACU  Anesthesia Type:MAC  Level of Consciousness: awake, alert , oriented and patient cooperative  Airway & Oxygen Therapy: Patient Spontanous Breathing and Patient connected to face mask oxygen  Post-op Assessment: Report given to RN and Post -op Vital signs reviewed and stable  Post vital signs: Reviewed and stable  Last Vitals:  Vitals Value Taken Time  BP 149/87 10/10/18 1312  Temp    Pulse 85 10/10/18 1314  Resp 12 10/10/18 1314  SpO2 100 % 10/10/18 1314  Vitals shown include unvalidated device data.  Last Pain:  Vitals:   10/10/18 1004  TempSrc:   PainSc: 4       Patients Stated Pain Goal: 3 (67/59/16 3846)  Complications: No apparent anesthesia complications

## 2018-10-10 NOTE — Anesthesia Preprocedure Evaluation (Addendum)
Anesthesia Evaluation  Patient identified by MRN, date of birth, ID band Patient awake    Reviewed: Allergy & Precautions, NPO status , Patient's Chart, lab work & pertinent test results  Airway Mallampati: II       Dental no notable dental hx. (+) Teeth Intact   Pulmonary asthma , sleep apnea and Continuous Positive Airway Pressure Ventilation , former smoker,    Pulmonary exam normal breath sounds clear to auscultation       Cardiovascular Normal cardiovascular exam Rhythm:Regular Rate:Normal     Neuro/Psych negative psych ROS   GI/Hepatic Neg liver ROS, GERD  Medicated,  Endo/Other  Hypothyroidism Morbid obesity  Renal/GU negative Renal ROS     Musculoskeletal   Abdominal (+) + obese,   Peds  Hematology   Anesthesia Other Findings   Reproductive/Obstetrics                            Anesthesia Physical Anesthesia Plan  ASA: III  Anesthesia Plan: MAC   Post-op Pain Management:    Induction:   PONV Risk Score and Plan: Propofol infusion and Midazolam  Airway Management Planned: Nasal Cannula, Natural Airway and Simple Face Mask  Additional Equipment:   Intra-op Plan:   Post-operative Plan:   Informed Consent: I have reviewed the patients History and Physical, chart, labs and discussed the procedure including the risks, benefits and alternatives for the proposed anesthesia with the patient or authorized representative who has indicated his/her understanding and acceptance.       Plan Discussed with: CRNA  Anesthesia Plan Comments:        Anesthesia Quick Evaluation

## 2018-10-10 NOTE — Brief Op Note (Signed)
10/10/2018  1:11 PM  PATIENT:  Melissa Rowe  53 y.o. female  PRE-OPERATIVE DIAGNOSIS:  left carpal tunnel syndrome  POST-OPERATIVE DIAGNOSIS:  left carpal tunnel syndrome  PROCEDURE:  Procedure(s): LEFT CARPAL TUNNEL RELEASE (Left)  SURGEON:  Surgeon(s) and Role:    Mcarthur Rossetti, MD - Primary  ANESTHESIA:   local and IV sedation  EBL:  2 mL   COUNTS:  YES  TOURNIQUET:   Total Tourniquet Time Documented: Upper Arm (Left) - 12 minutes Total: Upper Arm (Left) - 12 minutes   DICTATION: .Other Dictation: Dictation Number [id number]  PLAN OF CARE: Discharge to home after PACU  PATIENT DISPOSITION:  PACU - hemodynamically stable.   Delay start of Pharmacological VTE agent (>24hrs) due to surgical blood loss or risk of bleeding: no

## 2018-10-10 NOTE — H&P (Signed)
Melissa Rowe is an 53 y.o. female.   Chief Complaint:   Left hand pain, weakness, and numbness HPI:   53 yo female with known severe left carpal tunnel syndrome varified by clinical exam and nerve conduction studies.  Past Medical History:  Diagnosis Date  . Anemia sept /oct 2014  . Arthritis   . Asthma   . Constipation   . Diverticulitis   . Dyspnea    with activity  . Environmental allergies   . Frequency of urination   . Gastroenteritis   . GERD (gastroesophageal reflux disease)   . Glaucoma   . Hemorrhoids    external with some bleeding  . Hyperlipidemia   . Hypothyroidism   . Neuropathy   . Obesity   . OSA (obstructive sleep apnea)    wears CPAP night pt does not know settings  . Urgency of urination     Past Surgical History:  Procedure Laterality Date  . BREAST REDUCTION SURGERY Bilateral 12/16/2013   Procedure: BILATERAL BREAST REDUCTION;  Surgeon: Theodoro Kos, DO;  Location: [place];  Service: Plastics;  Laterality: Bilateral;  . BREAST REDUCTION SURGERY Bilateral   . CARPAL TUNNEL RELEASE     right  . CARPAL TUNNEL RELEASE Right   . CHOLECYSTECTOMY  2008  . CHOLECYSTECTOMY    . COLONOSCOPY    . COLONOSCOPY WITH PROPOFOL N/A 02/24/2013   Procedure: COLONOSCOPY WITH PROPOFOL;  Surgeon: Ladene Artist, MD;  Location: [facility];  Service: Endoscopy;  Laterality: N/A;  . KNEE ARTHROSCOPY  03/18/2012   Procedure: ARTHROSCOPY KNEE;  Surgeon: Mcarthur Rossetti, MD;  Location: [place];  Service: Orthopedics;  Laterality: Right;  Right knee arthroscopy with debridement and three compartment chondroplasty  . polyps removal  2007  . REDUCTION MAMMAPLASTY    . TUBAL LIGATION  1992  . tubal ligaton      Family History  Problem Relation Age of Onset  . Heart murmur Mother        rheumatoid arthritis  . Heart attack Mother   . COPD Mother   . Diabetes Mother   . Breast cancer Mother   . Prostate cancer Father   . Kidney failure Father   . Asthma Brother         and mother both as a child   Social History:  reports that she quit smoking about 28 years ago. Her smoking use included cigarettes. She has a 14.00 pack-year smoking history. She has never used smokeless tobacco. She reports previous alcohol use. She reports that she does not use drugs.  Allergies:  Allergies  Allergen Reactions  . Lamictal [Lamotrigine] Itching    Migraine   . Other Rash    Powder inside latex gloves - rash and sores     Medications Prior to Admission  Medication Sig Dispense Refill  . albuterol (PROVENTIL) (2.5 MG/3ML) 0.083% nebulizer solution USE 1 VIAL IN NEBULIZER EVERY 6 HOURS FOR SHORTNESS OF BREATH/WHEEZING AND AS NEEDED (Patient taking differently: Take 2.5 mg by nebulization every 6 (six) hours as needed for wheezing or shortness of breath. ) 30 vial 10  . albuterol (VENTOLIN HFA) 108 (90 Base) MCG/ACT inhaler Inhale 2 puffs into the lungs every 6 (six) hours as needed for wheezing or shortness of breath.    . Calcium Carb-Cholecalciferol (CALCIUM 600 + D PO) Take 1 tablet by mouth daily.    . clonazePAM (KLONOPIN) 1 MG tablet TAKE 1 TABLET BY MOUTH AT  BEDTIME (Patient taking differently:  Take 1 mg by mouth at bedtime. ) 90 tablet 1  . cyclobenzaprine (FLEXERIL) 10 MG tablet Take 1 tablet (10 mg total) by mouth 2 (two) times daily as needed for muscle spasms. 20 tablet 0  . gabapentin (NEURONTIN) 800 MG tablet TAKE 1 TABLET 4 TIMES  DAILY. PLEASE CALL [phone]  [phone] TO SCHEDULE FOLLOW UP. (Patient taking differently: Take 800 mg by mouth 4 (four) times daily. ) 360 tablet 3  . levothyroxine (SYNTHROID) 200 MCG tablet Take 200 mcg by mouth daily before breakfast.    . lidocaine (XYLOCAINE) 5 % ointment Dispense 2 x 50 g tubes  Apply to right thigh twice a day (Patient taking differently: Apply 1 application topically 2 (two) times daily. ) 100 g 3  . modafinil (PROVIGIL) 200 MG tablet TAKE 1 TABLET BY MOUTH  DAILY (Patient taking differently: Take 200 mg by  mouth daily. ) 90 tablet 1  . mometasone-formoterol (DULERA) 200-5 MCG/ACT AERO Inhale 2 puffs into the lungs 2 (two) times daily. 1 Inhaler 3  . montelukast (SINGULAIR) 10 MG tablet Take 10 mg by mouth at bedtime.     . Multiple Vitamin (MULTIVITAMIN) capsule Take 1 capsule by mouth daily.     . naproxen sodium (ALEVE) 220 MG tablet Take 440 mg by mouth daily as needed (knee pain).    Earney Navy Bicarbonate (ZEGERID OTC) 20-1100 MG CAPS capsule Take 1 capsule by mouth daily before breakfast.    . Oxcarbazepine (TRILEPTAL) 300 MG tablet Take 2 tablets (600 mg total) by mouth 2 (two) times daily. 360 tablet 3  . polyethylene glycol (MIRALAX / GLYCOLAX) packet Take 17 g by mouth daily as needed for moderate constipation.     [name] spironolactone (ALDACTONE) 25 MG tablet Take 25 mg by mouth daily.    . traMADol (ULTRAM) 50 MG tablet Take 50 mg by mouth 3 (three) times daily.       No results found for this or any previous visit (from the past 48 hour(s)). No results found.  ROS  Blood pressure (!) 159/87, pulse 98, temperature 98.6 F (37 C), temperature source Oral, resp. rate 20, height 5\' 6"  (1.676 m), weight (!) 173.7 kg, last menstrual period 04/06/2015, SpO2 100 %. Physical Exam  Constitutional: She is oriented to person, place, and time. She appears well-developed and well-nourished.  HENT:  Head: Normocephalic and atraumatic.  Eyes: Pupils are equal, round, and reactive to light.  Neck: Normal range of motion. Neck supple.  Cardiovascular: Normal rate.  Respiratory: Effort normal.  GI: Soft.  Musculoskeletal:     Left hand: She exhibits tenderness. Decreased sensation noted. Decreased sensation is present in the medial distribution. Decreased strength noted.  Neurological: She is alert and oriented to person, place, and time.  Skin: Skin is warm and dry.  Psychiatric: She has a normal mood and affect.     Assessment/Plan Left carpal tunnel syndrome  To the OR today as  an outpatient for a left open carpal tunnel release.  Risks and benefits have been discussed in detail  Mcarthur Rossetti, MD 10/10/2018, 11:32 AM

## 2018-10-13 ENCOUNTER — Encounter (HOSPITAL_COMMUNITY): Payer: Self-pay | Admitting: Orthopaedic Surgery

## 2018-10-16 ENCOUNTER — Ambulatory Visit: Payer: Medicare Other | Admitting: Adult Health

## 2018-10-17 ENCOUNTER — Encounter: Payer: Self-pay | Admitting: Adult Health

## 2018-10-17 ENCOUNTER — Ambulatory Visit (INDEPENDENT_AMBULATORY_CARE_PROVIDER_SITE_OTHER): Payer: Medicare Other | Admitting: Adult Health

## 2018-10-17 ENCOUNTER — Other Ambulatory Visit: Payer: Self-pay

## 2018-10-17 DIAGNOSIS — J4540 Moderate persistent asthma, uncomplicated: Secondary | ICD-10-CM | POA: Diagnosis not present

## 2018-10-17 DIAGNOSIS — G4733 Obstructive sleep apnea (adult) (pediatric): Secondary | ICD-10-CM | POA: Diagnosis not present

## 2018-10-17 MED ORDER — ALBUTEROL SULFATE HFA 108 (90 BASE) MCG/ACT IN AERS: 2.0000 | INHALATION_SPRAY | Freq: Four times a day (QID) | RESPIRATORY_TRACT | 1 refills | Status: DC | PRN

## 2018-10-17 MED ORDER — MOMETASONE FURO-FORMOTEROL FUM 200-5 MCG/ACT IN AERO: 2.0000 | INHALATION_SPRAY | Freq: Two times a day (BID) | RESPIRATORY_TRACT | 1 refills | Status: DC

## 2018-10-17 NOTE — Addendum Note (Signed)
Addended by: Parke Poisson E on: 10/17/2018 04:37 PM   Modules accepted: Orders

## 2018-10-17 NOTE — Progress Notes (Signed)
@Patient  ID: Melissa Rowe, female    DOB: Nov 26, 1965, 53 y.o.   MRN: [id number]  Chief Complaint  Patient presents with  . Follow-up    OSA and Asthma     Referring provider: Marton Redwood, MD  HPI: 53 year old female morbidly obese followed for mild obstructive sleep apnea and asthma   TEST/EVENTS :   05/05/2008: Spirometry suggests restriction. CXR 04/19/2008  Normal. CT 02/10/2008  - Neg for PE. No infiltrates -> Methacholine Challenge Test: 06/25/2008: Positive PC20 between 1-4  - 10/11/2009: fev1 2L/70% -> start pulmicort  -11/08/2009: fev1 2.24L/78% and better - > switch to QVAR due to cost - May 2012: fev1 2.32L/85% and normal but symptomactic -> change QVAR to dulera sample,  6day pred burst - Jul 13, 2010: Fev1 2.5L/94%, FVC 3.21/98% and improved -> continue dulera -10/26/2010 FEV1 2.33 L/87%, FVC 2.87 L/88% >cont on dulera   07/2018 Home sleep test confirms obstructive sleep apnea, averaging 9 apneas/ hour with drops in blood oxygen level.  10/17/2018 Follow up : OSA and Asthma  Patient presents for a 17-month follow-up.  Patient has underlying mild obstructive sleep apnea.  Recently had a repeat home sleep study to restart CPAP.  Sleep study June 2020 showed mild sleep apnea with AHI at 9/hour.  With desaturations.  She was recently received her new CPAP machine.  Says she is feeling so much better.  Feels more rested with decreased daytime sleepiness.  Download shows excellent compliance with 100% usage.  Daily average usage at 9.5 hours.  Patient is on auto CPAP 5 to 20 cm H2O.  AHI is 2.6.  Patient has underlying mild persistent asthma.  She is on Dulera twice daily.  She denies any increased wheezing cough.  No increased albuterol use.  Allergies  Allergen Reactions  . Lamictal [Lamotrigine] Itching    Migraine   . Other Rash    Powder inside latex gloves - rash and sores     Immunization History  Administered Date(s) Administered  . Influenza Split 10/26/2010,  02/12/2012, 12/13/2012, 11/12/2017  . Influenza Whole 11/08/2009  . Influenza,inj,Quad PF,6+ Mos 10/14/2014  . Tdap 09/22/2013    Past Medical History:  Diagnosis Date  . Anemia sept /oct 2014  . Arthritis   . Asthma   . Constipation   . Diverticulitis   . Dyspnea    with activity  . Environmental allergies   . Frequency of urination   . Gastroenteritis   . GERD (gastroesophageal reflux disease)   . Glaucoma   . Hemorrhoids    external with some bleeding  . Hyperlipidemia   . Hypothyroidism   . Neuropathy   . Obesity   . OSA (obstructive sleep apnea)    wears CPAP night pt does not know settings  . Urgency of urination     Tobacco History: Social History   Tobacco Use  Smoking Status Former Smoker  . Packs/day: 2.00  . Years: 7.00  . Pack years: 14.00  . Types: Cigarettes  . Quit date: 02/12/1990  . Years since quitting: 28.6  Smokeless Tobacco Never Used   Counseling given: Not Answered   Outpatient Medications Prior to Visit  Medication Sig Dispense Refill  . albuterol (PROVENTIL) (2.5 MG/3ML) 0.083% nebulizer solution USE 1 VIAL IN NEBULIZER EVERY 6 HOURS FOR SHORTNESS OF BREATH/WHEEZING AND AS NEEDED (Patient taking differently: Take 2.5 mg by nebulization every 6 (six) hours as needed for wheezing or shortness of breath. ) 30 vial 10  . albuterol (  VENTOLIN HFA) 108 (90 Base) MCG/ACT inhaler Inhale 2 puffs into the lungs every 6 (six) hours as needed for wheezing or shortness of breath.    . Calcium Carb-Cholecalciferol (CALCIUM 600 + D PO) Take 1 tablet by mouth daily.    . clonazePAM (KLONOPIN) 1 MG tablet TAKE 1 TABLET BY MOUTH AT  BEDTIME (Patient taking differently: Take 1 mg by mouth at bedtime. ) 90 tablet 1  . cyclobenzaprine (FLEXERIL) 10 MG tablet Take 1 tablet (10 mg total) by mouth 2 (two) times daily as needed for muscle spasms. 20 tablet 0  . gabapentin (NEURONTIN) 800 MG tablet TAKE 1 TABLET 4 TIMES  DAILY. PLEASE CALL [phone]  [phone] TO SCHEDULE  FOLLOW UP. (Patient taking differently: Take 800 mg by mouth 4 (four) times daily. ) 360 tablet 3  . HYDROcodone-acetaminophen (NORCO) 7.5-325 MG tablet Take 1-2 tablets by mouth every 6 (six) hours as needed for moderate pain. 30 tablet 0  . levothyroxine (SYNTHROID) 200 MCG tablet Take 200 mcg by mouth daily before breakfast.    . lidocaine (XYLOCAINE) 5 % ointment Dispense 2 x 50 g tubes  Apply to right thigh twice a day (Patient taking differently: Apply 1 application topically 2 (two) times daily. ) 100 g 3  . modafinil (PROVIGIL) 200 MG tablet TAKE 1 TABLET BY MOUTH  DAILY (Patient taking differently: Take 200 mg by mouth daily. ) 90 tablet 1  . mometasone-formoterol (DULERA) 200-5 MCG/ACT AERO Inhale 2 puffs into the lungs 2 (two) times daily. 1 Inhaler 3  . montelukast (SINGULAIR) 10 MG tablet Take 10 mg by mouth at bedtime.     . Multiple Vitamin (MULTIVITAMIN) capsule Take 1 capsule by mouth daily.     . naproxen sodium (ALEVE) 220 MG tablet Take 440 mg by mouth daily as needed (knee pain).    Earney Navy Bicarbonate (ZEGERID OTC) 20-1100 MG CAPS capsule Take 1 capsule by mouth daily before breakfast.    . Oxcarbazepine (TRILEPTAL) 300 MG tablet Take 2 tablets (600 mg total) by mouth 2 (two) times daily. 360 tablet 3  . polyethylene glycol (MIRALAX / GLYCOLAX) packet Take 17 g by mouth daily as needed for moderate constipation.     [name] spironolactone (ALDACTONE) 25 MG tablet Take 25 mg by mouth daily.     No facility-administered medications prior to visit.      Review of Systems:   Constitutional:   No  weight loss, night sweats,  Fevers, chills,  +fatigue, or  lassitude.  HEENT:   No headaches,  Difficulty swallowing,  Tooth/dental problems, or  Sore throat,                No sneezing, itching, ear ache, nasal congestion, post nasal drip,   CV:  No chest pain,  Orthopnea, PND, +swelling in lower extremities No , anasarca, dizziness, palpitations, syncope.   GI  No  heartburn, indigestion, abdominal pain, nausea, vomiting, diarrhea, change in bowel habits, loss of appetite, bloody stools.   Resp No excess mucus, no productive cough,  No non-productive cough,  No coughing up of blood.  No change in color of mucus.  No wheezing.  No chest wall deformity  Skin: no rash or lesions.  GU: no dysuria, change in color of urine, no urgency or frequency.  No flank pain, no hematuria   MS:  No joint pain or swelling.  No decreased range of motion.  No back pain.    Physical Exam  BP 132/80 (  BP Location: Right Arm, Cuff Size: Large)   Pulse 92   Temp 97.7 F (36.5 C) (Oral)   Ht 5\' 6"  (1.676 m)   Wt (!) 379 lb 9.6 oz (172.2 kg)   LMP 04/06/2015   SpO2 98%   BMI 61.27 kg/m   GEN: A/Ox3; pleasant , NAD, morbidly obese   HEENT:  [place]/AT, , NOSE-clear, THROAT-clear, no lesions, no postnasal drip or exudate noted.  Class II-III MP airway  NECK:  Supple w/ fair ROM; no JVD; normal carotid impulses w/o bruits; no thyromegaly or nodules palpated; no lymphadenopathy.    RESP  Clear  P & A; w/o, wheezes/ rales/ or rhonchi. no accessory muscle use, no dullness to percussion  CARD:  RRR, no m/r/g, trace peripheral edema, pulses intact, no cyanosis or clubbing.  GI:   Soft & nt; nml bowel sounds; no organomegaly or masses detected.   Musco: Warm bil, no deformities or joint swelling noted.   Neuro: alert, no focal deficits noted.    Skin: Warm, no lesions or rashes    Lab Results:  CBC   BNP No results found for: BNP   No flowsheet data found.  Lab Results  Component Value Date   NITRICOXIDE 29 02/23/2015        Assessment & Plan:   Moderate persistent asthma Mild to moderate persistent asthma currently well controlled on current regimen no changes  Obstructive sleep apnea Mild obstructive sleep apnea excellent control and compliance on CPAP  Plan  Patient Instructions  Continue on CPAP At bedtime .  Work on healthy weight .  Do  not drive sleepy .  Continue on Dulera 2 puffs Twice daily  , rinse after use.  Continue on Singulair 10mg  daily  Follow up with Dr. Annamaria Boots  6 months and As needed       MORBID OBESITY Healthy weight loss discussed     Rexene Edison, NP 10/17/2018

## 2018-10-17 NOTE — Assessment & Plan Note (Signed)
Healthy weight loss discussed 

## 2018-10-17 NOTE — Assessment & Plan Note (Signed)
Mild to moderate persistent asthma currently well controlled on current regimen no changes

## 2018-10-17 NOTE — Patient Instructions (Addendum)
Continue on CPAP At bedtime .  Work on healthy weight .  Do not drive sleepy .  Continue on Dulera 2 puffs Twice daily  , rinse after use.  Continue on Singulair 10mg  daily  Follow up with Dr. Annamaria Boots  6 months and As needed

## 2018-10-17 NOTE — Assessment & Plan Note (Signed)
Mild obstructive sleep apnea excellent control and compliance on CPAP  Plan  Patient Instructions  Continue on CPAP At bedtime .  Work on healthy weight .  Do not drive sleepy .  Continue on Dulera 2 puffs Twice daily  , rinse after use.  Continue on Singulair 10mg  daily  Follow up with Dr. Annamaria Boots  6 months and As needed

## 2018-10-21 NOTE — Anesthesia Postprocedure Evaluation (Signed)
Anesthesia Post Note  Patient: Melissa Rowe  Procedure(s) Performed: LEFT CARPAL TUNNEL RELEASE (Left Hand)     Patient location during evaluation: PACU Anesthesia Type: MAC Level of consciousness: awake Pain management: pain level controlled Vital Signs Assessment: post-procedure vital signs reviewed and stable Respiratory status: spontaneous breathing Cardiovascular status: stable Postop Assessment: no apparent nausea or vomiting Anesthetic complications: no    Last Vitals:  Vitals:   10/10/18 1400 10/10/18 1424  BP: (!) 150/88 (!) 148/80  Pulse: 88 88  Resp: (!) 21 15  Temp:  36.7 C  SpO2: 100% 95%    Last Pain:  Vitals:   10/10/18 1424  TempSrc:   PainSc: 0-No pain   Pain Goal: Patients Stated Pain Goal: 3 (10/10/18 1004)                 Huston Foley

## 2018-10-23 ENCOUNTER — Telehealth: Payer: Self-pay | Admitting: Adult Health

## 2018-10-23 MED ORDER — ALBUTEROL SULFATE HFA 108 (90 BASE) MCG/ACT IN AERS: 2.0000 | INHALATION_SPRAY | Freq: Four times a day (QID) | RESPIRATORY_TRACT | 1 refills | Status: DC | PRN

## 2018-10-23 NOTE — Telephone Encounter (Signed)
Received fax from [facility]:   Ventolin HFA is not covered under your patient's insurance coverage.  We are requesting approval to dispense a covered alternative.  Please include strength, directions, quantity (90 day supply is preferred, if appropriate) and number of refills.  Covered alternatives: Proair HFA  If you response via electronic Rx please reference the order number in the note section the eRx to ensure accurate processing of your script.   Rx sent with order number Med list updated  Will sign off

## 2018-10-27 ENCOUNTER — Ambulatory Visit (INDEPENDENT_AMBULATORY_CARE_PROVIDER_SITE_OTHER): Payer: Medicare Other | Admitting: Physician Assistant

## 2018-10-27 ENCOUNTER — Encounter: Payer: Self-pay | Admitting: Physician Assistant

## 2018-10-27 ENCOUNTER — Other Ambulatory Visit: Payer: Self-pay

## 2018-10-27 DIAGNOSIS — G5602 Carpal tunnel syndrome, left upper limb: Secondary | ICD-10-CM

## 2018-10-27 NOTE — Progress Notes (Signed)
HPI: Melissa Rowe returns today with 2-week status post left carpal tunnel release.  She is overall doing well.  She is having no numbness or tingling.  States the pain up her arm is dissipated.  She is wanting her stitches out today.  Physical exam: Left knee wound is well approximated with interrupted nylon sutures.  No signs of gross infection.  Full sensation throughout the hand to light touch.  Radial pulses intact.  Impression: 2-week status post left carpal tunnel release  Plan: At this point time most proximal sutures removed.  The remaining sutures are left.  We will see her back in 1 week for removal of his stitches.  Discussed with her that she should put a Steri-Strip over the proximal end of the incision where this was removed to keep it from dehiscing.  She will wash the hand with an antibacterial soap.  Keep it clean and dry.  Questions encouraged and answered

## 2018-11-03 ENCOUNTER — Ambulatory Visit (INDEPENDENT_AMBULATORY_CARE_PROVIDER_SITE_OTHER): Payer: Medicare Other | Admitting: Physician Assistant

## 2018-11-03 ENCOUNTER — Encounter: Payer: Self-pay | Admitting: Physician Assistant

## 2018-11-03 VITALS — Ht 66.0 in | Wt 379.0 lb

## 2018-11-03 DIAGNOSIS — G5602 Carpal tunnel syndrome, left upper limb: Secondary | ICD-10-CM

## 2018-11-03 NOTE — Progress Notes (Signed)
HPI: Melissa Rowe returns today 3-week status post left carpal tunnel release.  She is overall doing well.  She has no complaints.  She is here today mainly for suture removal.  She states she is having any numbness tingling in the hand.  Physical exam: Left hand sutures well approximate the incision.  No evidence of wound dehiscence or infection.  Left hand neurovascular intact.  Impression: 3-week status post left carpal tunnel release  Plan: Sutures removed.  She will work on scar tissue mobilization.  Follow-up with [place] in 4 weeks to see how she is doing regards to numbness tingling and hand how the wound is healing.  Questions were encouraged and answered

## 2018-11-04 ENCOUNTER — Other Ambulatory Visit: Payer: Self-pay | Admitting: Neurology

## 2018-12-01 ENCOUNTER — Ambulatory Visit: Payer: Medicare Other | Admitting: Physician Assistant

## 2018-12-01 ENCOUNTER — Ambulatory Visit (INDEPENDENT_AMBULATORY_CARE_PROVIDER_SITE_OTHER): Payer: Medicare Other | Admitting: Physician Assistant

## 2018-12-01 ENCOUNTER — Encounter: Payer: Self-pay | Admitting: Physician Assistant

## 2018-12-01 ENCOUNTER — Other Ambulatory Visit: Payer: Self-pay

## 2018-12-01 DIAGNOSIS — M1711 Unilateral primary osteoarthritis, right knee: Secondary | ICD-10-CM | POA: Diagnosis not present

## 2018-12-01 DIAGNOSIS — M1712 Unilateral primary osteoarthritis, left knee: Secondary | ICD-10-CM | POA: Diagnosis not present

## 2018-12-01 DIAGNOSIS — G5602 Carpal tunnel syndrome, left upper limb: Secondary | ICD-10-CM

## 2018-12-01 MED ORDER — LIDOCAINE HCL 1 % IJ SOLN
3.0000 mL | INTRAMUSCULAR | Status: AC | PRN
  Administered 2018-12-01: 15:00:00 3 mL

## 2018-12-01 MED ORDER — METHYLPREDNISOLONE ACETATE 40 MG/ML IJ SUSP
40.0000 mg | INTRAMUSCULAR | Status: AC | PRN
  Administered 2018-12-01: 15:00:00 40 mg via INTRA_ARTICULAR

## 2018-12-01 NOTE — Progress Notes (Signed)
Office Visit Note   Patient: Melissa Rowe           Date of Birth: Mar 10, 1965           MRN: [id number] Visit Date: 12/01/2018              Requested by: Marton Redwood, MD [id number] [place],  [place] [address] PCP: Marton Redwood, MD   Assessment & Plan: Visit Diagnoses:  1. Unilateral primary osteoarthritis, left knee   2. Unilateral primary osteoarthritis, right knee   3. Left carpal tunnel syndrome     Plan: We will have her continue to work on scar tissue mobilization left hand.  In regards to both knees she understands she needs to wait at least 6 months between supplemental injections and 3 months between cortisone injections.  She will call our office in late January to get approval for the supplemental injections in both knees.  Questions encouraged and answered at length.  Follow-Up Instructions: Return for Supplemental injection 03/2019.   Orders:  Orders Placed This Encounter  Procedures  . Large Joint Inj: bilateral knee   No orders of the defined types were placed in this encounter.     Procedures: Large Joint Inj: bilateral knee on 12/01/2018 2:59 PM Indications: pain Details: 22 G 1.5 in needle, anterolateral approach  Arthrogram: No  Medications (Right): 3 mL lidocaine 1 %; 40 mg methylPREDNISolone acetate 40 MG/ML Medications (Left): 3 mL lidocaine 1 %; 40 mg methylPREDNISolone acetate 40 MG/ML Outcome: tolerated well, no immediate complications Procedure, treatment alternatives, risks and benefits explained, specific risks discussed. Consent was given by the patient. Immediately prior to procedure a time out was called to verify the correct patient, procedure, equipment, support staff and site/side marked as required. Patient was prepped and draped in the usual sterile fashion.       Clinical Data: No additional findings.   Subjective: Chief Complaint  Patient presents with  . Left Hand - Follow-up    HPI Melissa Rowe returns today 7  weeks status post left total tunnel release.  She is overall doing well in regards to the hand some soreness and some sensitivity but no numbness tingling.  She is requesting cortisone injections both knees.  She has Arthritis of both knees.  She is having difficulty getting around due to the knee pain bilaterally.  No new injury to either knee. Review of Systems Negative for fevers chills shortness of breath chest pain  Objective: Vital Signs: LMP 04/06/2015   Physical Exam General: Well-developed well-nourished female no acute distress Ortho Exam Left hand full sensation full motor.  Surgical incisions healing well.  Slight sensitivity proximal incision but no signs of infection. Bilateral knees good range of motion both knees.  No abnormal warmth erythema or effusion of either knee. Specialty Comments:  No specialty comments available.  Imaging: No results found.   PMFS History: Patient Active Problem List   Diagnosis Date Noted  . Left carpal tunnel syndrome 09/23/2018  . Arm numbness left 06/16/2018  . Excessive daytime sleepiness 12/16/2017  . Unilateral primary osteoarthritis, left knee 08/14/2016  . Unilateral primary osteoarthritis, right knee 08/14/2016  . Chronic pain of both knees 08/14/2016  . REM behavioral disorder 08/14/2016  . Primary osteoarthritis of both knees 06/14/2016  . Allodynia 03/26/2014  . Meralgia paraesthetica 02/26/2014  . Degeneration of lumbar or lumbosacral intervertebral disc 02/26/2014  . Symptomatic mammary hypertrophy 12/16/2013  . Acute bronchitis 04/02/2013  . Flu-like symptoms 04/02/2013  .  Blood in stool 02/24/2013  . Iron deficiency anemia, unspecified 02/24/2013  . Asthma with acute exacerbation 12/28/2012  . Post-nasal drainage 09/02/2012  . Arthritis of knee, right 03/18/2012  . Preoperative respiratory examination 03/14/2012  . Preop respiratory exam 02/28/2012  . Acute URI 02/12/2012  . Vitamin D deficiency 09/21/2010  .  MORBID OBESITY 10/14/2008  . Obstructive sleep apnea 10/14/2008  . Moderate persistent asthma 06/02/2008   Past Medical History:  Diagnosis Date  . Anemia sept /oct 2014  . Arthritis   . Asthma   . Constipation   . Diverticulitis   . Dyspnea    with activity  . Environmental allergies   . Frequency of urination   . Gastroenteritis   . GERD (gastroesophageal reflux disease)   . Glaucoma   . Hemorrhoids    external with some bleeding  . Hyperlipidemia   . Hypothyroidism   . Neuropathy   . Obesity   . OSA (obstructive sleep apnea)    wears CPAP night pt does not know settings  . Urgency of urination     Family History  Problem Relation Age of Onset  . Heart murmur Mother        rheumatoid arthritis  . Heart attack Mother   . COPD Mother   . Diabetes Mother   . Breast cancer Mother   . Prostate cancer Father   . Kidney failure Father   . Asthma Brother        and mother both as a child    Past Surgical History:  Procedure Laterality Date  . BREAST REDUCTION SURGERY Bilateral 12/16/2013   Procedure: BILATERAL BREAST REDUCTION;  Surgeon: Theodoro Kos, DO;  Location: [facility];  Service: Plastics;  Laterality: Bilateral;  . BREAST REDUCTION SURGERY Bilateral   . CARPAL TUNNEL RELEASE     right  . CARPAL TUNNEL RELEASE Right   . CARPAL TUNNEL RELEASE Left 10/10/2018   Procedure: LEFT CARPAL TUNNEL RELEASE;  Surgeon: Mcarthur Rossetti, MD;  Location: [facility];  Service: Orthopedics;  Laterality: Left;  . CHOLECYSTECTOMY  2008  . CHOLECYSTECTOMY    . COLONOSCOPY    . COLONOSCOPY WITH PROPOFOL N/A 02/24/2013   Procedure: COLONOSCOPY WITH PROPOFOL;  Surgeon: Ladene Artist, MD;  Location: [facility];  Service: Endoscopy;  Laterality: N/A;  . KNEE ARTHROSCOPY  03/18/2012   Procedure: ARTHROSCOPY KNEE;  Surgeon: Mcarthur Rossetti, MD;  Location: [facility];  Service: Orthopedics;  Laterality: Right;  Right knee arthroscopy with debridement and three compartment chondroplasty   . polyps removal  2007  . REDUCTION MAMMAPLASTY    . TUBAL LIGATION  1992  . tubal ligaton     Social History   Occupational History  . 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 Smoking status: Former Smoker    Packs/day: 2.00    Years: 7.00    Pack years: 14.00    Types: Cigarettes    Quit date: 02/12/1990    Years since quitting: 28.8  . Smokeless tobacco: Never Used  Substance and Sexual Activity  . Alcohol use: Not Currently    Comment: rare  . Drug use: No  . Sexual activity: Not Currently

## 2018-12-07 ENCOUNTER — Other Ambulatory Visit: Payer: Self-pay | Admitting: Neurology

## 2018-12-28 ENCOUNTER — Other Ambulatory Visit: Payer: Self-pay | Admitting: Neurology

## 2019-01-19 ENCOUNTER — Other Ambulatory Visit: Payer: Self-pay | Admitting: Neurology

## 2019-01-20 ENCOUNTER — Other Ambulatory Visit: Payer: Self-pay | Admitting: Neurology

## 2019-01-26 ENCOUNTER — Other Ambulatory Visit: Payer: Self-pay | Admitting: Adult Health

## 2019-01-26 ENCOUNTER — Other Ambulatory Visit: Payer: Self-pay | Admitting: Neurology

## 2019-01-28 ENCOUNTER — Emergency Department (HOSPITAL_COMMUNITY): Payer: Medicare Other

## 2019-01-28 ENCOUNTER — Encounter (HOSPITAL_COMMUNITY): Payer: Self-pay

## 2019-01-28 ENCOUNTER — Emergency Department (HOSPITAL_COMMUNITY)
Admission: EM | Admit: 2019-01-28 | Discharge: 2019-01-28 | Disposition: A | Discharge status: Home / Self Care | Payer: Medicare Other | Attending: Emergency Medicine | Admitting: Emergency Medicine

## 2019-01-28 DIAGNOSIS — R109 Unspecified abdominal pain: Secondary | ICD-10-CM | POA: Diagnosis not present

## 2019-01-28 DIAGNOSIS — E039 Hypothyroidism, unspecified: Secondary | ICD-10-CM | POA: Diagnosis not present

## 2019-01-28 DIAGNOSIS — Z87891 Personal history of nicotine dependence: Secondary | ICD-10-CM | POA: Diagnosis not present

## 2019-01-28 DIAGNOSIS — Z79899 Other long term (current) drug therapy: Secondary | ICD-10-CM | POA: Diagnosis not present

## 2019-01-28 DIAGNOSIS — J45909 Unspecified asthma, uncomplicated: Secondary | ICD-10-CM | POA: Insufficient documentation

## 2019-01-28 LAB — URINALYSIS, ROUTINE W REFLEX MICROSCOPIC
Bacteria, UA: NONE SEEN
Bilirubin Urine: NEGATIVE
Glucose, UA: NEGATIVE mg/dL
Hgb urine dipstick: NEGATIVE
Ketones, ur: 20 mg/dL — AB
Leukocytes,Ua: NEGATIVE
Nitrite: NEGATIVE
Protein, ur: 30 mg/dL — AB
Specific Gravity, Urine: 1.018 (ref 1.005–1.030)
pH: 7 (ref 5.0–8.0)

## 2019-01-28 LAB — CBC
HCT: 41.5 % (ref 36.0–46.0)
Hemoglobin: 12.8 g/dL (ref 12.0–15.0)
MCH: 21 pg — ABNORMAL LOW (ref 26.0–34.0)
MCHC: 30.8 g/dL (ref 30.0–36.0)
MCV: 68 fL — ABNORMAL LOW (ref 80.0–100.0)
Platelets: 409 10*3/uL — ABNORMAL HIGH (ref 150–400)
RBC: 6.1 MIL/uL — ABNORMAL HIGH (ref 3.87–5.11)
RDW: 15.4 % (ref 11.5–15.5)
WBC: 7.3 10*3/uL (ref 4.0–10.5)
nRBC: 0 % (ref 0.0–0.2)

## 2019-01-28 LAB — COMPREHENSIVE METABOLIC PANEL
ALT: 26 U/L (ref 0–44)
AST: 24 U/L (ref 15–41)
Albumin: 4.1 g/dL (ref 3.5–5.0)
Alkaline Phosphatase: 76 U/L (ref 38–126)
Anion gap: 13 (ref 5–15)
BUN: 6 mg/dL (ref 6–20)
CO2: 22 mmol/L (ref 22–32)
Calcium: 9.8 mg/dL (ref 8.9–10.3)
Chloride: 105 mmol/L (ref 98–111)
Creatinine, Ser: 0.65 mg/dL (ref 0.44–1.00)
GFR calc Af Amer: >60 mL/min (ref >60)
GFR calc non Af Amer: >60 mL/min (ref >60)
Glucose, Bld: 104 mg/dL — ABNORMAL HIGH (ref 70–99)
Potassium: 3.4 mmol/L — ABNORMAL LOW (ref 3.5–5.1)
Sodium: 140 mmol/L (ref 135–145)
Total Bilirubin: 0.4 mg/dL (ref 0.3–1.2)
Total Protein: 8 g/dL (ref 6.5–8.1)

## 2019-01-28 LAB — LIPASE, BLOOD: Lipase: 27 U/L (ref 11–51)

## 2019-01-28 LAB — I-STAT BETA HCG BLOOD, ED (MC, WL, AP ONLY): I-stat hCG, quantitative: <5 m[IU]/mL (ref <5)

## 2019-01-28 MED ORDER — MORPHINE SULFATE (PF) 4 MG/ML IV SOLN
4.0000 mg | Freq: Once | INTRAVENOUS | Status: AC
  Administered 2019-01-28: 4 mg via INTRAVENOUS
  Filled 2019-01-28: qty 1

## 2019-01-28 MED ORDER — LIDOCAINE 5 % EX PTCH: 1.0000 | MEDICATED_PATCH | CUTANEOUS | 0 refills | Status: DC

## 2019-01-28 MED ORDER — IOHEXOL 300 MG/ML  SOLN
100.0000 mL | Freq: Once | INTRAMUSCULAR | Status: AC | PRN
  Administered 2019-01-28: 100 mL via INTRAVENOUS

## 2019-01-28 MED ORDER — HYDROCODONE-ACETAMINOPHEN 5-325 MG PO TABS
1.0000 | ORAL_TABLET | Freq: Once | ORAL | Status: AC
  Administered 2019-01-28: 1 via ORAL
  Filled 2019-01-28: qty 1

## 2019-01-28 MED ORDER — TIZANIDINE HCL 2 MG PO TABS: 2.0000 mg | ORAL_TABLET | Freq: Three times a day (TID) | ORAL | 0 refills | Status: DC | PRN

## 2019-01-28 MED ORDER — HYDROCODONE-ACETAMINOPHEN 5-325 MG PO TABS: 1.0000 | ORAL_TABLET | ORAL | 0 refills | Status: DC | PRN

## 2019-01-28 MED ORDER — KETOROLAC TROMETHAMINE 30 MG/ML IJ SOLN
30.0000 mg | Freq: Once | INTRAMUSCULAR | Status: AC
  Administered 2019-01-28: 30 mg via INTRAVENOUS
  Filled 2019-01-28: qty 1

## 2019-01-28 MED ORDER — SODIUM CHLORIDE 0.9% FLUSH: 3.0000 mL | Freq: Once | INTRAVENOUS | Status: DC

## 2019-01-28 MED ORDER — SODIUM CHLORIDE 0.9 % IV BOLUS
1000.0000 mL | Freq: Once | INTRAVENOUS | Status: AC
  Administered 2019-01-28: 1000 mL via INTRAVENOUS

## 2019-01-28 MED ORDER — ONDANSETRON HCL 4 MG/2ML IJ SOLN
4.0000 mg | Freq: Once | INTRAMUSCULAR | Status: AC
  Administered 2019-01-28: 4 mg via INTRAVENOUS
  Filled 2019-01-28: qty 2

## 2019-01-28 NOTE — ED Provider Notes (Signed)
[facility] Provider Note   CSN: [id number] Arrival date & time: 01/28/19  1614    History Chief Complaint  Patient presents with   Flank Pain    Melissa Rowe is a 53 y.o. female with past medical history significant for dyspnea with exertion, asthma, obesity, urge incontinence who presents for evaluation of flank pain.  Patient states over the last 3-4 days she has developed flank pain right worse than left.  He does admit to urinary frequency however states she has this at baseline.  Denies fever, chills, nausea, vomiting, chest pain, shortness of breath, abdominal pain, dysuria, hematuria, diarrhea.  Patient states she does have constipation at baseline.  No recent injury or trauma.  No cough, hemoptysis, Covid exposures.  No prior history of kidney stones or pyelonephritis. She does take gabapentin and tramadol at baseline for chronic pain.  She denies any midline back pain, bowel or bladder incontinence, saddle paresthesia or IV drug use.  Pain does not radiate into her extremities, her lower back.  Pain is worse with movement and palpation to flank.  Rates current pain an 8/10.  Denies additional aggravating relieving factors  History obtained from patient and past medical records.  No interpreter is used.   HPI     Past Medical History:  Diagnosis Date   Anemia sept /oct 2014   Arthritis    Asthma    Constipation    Diverticulitis    Dyspnea    with activity   Environmental allergies    Frequency of urination    Gastroenteritis    GERD (gastroesophageal reflux disease)    Glaucoma    Hemorrhoids    external with some bleeding   Hyperlipidemia    Hypothyroidism    Neuropathy    Obesity    OSA (obstructive sleep apnea)    wears CPAP night pt does not know settings   Urgency of urination     Patient Active Problem List   Diagnosis Date Noted   Left carpal tunnel syndrome 09/23/2018   Arm numbness  left 06/16/2018   Excessive daytime sleepiness 12/16/2017   Unilateral primary osteoarthritis, left knee 08/14/2016   Unilateral primary osteoarthritis, right knee 08/14/2016   Chronic pain of both knees 08/14/2016   REM behavioral disorder 08/14/2016   Primary osteoarthritis of both knees 06/14/2016   Allodynia 03/26/2014   Meralgia paraesthetica 02/26/2014   Degeneration of lumbar or lumbosacral intervertebral disc 02/26/2014   Symptomatic mammary hypertrophy 12/16/2013   Acute bronchitis 04/02/2013   Flu-like symptoms 04/02/2013   Blood in stool 02/24/2013   Iron deficiency anemia, unspecified 02/24/2013   Asthma with acute exacerbation 12/28/2012   Post-nasal drainage 09/02/2012   Arthritis of knee, right 03/18/2012   Preoperative respiratory examination 03/14/2012   Preop respiratory exam 02/28/2012   Acute URI 02/12/2012   Vitamin D deficiency 09/21/2010   MORBID OBESITY 10/14/2008   Obstructive sleep apnea 10/14/2008   Moderate persistent asthma 06/02/2008    Past Surgical History:  Procedure Laterality Date   BREAST REDUCTION SURGERY Bilateral 12/16/2013   Procedure: BILATERAL BREAST REDUCTION;  Surgeon: Theodoro Kos, DO;  Location: [place];  Service: Plastics;  Laterality: Bilateral;   BREAST REDUCTION SURGERY Bilateral    CARPAL TUNNEL RELEASE     right   CARPAL TUNNEL RELEASE Right    CARPAL TUNNEL RELEASE Left 10/10/2018   Procedure: LEFT CARPAL TUNNEL RELEASE;  Surgeon: Mcarthur Rossetti, MD;  Location: [facility];  Service: Orthopedics;  Laterality: Left;   CHOLECYSTECTOMY  2008   CHOLECYSTECTOMY     COLONOSCOPY     COLONOSCOPY WITH PROPOFOL N/A 02/24/2013   Procedure: COLONOSCOPY WITH PROPOFOL;  Surgeon: Ladene Artist, MD;  Location: [facility];  Service: Endoscopy;  Laterality: N/A;   KNEE ARTHROSCOPY  03/18/2012   Procedure: ARTHROSCOPY KNEE;  Surgeon: Mcarthur Rossetti, MD;  Location: [place];  Service: Orthopedics;   Laterality: Right;  Right knee arthroscopy with debridement and three compartment chondroplasty   polyps removal  2007   REDUCTION MAMMAPLASTY     TUBAL LIGATION  1992   tubal ligaton       OB History   No obstetric history on file.     Family History  Problem Relation Age of Onset   Heart murmur Mother        rheumatoid arthritis   Heart attack Mother    COPD Mother    Diabetes Mother    Breast cancer Mother    Prostate cancer Father    Kidney failure Father    Asthma Brother        and mother both as a child    Social History   Tobacco Use   Smoking status: Former Smoker    Packs/day: 2.00    Years: 7.00    Pack years: 14.00    Types: Cigarettes    Quit date: 02/12/1990    Years since quitting: 28.9   Smokeless tobacco: Never Used  Substance Use Topics   Alcohol use: Not Currently    Comment: rare   Drug use: No    Home Medications Prior to Admission medications   Medication Sig Start Date End Date Taking? Authorizing Provider  albuterol (PROVENTIL) (2.5 MG/3ML) 0.083% nebulizer solution USE 1 VIAL IN NEBULIZER EVERY 6 HOURS FOR SHORTNESS OF BREATH/WHEEZING AND AS NEEDED Patient taking differently: Take 2.5 mg by nebulization every 6 (six) hours as needed for wheezing or shortness of breath.  08/29/15   Brand Males, MD  albuterol (VENTOLIN HFA) 108 (90 Base) MCG/ACT inhaler USE 2 INHALATIONS BY MOUTH  EVERY 6 HOURS AS NEEDED FOR WHEEZING OR SHORTNESS OF  BREATH 01/27/19   Baird Lyons D, MD  Calcium Carb-Cholecalciferol (CALCIUM 600 + D PO) Take 1 tablet by mouth daily.    [provider]  clonazePAM (KLONOPIN) 1 MG tablet TAKE 1 TABLET BY MOUTH AT  BEDTIME 01/20/19   Sater, Nanine Means, MD  cyclobenzaprine (FLEXERIL) 10 MG tablet Take 1 tablet (10 mg total) by mouth 2 (two) times daily as needed for muscle spasms. 09/22/13   [name], NP  DULERA 200-5 MCG/ACT AERO USE 2 INHALATIONS BY MOUTH  TWICE DAILY 01/27/19   Baird Lyons D, MD  gabapentin (NEURONTIN) 800 MG tablet TAKE 1 TABLET BY MOUTH 4  TIMES DAILY 01/19/19   Sater, Nanine Means, MD  HYDROcodone-acetaminophen (NORCO/VICODIN) 5-325 MG tablet Take 1-2 tablets by mouth every 4 (four) hours as needed. 01/28/19   Janilah Hojnacki A, PA-C  levothyroxine (SYNTHROID) 200 MCG tablet Take 200 mcg by mouth daily before breakfast.    [provider]  lidocaine (LIDODERM) 5 % Place 1 patch onto the skin daily. Remove & Discard patch within 12 hours or as directed by MD 01/28/19   Malcome Ambrocio A, PA-C  modafinil (PROVIGIL) 200 MG tablet TAKE 1 TABLET BY MOUTH  DAILY 01/19/19   Sater, Nanine Means, MD  montelukast (SINGULAIR) 10 MG tablet Take 10 mg by mouth at bedtime.  05/20/12  Brand Males, MD  Multiple Vitamin (MULTIVITAMIN) capsule Take 1 capsule by mouth daily.     [provider]  naproxen sodium (ALEVE) 220 MG tablet Take 440 mg by mouth daily as needed (knee pain).    [provider]  Omeprazole-Sodium Bicarbonate (ZEGERID OTC) 20-1100 MG CAPS capsule Take 1 capsule by mouth daily before breakfast.    [provider]  Oxcarbazepine (TRILEPTAL) 300 MG tablet TAKE 2 TABLETS BY MOUTH TWO TIMES DAILY 12/29/18   Sater, Nanine Means, MD  polyethylene glycol (MIRALAX / GLYCOLAX) packet Take 17 g by mouth daily as needed for moderate constipation.     [provider]  spironolactone (ALDACTONE) 25 MG tablet Take 25 mg by mouth daily.    [provider]  tiZANidine (ZANAFLEX) 2 MG tablet Take 1 tablet (2 mg total) by mouth every 8 (eight) hours as needed for muscle spasms. 01/28/19   Chanya Chrisley A, PA-C   Allergies    Lamictal [lamotrigine] and Other  Review of Systems   Review of Systems  Constitutional: Negative.   HENT: Negative.   Respiratory: Negative.   Cardiovascular: Negative.   Genitourinary: Positive for flank pain, frequency (Chronic) and urgency. Negative for decreased urine volume (Chronic),  difficulty urinating, dysuria, enuresis, genital sores, hematuria, menstrual problem, pelvic pain, vaginal bleeding, vaginal discharge and vaginal pain.  Musculoskeletal: Negative for back pain, gait problem, neck pain and neck stiffness.  Skin: Negative.   Neurological: Negative.   All other systems reviewed and are negative.  Physical Exam Updated Vital Signs BP (!) 153/105 (BP Location: Left Arm)    Pulse 91    Temp 98.7 F (37.1 C) (Oral)    Resp 20    LMP 04/06/2015    SpO2 99%   Physical Exam Vitals and nursing note reviewed.  Constitutional:      General: She is not in acute distress.    Appearance: She is well-developed. She is obese. She is not ill-appearing, toxic-appearing or diaphoretic.  HENT:     Head: Normocephalic and atraumatic.     Nose: Nose normal.     Mouth/Throat:     Mouth: Mucous membranes are moist.     Pharynx: Oropharynx is clear.  Eyes:     Pupils: Pupils are equal, round, and reactive to light.  Cardiovascular:     Rate and Rhythm: Normal rate.     Pulses: Normal pulses.     Heart sounds: Normal heart sounds.     Comments: No murmurs, rubs or gallops Pulmonary:     Effort: Pulmonary effort is normal. No respiratory distress.     Breath sounds: Normal breath sounds.     Comments: Clear to auscultation bilaterally that wheeze, rhonchi or rales Abdominal:     General: Bowel sounds are normal. There is no distension.     Palpations: There is no mass.     Tenderness: There is no abdominal tenderness. There is right CVA tenderness and left CVA tenderness. There is no guarding or rebound.     Hernia: No hernia is present.     Comments: Soft, nontender without rebound or guarding.  No pulsatile abdominal masses.  Positive CVA tenderness to bilateral flank however right greater than left.  Musculoskeletal:        General: Normal range of motion.     Cervical back: Normal range of motion and neck supple.     Comments: Moves all 4 extremities without  difficulty.  Bilateral calves without tenderness, redness or  warmth.  Homans negative  Skin:    General: Skin is warm and dry.     Capillary Refill: Capillary refill takes less than 2 seconds.     Comments: Brisk cap refill. No edema, erythema, warmth. No fluctuance, induration. No vesicles.  Neurological:     General: No focal deficit present.     Mental Status: She is alert.     Motor: No weakness.     Comments: No facial droop. Ambulatory in room without difficulty.    ED Results / Procedures / Treatments   Labs (all labs ordered are listed, but only abnormal results are displayed) Labs Reviewed  COMPREHENSIVE METABOLIC PANEL - Abnormal; Notable for the following components:      Result Value   Potassium 3.4 (*)    Glucose, Bld 104 (*)    All other components within normal limits  CBC - Abnormal; Notable for the following components:   RBC 6.10 (*)    MCV 68.0 (*)    MCH 21.0 (*)    Platelets 409 (*)    All other components within normal limits  URINALYSIS, ROUTINE W REFLEX MICROSCOPIC - Abnormal; Notable for the following components:   APPearance HAZY (*)    Ketones, ur 20 (*)    Protein, ur 30 (*)    All other components within normal limits  URINE CULTURE  LIPASE, BLOOD  I-STAT BETA HCG BLOOD, ED ([facility], [facility], [facility] ONLY)    EKG EKG Interpretation  Date/Time:  Wednesday January 28 2019 18:56:36 EST Ventricular Rate:  91 PR Interval:    QRS Duration: 80 QT Interval:  363 QTC Calculation: 447 R Axis:   36 Text Interpretation: Sinus rhythm Borderline T wave abnormalities Baseline wander Artifact Abnormal ECG Confirmed by Carmin Muskrat [phone]) on 01/28/2019 7:00:17 PM   Radiology CT Abdomen Pelvis W Contrast  Result Date: 01/28/2019 CLINICAL DATA:  Bilateral flank pain x1 week EXAM: CT ABDOMEN AND PELVIS WITH CONTRAST TECHNIQUE: Multidetector CT imaging of the abdomen and pelvis was performed using the standard protocol following bolus administration of intravenous  contrast. CONTRAST:  163mL OMNIPAQUE IOHEXOL 300 MG/ML  SOLN COMPARISON:  None. FINDINGS: Lower chest: Lung bases are clear. Hepatobiliary: Liver is within normal limits. Status post cholecystectomy. No intrahepatic or extrahepatic ductal dilatation. Pancreas: Fatty parenchymal atrophy of the pancreatic head/uncinate process. Spleen: Within normal limits. Adrenals/Urinary Tract: Adrenal glands are within normal limits. Kidneys are within normal limits.  No hydronephrosis. Bladder is within normal limits. Stomach/Bowel: Stomach is within normal limits. No evidence of bowel obstruction. Normal appendix (series 2/image 59). Left colonic diverticulosis, without evidence of diverticulitis. Vascular/Lymphatic: No evidence of abdominal aortic aneurysm. No suspicious abdominopelvic lymphadenopathy. Reproductive: Uterus and bilateral ovaries are within normal limits. Other: No abdominopelvic ascites. Musculoskeletal: Mild degenerative changes at L5-S1. IMPRESSION: Left colonic diverticulosis, without evidence of diverticulitis. Prior cholecystectomy. No CT findings to account for the patient's chronic bilateral flank pain. Electronically Signed   By: Julian Hy M.D.   On: 01/28/2019 20:33    Procedures Procedures (including critical care time)  Medications Ordered in ED Medications  sodium chloride flush (NS) 0.9 % injection 3 mL (has no administration in time range)  sodium chloride 0.9 % bolus 1,000 mL (1,000 mLs Intravenous New Bag/Given 01/28/19 1951)  ondansetron (ZOFRAN) injection 4 mg (4 mg Intravenous Given 01/28/19 1951)  morphine 4 MG/ML injection 4 mg (4 mg Intravenous Given 01/28/19 1957)  ketorolac (TORADOL) 30 MG/ML injection 30 mg (30 mg Intravenous Given 01/28/19 1955)  iohexol (OMNIPAQUE) 300 MG/ML solution 100 mL (100 mLs Intravenous Contrast Given 01/28/19 2008)  HYDROcodone-acetaminophen (NORCO/VICODIN) 5-325 MG per tablet 1 tablet (1 tablet Oral Given 01/28/19 2058)   ED Course  I  have reviewed the triage vital signs and the nursing notes.  Pertinent labs & imaging results that were available during my care of the patient were reviewed by me and considered in my medical decision making (see chart for details).  53 year old presents for evaluation of bilateral flank pain R>L. Afebrile, non septic non ill appearing. No abd pain. No recent injury or trauma. No overlying skin changes. Able to reproduce pain to palpation to bilateral flanks. No red flags for back pain.Tolerating PO intake at home. No CP, SOB, hemoptysis. No DVT on exam. No UR symptoms or concerns for COVID. Labs obtained from triage.  Labs and imaging personally reviewed: CBC without leukocytosis CMP with mild hypokalemia, edition electrolyte, renal or liver abnormality Urinalysis consistent with dehydration Lipase 27 Pregnancy test negative EKG without STEMI CT [facility] without acute findings  2045: Patient reassessed. Pain significant improved with Toradol. Refused Morphine. Continues to deny UR sx, CP, SOB.  Low suspicion for atypical cardiac or pulmonary pathology such as ACS, PE, [place], or carditis, dissection given I able able to reproduce her pain on palpation I have high suspicion for MSK etiology of pain. No evidence of renal stone, pyelonephritis, AAA, dissection on CT scan. Tolerating PO intake without difficulty.  Patient is nontoxic, nonseptic appearing, in no apparent distress.  Patient's pain and other symptoms adequately managed in emergency department.  Fluid bolus given. Patient does not meet the SIRS or Sepsis criteria.  On repeat exam patient does not have a surgical abdomin and there are no peritoneal signs.  No indication of appendicitis, bowel obstruction, bowel perforation, cholecystitis, diverticulitis, PID or ectopic pregnancy.  Patient discharged home with symptomatic treatment and given strict instructions for follow-up with their primary care physician.  I have also discussed reasons to  return immediately to the ER.  Patient expresses understanding and agrees with plan.  The patient has been appropriately medically screened and/or stabilized in the ED. I have low suspicion for any other emergent medical condition which would require further screening, evaluation or treatment in the ED or require inpatient management.  Patient is hemodynamically stable and in no acute distress.  Patient able to ambulate in department prior to ED.  Evaluation does not show acute pathology that would require ongoing or additional emergent interventions while in the emergency department or further inpatient treatment.  I have discussed the diagnosis with the patient and answered all questions.  Pain is been managed while in the emergency department and patient has no further complaints prior to discharge.  Patient is comfortable with plan discussed in room and is stable for discharge at this time.  I have discussed strict return precautions for returning to the emergency department.  Patient was encouraged to follow-up with PCP/specialist refer to at discharge.      MDM Rules/Calculators/A&P                       Final Clinical Impression(s) / ED Diagnoses Final diagnoses:  Flank pain    Rx / DC Orders ED Discharge Orders         Ordered    HYDROcodone-acetaminophen (NORCO/VICODIN) 5-325 MG tablet  Every 4 hours PRN     01/28/19 2057    lidocaine (LIDODERM) 5 %  Every 24 hours  01/28/19 2057    tiZANidine (ZANAFLEX) 2 MG tablet  Every 8 hours PRN     01/28/19 2057           Dotsie Gillette A, PA-C 01/28/19 2101    Carmin Muskrat, MD 01/28/19 2304

## 2019-01-28 NOTE — ED Notes (Signed)
Unsuccessful IV attempt #2.

## 2019-01-28 NOTE — ED Notes (Signed)
The pt does not want the morphine  She has sleep apnea

## 2019-01-28 NOTE — ED Notes (Signed)
One unsuccessful attempt to start an iv 

## 2019-01-28 NOTE — ED Triage Notes (Signed)
Onset 3 days ago bilateral flank pain R>L.  No urinary difficulties.  NO known exposure to COVID or has any symptoms.

## 2019-01-28 NOTE — Discharge Instructions (Addendum)
Take medications as prescribed.  Do not take your home tramadol while taking the Norco prescription.  Have also prescribed you Zanaflex a muscle relaxer.  Follow-up with your primary care doctor in the next 2 days.  If you develop severe worsening pain please seek reevaluation emergency department

## 2019-01-28 NOTE — ED Notes (Signed)
The pt has had flank pain for approx one week bi-laterally  She went to her doctors office and they sent her here and o x 4

## 2019-01-29 LAB — URINE CULTURE

## 2019-02-27 ENCOUNTER — Telehealth: Payer: Self-pay | Admitting: Physician Assistant

## 2019-02-27 NOTE — Telephone Encounter (Signed)
Per last office note, patient was to call in late January to request gel injections.  Bilateral knee gel injections.

## 2019-02-27 NOTE — Telephone Encounter (Signed)
Called and explained to patient I have sent request to be authorized if appointment can be made sooner or later she will be given a call.

## 2019-02-27 NOTE — Telephone Encounter (Signed)
Patient called and stated she was waiting to hear from someone about her gel injection. Pt has an appt sch for 03/18/19 but hasn't heard anything.  Please call patient to advise.[phone]

## 2019-03-03 NOTE — Telephone Encounter (Signed)
Noted  

## 2019-03-04 ENCOUNTER — Telehealth: Payer: Self-pay

## 2019-03-04 NOTE — Telephone Encounter (Signed)
Submitted VOB for SynviscOne, bilateral knee. 

## 2019-03-06 ENCOUNTER — Telehealth: Payer: Self-pay

## 2019-03-06 NOTE — Telephone Encounter (Signed)
Patient is aware that she is approved for gel injection.  Approved for SynviscOne, bilateral knee [place] Patient will be responsible for 20% OOP. May have a co-pay of $30.00 No PA required  Appt. 03/12/2019 with Erskine Emery

## 2019-03-12 ENCOUNTER — Other Ambulatory Visit: Payer: Self-pay

## 2019-03-12 ENCOUNTER — Ambulatory Visit (INDEPENDENT_AMBULATORY_CARE_PROVIDER_SITE_OTHER): Payer: Medicare Other | Admitting: Physician Assistant

## 2019-03-12 ENCOUNTER — Encounter: Payer: Self-pay | Admitting: Physician Assistant

## 2019-03-12 DIAGNOSIS — M1712 Unilateral primary osteoarthritis, left knee: Secondary | ICD-10-CM | POA: Diagnosis not present

## 2019-03-12 DIAGNOSIS — M1711 Unilateral primary osteoarthritis, right knee: Secondary | ICD-10-CM | POA: Diagnosis not present

## 2019-03-12 MED ORDER — LIDOCAINE HCL 1 % IJ SOLN
3.0000 mL | INTRAMUSCULAR | Status: AC | PRN
  Administered 2019-03-12: 15:00:00 3 mL

## 2019-03-12 MED ORDER — HYLAN G-F 20 48 MG/6ML IX SOSY
48.0000 mg | PREFILLED_SYRINGE | INTRA_ARTICULAR | Status: AC | PRN
  Administered 2019-03-12: 48 mg via INTRA_ARTICULAR

## 2019-03-12 NOTE — Progress Notes (Signed)
   Procedure Note  Patient: Melissa Rowe             Date of Birth: 1965-12-16           MRN: [id number]             Visit Date: 03/12/2019  HPI: Ms. Lerma returns today for bilateral Synvisc 1 injections both knees.  She states the cortisone injections both knees 12/01/2018 did not help.  However the left knee is becoming worse.  She has had no new injury to the either knee.  Feels like the left leg gets locked and sometimes gives way.  She is using a Warnke at this point time to ambulate.  Physical exam: Bilateral knees good range of motion.  No tenderness along medial lateral joint line of either knee.  No abnormal warmth erythema or effusion of either knee  Procedures: Visit Diagnoses:  1. Unilateral primary osteoarthritis, left knee   2. Unilateral primary osteoarthritis, right knee     Large Joint Inj: bilateral knee on 03/12/2019 3:18 PM Indications: pain Details: 22 G 1.5 in needle, anterolateral approach  Arthrogram: No  Medications (Right): 3 mL lidocaine 1 %; 48 mg Hylan 48 MG/6ML Medications (Left): 3 mL lidocaine 1 %; 48 mg Hylan 48 MG/6ML Outcome: tolerated well, no immediate complications Procedure, treatment alternatives, risks and benefits explained, specific risks discussed. Consent was given by the patient. Immediately prior to procedure a time out was called to verify the correct patient, procedure, equipment, support staff and site/side marked as required. Patient was prepped and draped in the usual sterile fashion.     Plan: She will follow-up with [place] as needed.  She understands wait least 6 months between supplemental injections and 3 months between cortisone injections.  Questions encouraged and answered.  At next office visit would like an AP and lateral view of both knees and is. It has been sometime since we have last x-ray of the knees.

## 2019-03-18 ENCOUNTER — Ambulatory Visit: Payer: Medicare Other | Admitting: Physician Assistant

## 2019-04-13 ENCOUNTER — Other Ambulatory Visit: Payer: Self-pay | Admitting: Neurology

## 2019-04-14 ENCOUNTER — Telehealth: Payer: Self-pay | Admitting: *Deleted

## 2019-04-14 NOTE — Telephone Encounter (Signed)
Submitted PA modafinil on CMM. Key: [id number] - PA Case [id number]. Waiting on determination from  [employer].

## 2019-04-14 NOTE — Telephone Encounter (Signed)
Request Reference Number: [id number]. MODAFINIL TAB 200MG  is approved through 10/15/2019. Your patient may now fill this prescription and it will be covered.

## 2019-04-16 ENCOUNTER — Ambulatory Visit (INDEPENDENT_AMBULATORY_CARE_PROVIDER_SITE_OTHER): Payer: Medicare Other | Admitting: Internal Medicine

## 2019-04-16 ENCOUNTER — Encounter: Payer: Self-pay | Admitting: Internal Medicine

## 2019-04-16 ENCOUNTER — Other Ambulatory Visit: Payer: Self-pay

## 2019-04-16 VITALS — BP 124/72 | HR 54 | Temp 98.2°F | Ht 66.0 in | Wt 383.3 lb

## 2019-04-16 DIAGNOSIS — J4540 Moderate persistent asthma, uncomplicated: Secondary | ICD-10-CM | POA: Diagnosis not present

## 2019-04-16 DIAGNOSIS — G4733 Obstructive sleep apnea (adult) (pediatric): Secondary | ICD-10-CM | POA: Diagnosis not present

## 2019-04-16 MED ORDER — ALBUTEROL SULFATE HFA 108 (90 BASE) MCG/ACT IN AERS: INHALATION_SPRAY | RESPIRATORY_TRACT | 3 refills | Status: DC

## 2019-04-16 MED ORDER — DULERA 200-5 MCG/ACT IN AERO: INHALATION_SPRAY | RESPIRATORY_TRACT | 3 refills | Status: DC

## 2019-04-16 MED ORDER — ALBUTEROL SULFATE (2.5 MG/3ML) 0.083% IN NEBU: INHALATION_SOLUTION | RESPIRATORY_TRACT | 4 refills | Status: DC

## 2019-04-16 NOTE — Progress Notes (Signed)
HPI Dyspnea due to Obesity and Asthma -> 05/05/2008: Spirometry suggests restriction. CXR 04/19/2008  Normal. CT 02/10/2008  - Neg for PE. No infiltrates -> Methacholine Challenge Test: 06/25/2008: Positive PC20 between 1-4  - 10/11/2009: fev1 2L/70% -> start pulmicort  -11/08/2009: fev1 2.24L/78% and better - > switch to QVAR due to cost - May 2012: fev1 2.32L/85% and normal but symptomactic -> change QVAR to dulera sample,  6day pred burst - Jul 13, 2010: Fev1 2.5L/94%, FVC 3.21/98% and improved -> continue dulera -10/26/2010 FEV1 2.33 L/87%, FVC 2.87 L/88% >cont on dulera  - med calendar 10/26/10  HST 07/08/2018- AHI 9.2/ hr, desaturation to 84%, body weight 371 lbs --------------------------------------------------------------------  06/19/2018- 54 yo F for sleep evaluation. NPSG 08/19/08- AHI 2.9/hr, REM, desaturation to 92%, body weight 328 lbs. She was tried on CPAP but got no relief for c/o EDS. Had been followed then for asthma. Now moved back into this area.  REM Behavior Disorder (Neurology) Dyspnea due to Obesity and Asthma Medical problem list includes  Anemia, Arthritis, Asthma, Degenerative Disk Disease, EDS, GERD, Glaucoma, Hypothyroid, Morbid Obesity,  -----LOV 05/11/2015, sleep study 2010, OSA on CPAP, hasn't gotten new machine in ~10 years, machine is not working properly Body weight today 371 lbs Dulera 200, Singulair, Provigil 200, gabapentin 800 4 x/ d, Clonazepam 1 mg HS,  Neb albuterol Has been fully compliant, trying to keep old machine running. Sleeps better and it prevents loud snoring and witnessed apneas which had been noted before CPAP.  Has f/u appt with Dr Chase Caller in July for asthma.  Denies ENT surgery or heart problems.   04/16/19- 15 yoF former smoker followed for OSA, Asthma, REM Behavior Disorder (Neurology),  Anemia, Arthritis, Asthma, Degenerative Disk Disease, EDS, GERD, Glaucoma, Hypothyroid, Morbid Obesity,  CPAP 5-20/ [employer] Download compliance 100%, AHI 3.5/  hr Body weight today-383 lbs Dulera 200, Singulair, Provigil 200, gabapentin 800 4 x/ d, Clonazepam 1 mg HS,  Neb albuterol Getting joint injections for knee pain. Had flu vax, not covid vax. Used rescue inhaler twice yest. In past week has had sense of mucus clogging upper airway, mainly when lying down. Mucinex some help. Clear mucus. Hadn't tried nebulizer, afraid it "might spread infection down in my lungs".  ROS-see HPI  + = positive Constitutional:    weight loss, night sweats, fevers, chills,+ fatigue, lassitude. HEENT:    headaches, difficulty swallowing, tooth/dental problems, sore throat,       sneezing, itching, ear ache, nasal congestion, post nasal drip, snoring CV:    chest pain, orthopnea, PND, swelling in lower extremities, anasarca,                                  dizziness, palpitations Resp:  + shortness of breath with exertion or at rest.                productive cough,   non-productive cough, coughing up of blood.              change in color of mucus.  wheezing.   Skin:    rash or lesions. GI:  No-   heartburn, indigestion, abdominal pain, nausea, vomiting, diarrhea,                 change in bowel habits, loss of appetite GU: dysuria, change in color of urine, no urgency or frequency.   flank pain. MS:   joint pain, stiffness,  decreased range of motion, back pain. Neuro-     nothing unusual Psych:  change in mood or affect.  depression or anxiety.   memory loss.  OBJ- Physical Exam General- Alert, Oriented, Affect-appropriate, Distress- none acute, + morbidly obese Skin- rash-none, lesions- none, excoriation- none Lymphadenopathy- none Head- atraumatic            Eyes- Gross vision intact, PERRLA, conjunctivae and secretions clear            Ears- Hearing, canals-normal            Nose- Clear, no-Septal dev, mucus, polyps, erosion, perforation             Throat- Mallampati II-III, mucosa clear , drainage- none, tonsils- atrophic, + teeth Neck- flexible ,  trachea midline, no stridor , thyroid nl, carotid no bruit Chest - symmetrical excursion , unlabored           Heart/CV- RRR , no murmur , no gallop  , no rub, nl s1 s2                           - JVD- none , edema- none, stasis changes- none, varices- none           Lung- clear to P&A, wheeze- none, cough- none , dullness-none, rub- none           Chest wall-  Abd-  Br/ Gen/ Rectal- Not done, not indicated Extrem- cyanosis- none, clubbing, none, atrophy- none, strength- nl Neuro- grossly intact to observation

## 2019-04-16 NOTE — Assessment & Plan Note (Signed)
Not sure if the sense of mucus in her throat is significant. Timing suggests possibility of early tree pollen allergy. Plan- CXR, use mucinex regularly, refill inhaled meds

## 2019-04-16 NOTE — Assessment & Plan Note (Signed)
Benefits from CPAP and very compliant with good control and no concerns Plan - continue auto 5-20

## 2019-04-16 NOTE — Patient Instructions (Signed)
Med refills e-sent  Ok to continue CPAP 5-20, mask of choice, humidifier, supplies, airView/ card  Order- CXR   Dx asthma moderate persistent  Ok to use otc Flonase?fluticasone nasal spray  2 puffs each nostril once daily at bedtime  Ok to use an antihistamine like claritin or allegra, etc as needed for drainage, sneezing  Please call if we can help

## 2019-05-21 ENCOUNTER — Other Ambulatory Visit: Payer: Self-pay

## 2019-05-21 ENCOUNTER — Encounter: Payer: Self-pay | Admitting: Physician Assistant

## 2019-05-21 ENCOUNTER — Ambulatory Visit: Payer: Medicare Other | Admitting: Physician Assistant

## 2019-05-21 ENCOUNTER — Other Ambulatory Visit: Payer: Self-pay | Admitting: Internal Medicine

## 2019-05-21 VITALS — Ht 66.0 in | Wt 381.0 lb

## 2019-05-21 DIAGNOSIS — E669 Obesity, unspecified: Secondary | ICD-10-CM | POA: Diagnosis not present

## 2019-05-21 DIAGNOSIS — M1711 Unilateral primary osteoarthritis, right knee: Secondary | ICD-10-CM

## 2019-05-21 DIAGNOSIS — M1712 Unilateral primary osteoarthritis, left knee: Secondary | ICD-10-CM

## 2019-05-21 MED ORDER — METHYLPREDNISOLONE ACETATE 40 MG/ML IJ SUSP
40.0000 mg | INTRAMUSCULAR | Status: AC | PRN
  Administered 2019-05-21: 15:00:00 40 mg via INTRA_ARTICULAR

## 2019-05-21 MED ORDER — METHYLPREDNISOLONE ACETATE 40 MG/ML IJ SUSP
40.0000 mg | INTRAMUSCULAR | Status: AC | PRN
  Administered 2019-05-21: 40 mg via INTRA_ARTICULAR

## 2019-05-21 MED ORDER — LIDOCAINE HCL 1 % IJ SOLN
3.0000 mL | INTRAMUSCULAR | Status: AC | PRN
  Administered 2019-05-21: 15:00:00 3 mL

## 2019-05-21 MED ORDER — LIDOCAINE HCL 1 % IJ SOLN
3.0000 mL | INTRAMUSCULAR | Status: AC | PRN
  Administered 2019-05-21: 3 mL

## 2019-05-21 NOTE — Progress Notes (Signed)
   Procedure Note  Patient: Melissa Rowe             Date of Birth: 1965/12/21           MRN: [id number]             Visit Date: 05/21/2019  HPI: Melissa Rowe is well-known.by the service comes in today requesting bilateral knee injections.  She is 10 weeks status post both knees pain injections.  She has known osteoarthritis of both knees.  States the injections helped but she is starting to have some achiness in her knees.  She is planning to start water aerobics again soon.  She is ambulating with a cane.  Height 5 foot 6 weight 381 BMI 61.5 Physical exam: Bilateral knees no abnormal warmth erythema or effusion.  Good range of motion of both knees.  Difficulty getting up from a sitting position.  Using no assistive device to ambulate today.  Procedures: Visit Diagnoses:  1. Unilateral primary osteoarthritis, left knee   2. Unilateral primary osteoarthritis, right knee     Large Joint Inj: bilateral knee on 05/21/2019 3:07 PM Indications: pain Details: 22 G 1.5 in needle, anterolateral approach  Arthrogram: No  Medications (Right): 3 mL lidocaine 1 %; 40 mg methylPREDNISolone acetate 40 MG/ML Medications (Left): 3 mL lidocaine 1 %; 40 mg methylPREDNISolone acetate 40 MG/ML Outcome: tolerated well, no immediate complications Procedure, treatment alternatives, risks and benefits explained, specific risks discussed. Consent was given by the patient. Immediately prior to procedure a time out was called to verify the correct patient, procedure, equipment, support staff and site/side marked as required. Patient was prepped and draped in the usual sterile fashion.    Plan: Patient states she would like to try to lose weight and see [place] back 3 months to see if she be a candidate for knee surgery.  I did discuss with her that her BMI would need to be under under 40.  She will need to lose down to approximately 245 pounds to meet this goal.  Questions were encouraged and answered.

## 2019-05-27 ENCOUNTER — Other Ambulatory Visit: Payer: Self-pay | Admitting: Internal Medicine

## 2019-05-27 MED ORDER — ALBUTEROL SULFATE HFA 108 (90 BASE) MCG/ACT IN AERS: INHALATION_SPRAY | RESPIRATORY_TRACT | 4 refills | Status: DC

## 2019-06-13 ENCOUNTER — Other Ambulatory Visit: Payer: Self-pay | Admitting: Neurology

## 2019-08-12 ENCOUNTER — Telehealth: Payer: Self-pay | Admitting: Neurology

## 2019-08-12 NOTE — Telephone Encounter (Signed)
Pt called and LVM stating that she is needing her clonazePAM (KLONOPIN) 1 MG tablet refill faxed over to her pharmacy [facility].

## 2019-09-03 ENCOUNTER — Ambulatory Visit: Payer: Medicare Other | Admitting: Physician Assistant

## 2019-09-10 ENCOUNTER — Encounter: Payer: Self-pay | Admitting: Adult Health

## 2019-09-10 ENCOUNTER — Ambulatory Visit: Payer: Medicare Other | Admitting: Adult Health

## 2019-09-10 VITALS — BP 125/77 | HR 95 | Ht 66.0 in | Wt 386.0 lb

## 2019-09-10 DIAGNOSIS — G5602 Carpal tunnel syndrome, left upper limb: Secondary | ICD-10-CM

## 2019-09-10 DIAGNOSIS — G4719 Other hypersomnia: Secondary | ICD-10-CM | POA: Diagnosis not present

## 2019-09-10 DIAGNOSIS — G4752 REM sleep behavior disorder: Secondary | ICD-10-CM | POA: Diagnosis not present

## 2019-09-10 DIAGNOSIS — G5711 Meralgia paresthetica, right lower limb: Secondary | ICD-10-CM | POA: Diagnosis not present

## 2019-09-10 MED ORDER — CLONAZEPAM 1 MG PO TABS: 1.0000 mg | ORAL_TABLET | Freq: Every day | ORAL | 1 refills | Status: DC

## 2019-09-10 MED ORDER — GABAPENTIN 800 MG PO TABS: ORAL_TABLET | ORAL | 3 refills | Status: DC

## 2019-09-10 MED ORDER — MODAFINIL 200 MG PO TABS: 200.0000 mg | ORAL_TABLET | Freq: Every day | ORAL | 1 refills | Status: DC

## 2019-09-10 MED ORDER — OXCARBAZEPINE 300 MG PO TABS: 600.0000 mg | ORAL_TABLET | Freq: Two times a day (BID) | ORAL | 1 refills | Status: DC

## 2019-09-10 NOTE — Patient Instructions (Addendum)
Your Plan:  Continue current medications  Refills provided   Follow-up in 6 months with Dr. Felecia Shelling or call earlier if needed     Thank you for coming to see [place] at [facility]. I hope we have been able to provide you high quality care today.  You may receive a patient satisfaction survey over the next few weeks. We would appreciate your feedback and comments so that we may continue to improve ourselves and the health of our patients.

## 2019-09-10 NOTE — Progress Notes (Signed)
[facility]  PATIENT: Melissa Rowe DOB: June 01, 1965   REFERRING CLINICIAN: Dr. [name] HISTORY FROM: Patient  REASON FOR VISIT: Lateral thigh pain   HISTORICAL  CHIEF COMPLAINT:  Chief Complaint  Patient presents with  . Follow-up    rm 5 here for a f/u on left arm numbness. Pt is having no new sx.    HISTORY OF PRESENT ILLNESS:  Melissa Rowe is a 54 y.o. woman with right anterolateral thigh numbness and pain due to a Lateral femoral cutaneous neuropathy and a sleep disorder.     EMG/NCV 09/23/2018: Moderately severe left median neuropathy at the wrist (carpal tunnel syndrome)  Underwent left carpal tunnel syndrome release on 10/10/2018 by Dr. Ninfa Linden with improvement of left arm symptoms  LFCN dysesthetic pain: Continues on oxcarbazepine and gabapentin. Tolerating well with ongoing benefit  REM behavioral disorder: Placed on clonazepam with benefit of active dreams but due to missing prior scheduled visit, she was unable to refill clonazepam until today and has been out of it for the past couple weeks and has noticed worsening.  EDS: Continues on modafinil with benefit.  Decreased daytime napping and fatigue.  Reports continued use of CPAP for OSA management.       History copied from Dr. Garth Bigness prior office notes Update 06/16/2018: She is reporting more pain in the left arm with a tingling numbness like its a asleep and sometimes like a burning.   It goes into the entire hand.   Normally, if she gets hand numbness she would shake it and it would do better for a while.    Now the numbness stays.   This used to be intermittent x a couple years but progressed over the last year and is now near constant.    The right arm is fine.   The LFCN dysesthetic pain is mild with the medications and numbing ointment.       She is now sleeping well and is no longer having active dreams since starting clonazepam.   She still sometimes talks in her sleep.   She has  OSA and uses CPAP.   Her EDS is better with modafinil.    She now weights 362 (was 369 at last visit).    Update 12/16/2017:   Her LFCN pain is doing fairly well.   The lidocaine ointment helped along with the gabapentin and oxcarbazepine.       She falls asleep easily on her medications.   She has OSA and is on CPAP.    Clonazepam helps her sleep and she has no hangover in the morning.  Her excessive daytime sleepiness is better with modafinil.     She sometimes takes a nap with CPAP (and sometimes if she dozes off while watching TV, without CPAP).     She always feels better after a nap.    Her active dreams are much better with clonazepam but she still sometimes talks in her sleep.    She used to yell out but now just has talking.      Update 06/14/2017: LFCN:   The right leg pain improved with medication and improved further with an RFA or similar procedure by [facility].   The LFCN anterior region is numb and pain free abut the more lateral distribution has dysesthesia and pain.   She tolerates the medications  Sleep issues:    She is having active dreams, helped by clonazepam.    She talks in her dreams a  lot and other times seems to be running in bed, arguing and punching.   She dozes off a lot and takes naps most days.   She has OSA on CPAP ([facility]).    She had a few episodes of sleep paralysis but none in last year.   No cataplexy.      From 08/14/2016: The lateral femoral cutaneous nerve pain improved on oxcarbazepine 600 mg by mouth twice a day with gabapentin 4 times a day. However, there was still a fair amount of residual pain due to the dysesthesia and allodynia.   She tolerates oxcarbazepine and gabapentin well at her current doses. The past she had more difficulty tolerating Lyrica (severe weight gain and strange dreams) and lamotrigine (felt dizzy).    She had a couple of injections at [facility] and has done better.    She is going to have another LFCN procedure at  [facility].     Much of the distribution of the LFCN is now numb but there is still a painful patch (most posterolateral).    The lidocaine ointment helps this pain some.     She has had nightmare dreams the past 2 several months.   She screams things like 'Run Run'.   Dreams are usually between 2-4 am.  She moves her legs some but has not punched or kicked or gotten out of bed.  This is now happening most nights.       She denies any muscle rigidity, gait changes.  She has had a couple episodes of sleep paralysis this year.   Maybe one episode of hypnagogic hallucination.    She had never had cataplexy   She has OSA and is on CPAP.   She is often sleepy.      LFCN History:   She had breast reduction surgery on November 4th and she reports that the right thigh was 'going to sleep' with numbness and tingling later that night.    Over the next week it got more painful and by December, the pain was very severe.  Pain was apparently so bad that she was screaming out in the middle of the night. She describes a gnawing pain that is constant but also has a stabbing pain frequently.   She was worked in at her Primary care provider's office, Dr. [name], and was diagnosed with right meralgia paresthetica.   Lyrica was started.  She saw neurosurgery and the Lyrica dose was increased from 75  to 150 mg twice a day with only minimal relief. She returned to her primary care provider's office on 02/16/2014 and tramadol was added.    Of note, she gained about 40 pounds during the 4 weeks between her two primary care visits.   On the higher dose of Lyrica, the stabbing pain was helped but the more annoying constant pain was not. She was switched from Lyrica to gabapentin with benefit (02/2014).      Lamotrigine was not tolerated (added 03/2014).   Lidocaine ointment helps some (added 2/20165).    Oxcabazepine added 09/2014 with improvement, dose slwowly titrated to 600 mg po bid.  around May 2017 she had a procedure at [facility] with improvement of the anterior half of the painful region but not the lateral half.[name]        REVIEW OF SYSTEMS:  Constitutional: No fevers, chills, sweats, or change in appetite Eyes: No visual changes, double vision, eye pain Ear, nose and throat: No hearing  loss, ear pain, nasal congestion, sore throat Cardiovascular: No chest pain, palpitations Respiratory:  No shortness of breath at rest or with exertion.   No wheezes.    She has OSA and is on CPAP. GastrointestinaI: No nausea, vomiting, diarrhea, abdominal pain, fecal incontinence Genitourinary:  No dysuria, urinary retention or frequency.  No nocturia. Musculoskeletal:  No neck pain, some back pain, moderate knee pain   Integumentary: No rash, pruritus, skin lesions Neurological: as above  ALLERGIES: Allergies  Allergen Reactions  . Lamictal [Lamotrigine] Itching    Migraine   . Other Rash    Powder inside latex gloves - rash and sores     HOME MEDICATIONS: Outpatient Medications Prior to Visit  Medication Sig Dispense Refill  . albuterol (PROAIR HFA) 108 (90 Base) MCG/ACT inhaler Inhale 2 puffs every 6 hours as needed- rescue 54 g 4  . albuterol (PROVENTIL) (2.5 MG/3ML) 0.083% nebulizer solution USE 1 VIAL VIA NEBULIZER  EVERY 6 HOURS FOR SHORTNESS OF BREATH/WHEEZING AND AS  NEEDED. MANUFACTURER  RECOMMENDS MAX 4 VIALS/DAY 900 mL 1  . Calcium Carb-Cholecalciferol (CALCIUM 600 + D PO) Take 1 tablet by mouth daily.    . cyclobenzaprine (FLEXERIL) 10 MG tablet Take 1 tablet (10 mg total) by mouth 2 (two) times daily as needed for muscle spasms. 20 tablet 0  . levothyroxine (SYNTHROID) 200 MCG tablet Take 200 mcg by mouth daily before breakfast.    . lidocaine (LIDODERM) 5 % Place 1 patch onto the skin daily. Remove & Discard patch within 12 hours or as directed by MD 30 patch 0  . mometasone-formoterol (DULERA) 200-5 MCG/ACT AERO USE 2 INHALATIONS BY MOUTH  TWICE DAILY 39 g 3  . montelukast (SINGULAIR) 10 MG  tablet Take 10 mg by mouth at bedtime.     . Multiple Vitamin (MULTIVITAMIN) capsule Take 1 capsule by mouth daily.     Earney Navy Bicarbonate (ZEGERID OTC) 20-1100 MG CAPS capsule Take 1 capsule by mouth daily before breakfast.    . polyethylene glycol (MIRALAX / GLYCOLAX) packet Take 17 g by mouth daily as needed for moderate constipation.     [name] spironolactone (ALDACTONE) 25 MG tablet Take 25 mg by mouth daily.    . traMADol (ULTRAM) 50 MG tablet Take 50 mg by mouth 3 (three) times daily as needed.    . clonazePAM (KLONOPIN) 1 MG tablet TAKE 1 TABLET BY MOUTH AT  BEDTIME 90 tablet 1  . gabapentin (NEURONTIN) 800 MG tablet TAKE 1 TABLET BY MOUTH 4  TIMES DAILY 360 tablet 3  . modafinil (PROVIGIL) 200 MG tablet TAKE 1 TABLET BY MOUTH  DAILY 90 tablet 1  . Oxcarbazepine (TRILEPTAL) 300 MG tablet TAKE 2 TABLETS BY MOUTH TWO TIMES DAILY 360 tablet 3  . naproxen sodium (ALEVE) 220 MG tablet Take 440 mg by mouth daily as needed (knee pain).    [name] tiZANidine (ZANAFLEX) 2 MG tablet Take 1 tablet (2 mg total) by mouth every 8 (eight) hours as needed for muscle spasms. 10 tablet 0   No facility-administered medications prior to visit.    PAST MEDICAL HISTORY: Past Medical History:  Diagnosis Date  . Anemia sept /oct 2014  . Arthritis   . Asthma   . Constipation   . Diverticulitis   . Dyspnea    with activity  . Environmental allergies   . Frequency of urination   . Gastroenteritis   . GERD (gastroesophageal reflux disease)   . Glaucoma   . Hemorrhoids  external with some bleeding  . Hyperlipidemia   . Hypothyroidism   . Neuropathy   . Obesity   . OSA (obstructive sleep apnea)    wears CPAP night pt does not know settings  . Urgency of urination     PAST SURGICAL HISTORY: Past Surgical History:  Procedure Laterality Date  . BREAST REDUCTION SURGERY Bilateral 12/16/2013   Procedure: BILATERAL BREAST REDUCTION;  Surgeon: Theodoro Kos, DO;  Location: [facility];  Service:  Plastics;  Laterality: Bilateral;  . BREAST REDUCTION SURGERY Bilateral   . CARPAL TUNNEL RELEASE     right  . CARPAL TUNNEL RELEASE Right   . CARPAL TUNNEL RELEASE Left 10/10/2018   Procedure: LEFT CARPAL TUNNEL RELEASE;  Surgeon: Mcarthur Rossetti, MD;  Location: [facility];  Service: Orthopedics;  Laterality: Left;  . CHOLECYSTECTOMY  2008  . CHOLECYSTECTOMY    . COLONOSCOPY    . COLONOSCOPY WITH PROPOFOL N/A 02/24/2013   Procedure: COLONOSCOPY WITH PROPOFOL;  Surgeon: Ladene Artist, MD;  Location: [facility];  Service: Endoscopy;  Laterality: N/A;  . KNEE ARTHROSCOPY  03/18/2012   Procedure: ARTHROSCOPY KNEE;  Surgeon: Mcarthur Rossetti, MD;  Location: [facility];  Service: Orthopedics;  Laterality: Right;  Right knee arthroscopy with debridement and three compartment chondroplasty  . polyps removal  2007  . REDUCTION MAMMAPLASTY    . TUBAL LIGATION  1992  . tubal ligaton      FAMILY HISTORY: Family History  Problem Relation Age of Onset  . Heart murmur Mother        rheumatoid arthritis  . Heart attack Mother   . COPD Mother   . Diabetes Mother   . Breast cancer Mother   . Prostate cancer Father   . Kidney failure Father   . Asthma Brother        and mother both as a child    SOCIAL HISTORY:  Social History   Socioeconomic History  . Marital status: Married    Spouse name: Not on file  . Number of children: 2  . Years of education: Not on file  . Highest education level: Not on file  Occupational History  . 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 Smoking status: Former Smoker    Packs/day: 2.00    Years: 7.00    Pack years: 14.00    Types: Cigarettes    Quit date: 02/12/1990    Years since quitting: 29.5  . Smokeless tobacco: Never Used  Vaping Use  . Vaping Use: Never used  Substance and Sexual Activity  . Alcohol use: Not Currently    Comment: rare  . Drug use: No  . Sexual  activity: Not Currently  Other Topics Concern  . Not on file  Social History Narrative  . Not on file   Social Determinants of Health   Financial Resource Strain:   . Difficulty of Paying Living Expenses:   Food Insecurity:   . Worried About Charity fundraiser in the Last Year:   . Arboriculturist in the Last Year:   Transportation Needs:   . Film/video editor (Medical):   [name] Lack of Transportation (Non-Medical):   Physical Activity:   . Days of Exercise per Week:   . Minutes of Exercise per Session:   Stress:   . Feeling of Stress :   Social Connections:   . Frequency of Communication  with Friends and Family:   . Frequency of Social Gatherings with Friends and Family:   . Attends Religious Services:   . Active Member of Clubs or Organizations:   . Attends Archivist Meetings:   [name] Marital Status:   Intimate Partner Violence:   . Fear of Current or Ex-Partner:   . Emotionally Abused:   [name] Physically Abused:   . Sexually Abused:      PHYSICAL EXAM  Vitals:   09/10/19 0844  BP: 125/77  Pulse: 95  Weight: (!) 386 lb (175.1 kg)  Height: 5\' 6"  (1.676 m)    Body mass index is 62.3 kg/m.   General: The patient Is an obese very pleasant woman in no acute distress.   Neurologic Exam  Mental status: The patient is alert and oriented x 3 at the time of the examination. The patient has apparent normal recent and remote memory, with an apparently normal attention span and concentration ability.   Speech is normal.  Cranial nerves: Extraocular movements are full.  Facial strength is normal.  Trapezius and sternocleidomastoid strength is normal. No dysarthria is noted.     Motor:  Muscle bulk and tone are normal. Strength is  5 / 5 in all 4 extremities.   Sensory:    She has reduced sensation to touch in the anterolateral right thigh.  Anteriorly, she is numb without allodynia or hyperpathia.  More laterally in the LFCN distribution she has reduced light  touch sensation while at the same time has allodynia and hyperpathia she has normal sensation to touch elsewhere..  Gait and station: Station is normal but the gait is arthritic with a reduced stride and use of cane.  (She has significant knee arthritis)   Reflexes: Deep tendon reflexes are symmetric and normal bilaterally.     DIAGNOSTIC DATA (LABS, IMAGING, TESTING) - I reviewed patient records, labs, notes, testing and imaging myself where available.  Lab Results  Component Value Date   WBC 7.3 01/28/2019   HGB 12.8 01/28/2019   HCT 41.5 01/28/2019   MCV 68.0 (L) 01/28/2019   PLT 409 (H) 01/28/2019      Component Value Date/Time   NA 140 01/28/2019 1628   K 3.4 (L) 01/28/2019 1628   CL 105 01/28/2019 1628   CO2 22 01/28/2019 1628   GLUCOSE 104 (H) 01/28/2019 1628   BUN 6 01/28/2019 1628   CREATININE 0.65 01/28/2019 1628   CALCIUM 9.8 01/28/2019 1628   PROT 8.0 01/28/2019 1628   ALBUMIN 4.1 01/28/2019 1628   AST 24 01/28/2019 1628   ALT 26 01/28/2019 1628   ALKPHOS 76 01/28/2019 1628   BILITOT 0.4 01/28/2019 1628   GFRNONAA >60 01/28/2019 1628   GFRAA >60 01/28/2019 1628   Lab Results  Component Value Date   CHOL 209 (H) 09/22/2009   HDL 62 09/22/2009   LDLCALC 116 (H) 09/22/2009   TRIG 156 (H) 09/22/2009   CHOLHDL 3.4 Ratio 09/22/2009   Lab Results  Component Value Date   HGBA1C 5.7 (H) 12/16/2013   No results found for: VITAMINB12 Lab Results  Component Value Date   TSH 6.759 (H) 01/11/2009        ASSESSMENT AND PLAN  Meralgia paresthetica of right side  REM behavioral disorder  Excessive daytime sleepiness  Left carpal tunnel syndrome   1.   Continue the oxcarbazepine and gabapentin for the lateral femoral cutaneous neuropathy.  Refills provided.  Continues to experience benefit with ongoing use. 2.  Continue clonazepam for the REM behavior disorder and modafinil for excessive daytime sleepiness.  Refills provided.  Reports ongoing  management 3.   Left carpal tunnel syndrome s/p release.  Reports great improvement of left arm symptoms and continues to follow with Dr. Ninfa Linden   Follow-up in 6 months with Dr. Felecia Shelling or call earlier if needed   I spent 25 minutes of face-to-face and non-face-to-face time with patient.  This included previsit chart review, lab review, study review, order entry, electronic health record documentation, patient education regarding LFCN, REM behavior disorder and excessive daytime sleepiness with ongoing use of medications as well as discussed left carpal tunnel syndrome with release procedure and answered all questions to patient satisfaction  Frann Rider, AGNP-BC  [facility] [address] [place], [place] [address]  Phone [phone] Fax [phone] Note: This document was prepared with digital dictation and possible smart phrase technology. Any transcriptional errors that result from this process are unintentional.

## 2019-09-10 NOTE — Progress Notes (Signed)
I have read the note, and I agree with the clinical assessment and plan.  Caliyah Sieh A. Anabia Weatherwax, MD, PhD, FAAN Certified in Neurology, Clinical Neurophysiology, Sleep Medicine, Pain Medicine and Neuroimaging  [facility] [address], [address] [place], [place] [address] [phone]  

## 2019-09-14 ENCOUNTER — Telehealth: Payer: Self-pay | Admitting: Radiology

## 2019-09-14 ENCOUNTER — Ambulatory Visit (INDEPENDENT_AMBULATORY_CARE_PROVIDER_SITE_OTHER): Payer: Medicare Other | Admitting: Physician Assistant

## 2019-09-14 ENCOUNTER — Ambulatory Visit: Payer: Self-pay

## 2019-09-14 ENCOUNTER — Encounter: Payer: Self-pay | Admitting: Physician Assistant

## 2019-09-14 DIAGNOSIS — M1712 Unilateral primary osteoarthritis, left knee: Secondary | ICD-10-CM

## 2019-09-14 DIAGNOSIS — M1711 Unilateral primary osteoarthritis, right knee: Secondary | ICD-10-CM

## 2019-09-14 MED ORDER — METHYLPREDNISOLONE ACETATE 40 MG/ML IJ SUSP
40.0000 mg | INTRAMUSCULAR | Status: AC | PRN
  Administered 2019-09-14: 40 mg via INTRA_ARTICULAR

## 2019-09-14 MED ORDER — LIDOCAINE HCL 1 % IJ SOLN
0.5000 mL | INTRAMUSCULAR | Status: AC | PRN
  Administered 2019-09-14: .5 mL

## 2019-09-14 MED ORDER — LIDOCAINE HCL 1 % IJ SOLN
0.5000 mL | INTRAMUSCULAR | Status: AC | PRN
Start: 2019-09-14 — End: 2019-09-14
  Administered 2019-09-14: .5 mL

## 2019-09-14 NOTE — Telephone Encounter (Signed)
Submitted for VOB for Synvisc One- Bilateral knee  

## 2019-09-14 NOTE — Telephone Encounter (Signed)
Bilateral knee supplemental injections  Patient aware she will be called to schedule appointment after approval.

## 2019-09-14 NOTE — Progress Notes (Signed)
Office Visit Note   Patient: Melissa Rowe           Date of Birth: May 08, 1965           MRN: [id number] Visit Date: 09/14/2019              Requested by: Marton Redwood, MD [address] [place],  [place] [address] PCP: Marton Redwood, MD   Assessment & Plan: Visit Diagnoses:  1. Unilateral primary osteoarthritis, left knee   2. Unilateral primary osteoarthritis, right knee     Plan: Patient tolerated cortisone injection both knees today.  She wants to undergo supplemental injections in the knees after lasted the past for longer period of time than the cortisone injections.  She will follow up with [place] once these injections are available.  Questions were encouraged and answered at length.  She continues to try to work on weight loss and quad strengthening both knees.  Follow-Up Instructions: Return for Supplemental injection.   Orders:  Orders Placed This Encounter  Procedures  . Large Joint Inj  . XR Knee 1-2 Views Right  . XR Knee 1-2 Views Left   No orders of the defined types were placed in this encounter.     Procedures: Large Joint Inj: bilateral knee on 09/14/2019 10:08 AM Indications: pain Details: 22 G 1.5 in needle, anterolateral approach  Arthrogram: No  Medications (Right): 0.5 mL lidocaine 1 %; 40 mg methylPREDNISolone acetate 40 MG/ML Medications (Left): 0.5 mL lidocaine 1 %; 40 mg methylPREDNISolone acetate 40 MG/ML Outcome: tolerated well, no immediate complications Procedure, treatment alternatives, risks and benefits explained, specific risks discussed. Consent was given by the patient. Immediately prior to procedure a time out was called to verify the correct patient, procedure, equipment, support staff and site/side marked as required. Patient was prepped and draped in the usual sterile fashion.       Clinical Data: No additional findings.   Subjective: Chief Complaint  Patient presents with  . Right Knee - Pain  . Left Knee - Pain     HPI Mrs. Courtois returns today for bilateral knee pain.  She is ambulating with a rolling Speigner.  She states left knee is bothering her more than right.  She has had no known injury to either knee.  She did check out the aquatic center but is not been as the classes were not large enough for water aerobics. Review of Systems Negative for fevers chills shortness of breath chest pain  Objective: Vital Signs: LMP 04/06/2015   Physical Exam General: Well-developed well-nourished female no acute distress ambulates with a rolling [place] with an antalgic gait. Ortho Exam Bilateral knees good range of motion of both knees.  Tenderness along medial joint line of both knees.  No abnormal warmth erythema of either knee.  No instability valgus varus stressing of. Specialty Comments:  No specialty comments available.  Imaging: XR Knee 1-2 Views Left  Result Date: 09/14/2019 Left knee tricompartmental changes with severe medial and patellofemoral changes.  Varus deformity.  No acute fractures or bony abnormalities otherwise.  XR Knee 1-2 Views Right  Result Date: 09/14/2019 Right knee AP and lateral views: No acute fractures.  Tricompartmental changes with bone-on-bone medial compartment and severe patellofemoral changes.  Varus deformity present.    PMFS History: Patient Active Problem List   Diagnosis Date Noted  . Left carpal tunnel syndrome 09/23/2018  . Arm numbness left 06/16/2018  . Excessive daytime sleepiness 12/16/2017  . Unilateral primary osteoarthritis, left knee  08/14/2016  . Unilateral primary osteoarthritis, right knee 08/14/2016  . Chronic pain of both knees 08/14/2016  . REM behavioral disorder 08/14/2016  . Primary osteoarthritis of both knees 06/14/2016  . Allodynia 03/26/2014  . Meralgia paraesthetica 02/26/2014  . Degeneration of lumbar or lumbosacral intervertebral disc 02/26/2014  . Symptomatic mammary hypertrophy 12/16/2013  . Acute bronchitis 04/02/2013  .  Flu-like symptoms 04/02/2013  . Blood in stool 02/24/2013  . Iron deficiency anemia, unspecified 02/24/2013  . Asthma with acute exacerbation 12/28/2012  . Post-nasal drainage 09/02/2012  . Arthritis of knee, right 03/18/2012  . Preoperative respiratory examination 03/14/2012  . Preop respiratory exam 02/28/2012  . Acute URI 02/12/2012  . Vitamin D deficiency 09/21/2010  . MORBID OBESITY 10/14/2008  . Obstructive sleep apnea 10/14/2008  . Moderate persistent asthma 06/02/2008   Past Medical History:  Diagnosis Date  . Anemia sept /oct 2014  . Arthritis   . Asthma   . Constipation   . Diverticulitis   . Dyspnea    with activity  . Environmental allergies   . Frequency of urination   . Gastroenteritis   . GERD (gastroesophageal reflux disease)   . Glaucoma   . Hemorrhoids    external with some bleeding  . Hyperlipidemia   . Hypothyroidism   . Neuropathy   . Obesity   . OSA (obstructive sleep apnea)    wears CPAP night pt does not know settings  . Urgency of urination     Family History  Problem Relation Age of Onset  . Heart murmur Mother        rheumatoid arthritis  . Heart attack Mother   . COPD Mother   . Diabetes Mother   . Breast cancer Mother   . Prostate cancer Father   . Kidney failure Father   . Asthma Brother        and mother both as a child    Past Surgical History:  Procedure Laterality Date  . BREAST REDUCTION SURGERY Bilateral 12/16/2013   Procedure: BILATERAL BREAST REDUCTION;  Surgeon: Theodoro Kos, DO;  Location: [facility];  Service: Plastics;  Laterality: Bilateral;  . BREAST REDUCTION SURGERY Bilateral   . CARPAL TUNNEL RELEASE     right  . CARPAL TUNNEL RELEASE Right   . CARPAL TUNNEL RELEASE Left 10/10/2018   Procedure: LEFT CARPAL TUNNEL RELEASE;  Surgeon: Mcarthur Rossetti, MD;  Location: [facility];  Service: Orthopedics;  Laterality: Left;  . CHOLECYSTECTOMY  2008  . CHOLECYSTECTOMY    . COLONOSCOPY    . COLONOSCOPY WITH PROPOFOL  N/A 02/24/2013   Procedure: COLONOSCOPY WITH PROPOFOL;  Surgeon: Ladene Artist, MD;  [facility];  Service: Endoscopy;  Laterality: N/A;  . KNEE ARTHROSCOPY  03/18/2012   Procedure: ARTHROSCOPY KNEE;  Surgeon: Mcarthur Rossetti, MD;  Location: [facility];  Service: Orthopedics;  Laterality: Right;  Right knee arthroscopy with debridement and three compartment chondroplasty  . polyps removal  2007  . REDUCTION MAMMAPLASTY    . TUBAL LIGATION  1992  . tubal ligaton     Social History   Occupational History  . 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 Smoking status: Former Smoker    Packs/day: 2.00    Years: 7.00    Pack years: 14.00    Types: Cigarettes    Quit date: 02/12/1990    Years since quitting: 29.6  . Smokeless tobacco:  Never Used  Vaping Use  . Vaping Use: Never used  Substance and Sexual Activity  . Alcohol use: Not Currently    Comment: rare  . Drug use: No  . Sexual activity: Not Currently

## 2019-09-15 ENCOUNTER — Telehealth: Payer: Self-pay

## 2019-09-15 NOTE — Telephone Encounter (Signed)
Approved for Synvisc One-Bilateral knee Dr. Margarito Liner and Bill $30 copay 20% OOP No prior auth required

## 2019-09-15 NOTE — Telephone Encounter (Signed)
Called and scheduled pt. Pt is aware of copay °

## 2019-09-23 ENCOUNTER — Ambulatory Visit (INDEPENDENT_AMBULATORY_CARE_PROVIDER_SITE_OTHER): Payer: Medicare Other | Admitting: Physician Assistant

## 2019-09-23 DIAGNOSIS — M1711 Unilateral primary osteoarthritis, right knee: Secondary | ICD-10-CM | POA: Diagnosis not present

## 2019-09-23 DIAGNOSIS — M1712 Unilateral primary osteoarthritis, left knee: Secondary | ICD-10-CM

## 2019-09-23 MED ORDER — HYLAN G-F 20 48 MG/6ML IX SOSY
48.0000 mg | PREFILLED_SYRINGE | INTRA_ARTICULAR | Status: AC | PRN
  Administered 2019-09-23: 48 mg via INTRA_ARTICULAR

## 2019-09-23 MED ORDER — LIDOCAINE HCL 1 % IJ SOLN
0.5000 mL | INTRAMUSCULAR | Status: AC | PRN
  Administered 2019-09-23: .5 mL

## 2019-09-23 NOTE — Progress Notes (Signed)
   Procedure Note  Patient: Melissa Rowe             Date of Birth: 30-Mar-1965           MRN: [id number]             Visit Date: 09/23/2019 HPI: Melissa Rowe comes in today for bilateral knee Synvisc 1 injections.  She has had no new injury to either knee.  She has known arthritis of both knees.  Physical exam: Bilateral knees no abnormal warmth erythema or effusion.  Good range of motion both knees. Procedures: Visit Diagnoses:  1. Unilateral primary osteoarthritis, left knee   2. Unilateral primary osteoarthritis, right knee     Large Joint Inj: bilateral knee on 09/23/2019 3:23 PM Indications: pain Details: 22 G 1.5 in needle, anterolateral approach  Arthrogram: No  Medications (Right): 0.5 mL lidocaine 1 %; 48 mg Hylan 48 MG/6ML Medications (Left): 0.5 mL lidocaine 1 %; 48 mg Hylan 48 MG/6ML Outcome: tolerated well, no immediate complications Procedure, treatment alternatives, risks and benefits explained, specific risks discussed. Consent was given by the patient. Immediately prior to procedure a time out was called to verify the correct patient, procedure, equipment, support staff and site/side marked as required. Patient was prepped and draped in the usual sterile fashion.     Plan: She will follow up with [place] in 8 weeks see what type of response she had to the injection she knows to wait at least 6 months between injections with the supplemental injections.  Questions were encouraged and answered at length.

## 2019-10-20 ENCOUNTER — Ambulatory Visit (INDEPENDENT_AMBULATORY_CARE_PROVIDER_SITE_OTHER)
Admission: RE | Admit: 2019-10-20 | Discharge: 2019-10-20 | Disposition: A | Discharge status: Home / Self Care | Payer: Medicare Other | Source: Ambulatory Visit | Attending: Internal Medicine | Admitting: Internal Medicine

## 2019-10-20 ENCOUNTER — Encounter: Payer: Self-pay | Admitting: Internal Medicine

## 2019-10-20 ENCOUNTER — Other Ambulatory Visit: Payer: Self-pay

## 2019-10-20 ENCOUNTER — Ambulatory Visit (INDEPENDENT_AMBULATORY_CARE_PROVIDER_SITE_OTHER): Payer: Medicare Other | Admitting: Internal Medicine

## 2019-10-20 VITALS — BP 128/82 | HR 101 | Temp 97.1°F | Ht 66.0 in | Wt 385.4 lb

## 2019-10-20 DIAGNOSIS — J4540 Moderate persistent asthma, uncomplicated: Secondary | ICD-10-CM

## 2019-10-20 DIAGNOSIS — J4541 Moderate persistent asthma with (acute) exacerbation: Secondary | ICD-10-CM

## 2019-10-20 DIAGNOSIS — Z23 Encounter for immunization: Secondary | ICD-10-CM

## 2019-10-20 DIAGNOSIS — G4733 Obstructive sleep apnea (adult) (pediatric): Secondary | ICD-10-CM

## 2019-10-20 NOTE — Progress Notes (Signed)
HPI F former smoker followed for OSA, Asthma, complicated by  REM Behavior Disorder (Neurology),  Anemia, Arthritis, Asthma, Degenerative Disk Disease, EDS, GERD, Glaucoma, Hypothyroid, Morbid Obesity,  Dyspnea due to Obesity and Asthma -> 05/05/2008: Spirometry suggests restriction. CXR 04/19/2008  Normal. CT 02/10/2008  - Neg for PE. No infiltrates -> Methacholine Challenge Test: 06/25/2008: Positive PC20 between 1-4  - 10/11/2009: fev1 2L/70% -> start pulmicort  -11/08/2009: fev1 2.24L/78% and better - > switch to QVAR due to cost - May 2012: fev1 2.32L/85% and normal but symptomactic -> change QVAR to dulera sample,  6day pred burst - Jul 13, 2010: Fev1 2.5L/94%, FVC 3.21/98% and improved -> continue dulera -10/26/2010 FEV1 2.33 L/87%, FVC 2.87 L/88% >cont on dulera  - med calendar 10/26/10  NPSG 08/19/08- AHI 2.9/hr, REM, desaturation to 92%, body weight 328 lbs. HST 07/08/2018- AHI 9.2/ hr, desaturation to 84%, body weight 371 lbs  --------------------------------------------------------------------    04/16/19- 53 yoF former smoker followed for OSA, Asthma( Dr Chase Caller), complicated by  REM Behavior Disorder (Neurology),  Anemia, Arthritis, Asthma, Degenerative Disk Disease, EDS, GERD, Glaucoma, Hypothyroid, Morbid Obesity,  CPAP 5-20/ [employer] Download compliance 100%, AHI 3.5/ hr Body weight today-383 lbs Dulera 200, Singulair, Provigil 200, gabapentin 800 4 x/ d, Clonazepam 1 mg HS,  Neb albuterol Getting joint injections for knee pain. Had flu vax, not covid vax. Used rescue inhaler twice yest. In past week has had sense of mucus clogging upper airway, mainly when lying down. Mucinex some help. Clear mucus. Hadn't tried nebulizer, afraid it "might spread infection down in my lungs".  10/20/19- 19 yoF former smoker followed for OSA, Asthma, complicated by REM Behavior Disorder (Neurology),  Anemia, Arthritis, Asthma, Degenerative Disk Disease, EDS, GERD, Glaucoma, Hypothyroid, Morbid Obesity,   CPAP 5-20/ [employer] Neurology prescribes clonazepam for RBD, modafinil for EDS. Dulera 200, Neb albuterol, Proair hfa, Singulair,  Download- compliance 100%, AHI 2.1/ hr Body weight today- 385 lbs Covid Vax- [employer]    Discussed potential for booster. -----pt is here for cpap compliance.pt didn't get xray last visit Reports being very comfortable with and sleeping well with her CPAP. Pressure range 10-16.  Rarely gets high leak with mask disruption, but usually fine.  Breathing has been better with les DOE but she uses rescue fairly often if outdoors walking in heat. Not much wheeze or cough. Discussed indication for albuterol.  ROS-see HPI  + = positive Constitutional:    weight loss, night sweats, fevers, chills,+ fatigue, lassitude. HEENT:    headaches, difficulty swallowing, tooth/dental problems, sore throat,       sneezing, itching, ear ache, nasal congestion, post nasal drip, snoring CV:    chest pain, orthopnea, PND, swelling in lower extremities, anasarca,                                  dizziness, palpitations Resp:  + shortness of breath with exertion or at rest.                productive cough,   non-productive cough, coughing up of blood.              change in color of mucus.  wheezing.   Skin:    rash or lesions. GI:  No-   heartburn, indigestion, abdominal pain, nausea, vomiting, diarrhea,                 change in bowel habits,  loss of appetite GU: dysuria, change in color of urine, no urgency or frequency.   flank pain. MS:   joint pain, stiffness, decreased range of motion, back pain. Neuro-     nothing unusual Psych:  change in mood or affect.  depression or anxiety.   memory loss.  OBJ- Physical Exam General- Alert, Oriented, Affect-appropriate, Distress- none acute, + morbidly obese Skin- rash-none, lesions- none, excoriation- none Lymphadenopathy- none Head- atraumatic            Eyes- Gross vision intact, PERRLA, conjunctivae and secretions clear             Ears- Hearing, canals-normal            Nose- Clear, no-Septal dev, mucus, polyps, erosion, perforation             Throat- Mallampati II-III, mucosa clear , drainage- none, tonsils- atrophic, + teeth Neck- flexible , trachea midline, no stridor , thyroid nl, carotid no bruit Chest - symmetrical excursion , unlabored           Heart/CV- RRR , no murmur , no gallop  , no rub, nl s1 s2                           - JVD- none , edema- none, stasis changes- none, varices- none           Lung- clear to P&A, wheeze- none, cough- none , dullness-none, rub- none +very talkative without respiratory effort or distress           Chest wall-  Abd-  Br/ Gen/ Rectal- Not done, not indicated Extrem- cyanosis- none, clubbing, none, atrophy- none, strength- nl    + cane Neuro- grossly intact to observation

## 2019-10-20 NOTE — Assessment & Plan Note (Signed)
Severely overweight, compounded by arthritis Encourage diet, potentially into a range where she couldl be bariatric surgery candidate.

## 2019-10-20 NOTE — Assessment & Plan Note (Signed)
No recent exacerbation and clear on exam. Much of her exertional dyspnea reflects morbid obesity and deconditioning. Plan- CXR

## 2019-10-20 NOTE — Patient Instructions (Signed)
Order- CXR dx Asthma moderate persistent  Order- Flu vax  We can continue CPAP auto 5-20 standard   Please call if we can help

## 2019-10-20 NOTE — Assessment & Plan Note (Signed)
Benefits from CPAP with good compliance and control Plan- continue auto 5-20 

## 2019-10-27 ENCOUNTER — Telehealth: Payer: Self-pay | Admitting: Adult Health

## 2019-10-27 NOTE — Telephone Encounter (Signed)
Submitted PA for Modafinil 200mg . Key is [id number]. PA has been approved until 04/25/20.

## 2019-11-16 ENCOUNTER — Ambulatory Visit (INDEPENDENT_AMBULATORY_CARE_PROVIDER_SITE_OTHER): Payer: Medicare Other | Admitting: Physician Assistant

## 2019-11-16 ENCOUNTER — Ambulatory Visit: Payer: Self-pay

## 2019-11-16 ENCOUNTER — Encounter: Payer: Self-pay | Admitting: Physician Assistant

## 2019-11-16 DIAGNOSIS — M25562 Pain in left knee: Secondary | ICD-10-CM

## 2019-11-16 NOTE — Progress Notes (Signed)
Office Visit Note   Patient: Melissa Rowe           Date of Birth: 05-08-1965           MRN: [id number]  Visit Date: 11/16/2019              Requested by: Marton Redwood, MD [address] [place],  [place] [address] PCP: Marton Redwood, MD   Assessment & Plan: Visit Diagnoses:  1. Acute pain of left knee     Plan: Quad strengthening. Follow up as needed.    Follow-Up Instructions: Return if symptoms worsen or fail to improve.   Orders:  Orders Placed This Encounter  Procedures   XR Knee 1-2 Views Left   No orders of the defined types were placed in this encounter.     Procedures: No procedures performed   Clinical Data: No additional findings.   Subjective: Chief Complaint  Patient presents with   Left Knee - Follow-up   Right Knee - Follow-up    HPI Melissa Rowe is well-known to our department service comes in today due to left knee pain.  She states that she is in a chair when her phone had fallen and she reached for it and the whole picture turned over.  Since that time she has had increased pain in the left knee.  She also notes significant swelling in the left knee.  She states the knees were doing well prior to this.  She had recently undergone supplemental injections in the knees.  She states that the right knee is doing well.  She is nondiabetic. Review of Systems No fevers or chills.  Objective: Vital Signs: LMP 04/06/2015   Physical Exam General: Well-developed well-nourished female no acute distress mood affect appropriate. Ortho Exam Right knee good range of motion without pain.  Left knee good range of motion.  She has tenderness medial aspect of the knee.  No gross ability valgus varus stressing.  No abnormal warmth or erythema of the left knee. Specialty Comments:  No specialty comments available.  Imaging: XR Knee 1-2 Views Left  Result Date: 11/16/2019 Left knee 2 views: No acute fracture. Knee well located. Tricompartmental changes  with bone on bone medial joint line.     PMFS History: Patient Active Problem List   Diagnosis Date Noted   Left carpal tunnel syndrome 09/23/2018   Arm numbness left 06/16/2018   Excessive daytime sleepiness 12/16/2017   Unilateral primary osteoarthritis, left knee 08/14/2016   Unilateral primary osteoarthritis, right knee 08/14/2016   Chronic pain of both knees 08/14/2016   REM behavioral disorder 08/14/2016   Primary osteoarthritis of both knees 06/14/2016   Allodynia 03/26/2014   Meralgia paraesthetica 02/26/2014   Degeneration of lumbar or lumbosacral intervertebral disc 02/26/2014   Symptomatic mammary hypertrophy 12/16/2013   Acute bronchitis 04/02/2013   Flu-like symptoms 04/02/2013   Blood in stool 02/24/2013   Iron deficiency anemia, unspecified 02/24/2013   Asthma with acute exacerbation 12/28/2012   Post-nasal drainage 09/02/2012   Arthritis of knee, right 03/18/2012   Preoperative respiratory examination 03/14/2012   Preop respiratory exam 02/28/2012   Acute URI 02/12/2012   Vitamin D deficiency 09/21/2010   MORBID OBESITY 10/14/2008   Obstructive sleep apnea 10/14/2008   Moderate persistent asthma 06/02/2008   Past Medical History:  Diagnosis Date   Anemia sept /oct 2014   Arthritis    Asthma    Constipation    Diverticulitis    Dyspnea    with  activity   Environmental allergies    Frequency of urination    Gastroenteritis    GERD (gastroesophageal reflux disease)    Glaucoma    Hemorrhoids    external with some bleeding   Hyperlipidemia    Hypothyroidism    Neuropathy    Obesity    OSA (obstructive sleep apnea)    wears CPAP night pt does not know settings   Urgency of urination     Family History  Problem Relation Age of Onset   Heart murmur Mother        rheumatoid arthritis   Heart attack Mother    COPD Mother    Diabetes Mother    Breast cancer Mother    Prostate cancer Father     Kidney failure Father    Asthma Brother        and mother both as a child    Past Surgical History:  Procedure Laterality Date   BREAST REDUCTION SURGERY Bilateral 12/16/2013   Procedure: BILATERAL BREAST REDUCTION;  Surgeon: Theodoro Kos, DO;  Location: [facility];  Service: Plastics;  Laterality: Bilateral;   BREAST REDUCTION SURGERY Bilateral    CARPAL TUNNEL RELEASE     right   CARPAL TUNNEL RELEASE Right    CARPAL TUNNEL RELEASE Left 10/10/2018   Procedure: LEFT CARPAL TUNNEL RELEASE;  Surgeon: Mcarthur Rossetti, MD;  Location: [facility];  Service: Orthopedics;  Laterality: Left;   CHOLECYSTECTOMY  2008   CHOLECYSTECTOMY     COLONOSCOPY     COLONOSCOPY WITH PROPOFOL N/A 02/24/2013   Procedure: COLONOSCOPY WITH PROPOFOL;  Surgeon: Ladene Artist, MD;  Location: [facility];  Service: Endoscopy;  Laterality: N/A;   KNEE ARTHROSCOPY  03/18/2012   Procedure: ARTHROSCOPY KNEE;  Surgeon: Mcarthur Rossetti, MD;  Location: [facility];  Service: Orthopedics;  Laterality: Right;  Right knee arthroscopy with debridement and three compartment chondroplasty   polyps removal  2007   REDUCTION MAMMAPLASTY     TUBAL LIGATION  1992   tubal ligaton     Social History   Occupational History   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

## 2019-11-30 ENCOUNTER — Encounter: Payer: Self-pay | Admitting: Physician Assistant

## 2019-11-30 ENCOUNTER — Ambulatory Visit (INDEPENDENT_AMBULATORY_CARE_PROVIDER_SITE_OTHER): Payer: Medicare Other | Admitting: Physician Assistant

## 2019-11-30 DIAGNOSIS — M7052 Other bursitis of knee, left knee: Secondary | ICD-10-CM | POA: Diagnosis not present

## 2019-11-30 DIAGNOSIS — M1712 Unilateral primary osteoarthritis, left knee: Secondary | ICD-10-CM | POA: Diagnosis not present

## 2019-11-30 MED ORDER — METHYLPREDNISOLONE ACETATE 40 MG/ML IJ SUSP
40.0000 mg | INTRAMUSCULAR | Status: AC | PRN
  Administered 2019-11-30: 40 mg via INTRAMUSCULAR

## 2019-11-30 MED ORDER — LIDOCAINE HCL 1 % IJ SOLN
1.0000 mL | INTRAMUSCULAR | Status: AC | PRN
  Administered 2019-11-30: 1 mL

## 2019-11-30 NOTE — Progress Notes (Signed)
   Procedure Note  Patient: Melissa Rowe             Date of Birth: 08/12/1965           MRN: [id number]             Visit Date: 11/30/2019 HPI: Ms. Ludden returns the left knee pain.  She states she is doing better states her pain is 4 out of 10 pain worse.  That is constant pain she points over the medial aspect of the knee she has [facility] it [facility] aquatic [facility] and is doing exercise there for the knee.  She is using a cane to ambulate.  She has had no new injury to the knee since last visit.  Physical exam: Left knee good range of motion.  No tenderness along medial lateral joint line.  She has tenderness on pes anserinus region of the left knee. Procedures: Visit Diagnoses:  1. Pes anserinus bursitis of left knee   2. Unilateral primary osteoarthritis, left knee     Trigger Point Inj  Date/Time: 11/30/2019 11:15 AM Performed by: Pete Pelt, PA-C Authorized by: Pete Pelt, PA-C   Indications:  Pain Total # of Trigger Points:  1 Location: lower extremity   Needle Size:  25 G Approach:  Medial Medications #1:  1 mL lidocaine 1 %; 40 mg methylPREDNISolone acetate 40 MG/ML   Plan: Encouraged her to continue to work on quad strengthening continue with water aerobics.  Discussed weight loss.  Follow-up as needed.

## 2019-12-04 ENCOUNTER — Other Ambulatory Visit: Payer: Self-pay | Admitting: Internal Medicine

## 2019-12-04 DIAGNOSIS — N63 Unspecified lump in unspecified breast: Secondary | ICD-10-CM

## 2019-12-22 ENCOUNTER — Ambulatory Visit
Admission: RE | Admit: 2019-12-22 | Discharge: 2019-12-22 | Disposition: A | Discharge status: Home / Self Care | Payer: Medicare Other | Source: Ambulatory Visit | Attending: Internal Medicine | Admitting: Internal Medicine

## 2019-12-22 ENCOUNTER — Other Ambulatory Visit: Payer: Self-pay

## 2019-12-22 DIAGNOSIS — N63 Unspecified lump in unspecified breast: Secondary | ICD-10-CM

## 2020-01-28 ENCOUNTER — Encounter: Payer: Self-pay | Admitting: Gastroenterology

## 2020-03-09 ENCOUNTER — Ambulatory Visit: Payer: Medicare Other | Admitting: Gastroenterology

## 2020-03-14 ENCOUNTER — Ambulatory Visit: Payer: Medicare Other | Admitting: Neurology

## 2020-04-04 ENCOUNTER — Telehealth: Payer: Self-pay | Admitting: Orthopaedic Surgery

## 2020-04-04 NOTE — Telephone Encounter (Signed)
Noted  

## 2020-04-04 NOTE — Telephone Encounter (Signed)
Pt would like to get another bilateral knee synvisc injection; her last one was 09/22/20. She would like a CB once approved and ready to schedule

## 2020-04-08 ENCOUNTER — Telehealth: Payer: Self-pay

## 2020-04-08 NOTE — Telephone Encounter (Signed)
Submitted VOB for SynviscOne, bilateral knee.  Pending BV. 

## 2020-04-18 ENCOUNTER — Telehealth: Payer: Self-pay

## 2020-04-18 NOTE — Telephone Encounter (Signed)
Approved, SynviscOne, bilateral knee. [facility] Patient will be responsible for 20% OOP. Co-pay of $30.00 No PA required  Appt. 04/28/2020 with Erskine Emery

## 2020-04-28 ENCOUNTER — Other Ambulatory Visit: Payer: Self-pay

## 2020-04-28 ENCOUNTER — Encounter: Payer: Self-pay | Admitting: Physician Assistant

## 2020-04-28 ENCOUNTER — Ambulatory Visit: Payer: Medicare Other | Admitting: Physician Assistant

## 2020-04-28 DIAGNOSIS — M1712 Unilateral primary osteoarthritis, left knee: Secondary | ICD-10-CM | POA: Diagnosis not present

## 2020-04-28 DIAGNOSIS — M1711 Unilateral primary osteoarthritis, right knee: Secondary | ICD-10-CM

## 2020-04-28 MED ORDER — LIDOCAINE HCL 1 % IJ SOLN
0.5000 mL | INTRAMUSCULAR | Status: AC | PRN
  Administered 2020-04-28: .5 mL

## 2020-04-28 MED ORDER — HYLAN G-F 20 48 MG/6ML IX SOSY
48.0000 mg | PREFILLED_SYRINGE | INTRA_ARTICULAR | Status: AC | PRN
  Administered 2020-04-28: 48 mg via INTRA_ARTICULAR

## 2020-04-28 NOTE — Progress Notes (Signed)
   Procedure Note  Patient: Melissa Rowe             Date of Birth: [birth year]           MRN: [id number]             Visit Date: 04/28/2020 Melissa Rowe is diagnosed with osteoarthritis of the knee(s).    Radiographs show evidence of joint space narrowing, osteophytes, subchondral sclerosis and/or subchondral cysts.  This patient has knee pain which interferes with functional and activities of daily living.    This patient has experienced inadequate response, adverse effects and/or intolerance with conservative treatments such as acetaminophen, NSAIDS, topical creams, physical therapy or regular exercise, knee bracing and/or weight loss.   This patient has experienced inadequate response or has a contraindication to intra articular steroid injections for at least 3 months.   This patient is not scheduled to have a total knee replacement within 6 months of starting treatment with viscosupplementation. Patient does note that she has lost some weight since we last saw her and that this is definitely benefited her ability to ambulate with diminished pain.  Physical exam:  Bilateral knees: Good range of motion of both knees.  No abnormal warmth erythema no instability.  Procedures: Visit Diagnoses:  1. Unilateral primary osteoarthritis, right knee   2. Unilateral primary osteoarthritis, left knee     Large Joint Inj: bilateral knee on 04/28/2020 11:38 AM Indications: pain Details: 22 G 1.5 in needle, anterolateral approach  Arthrogram: No  Medications (Right): 0.5 mL lidocaine 1 %; 48 mg Hylan 48 MG/6ML Medications (Left): 0.5 mL lidocaine 1 %; 48 mg Hylan 48 MG/6ML Outcome: tolerated well, no immediate complications Procedure, treatment alternatives, risks and benefits explained, specific risks discussed. Consent was given by the patient. Immediately prior to procedure a time out was called to verify the correct patient, procedure, equipment, support staff and site/side marked as  required. Patient was prepped and draped in the usual sterile fashion.    Plan: She will continue to work on weight loss, quad strengthening and she understands to wait at least 6 months between supplemental injections.  We will see her back as needed.  Questions encouraged and answered at length

## 2020-07-28 ENCOUNTER — Ambulatory Visit: Payer: Medicare Other | Admitting: Neurology

## 2020-08-16 ENCOUNTER — Encounter: Payer: Self-pay | Admitting: Neurology

## 2020-08-16 ENCOUNTER — Other Ambulatory Visit: Payer: Self-pay

## 2020-08-16 ENCOUNTER — Ambulatory Visit: Payer: Medicare Other | Admitting: Neurology

## 2020-08-16 VITALS — BP 145/91 | HR 99 | Ht 66.0 in | Wt 354.5 lb

## 2020-08-16 DIAGNOSIS — G4719 Other hypersomnia: Secondary | ICD-10-CM

## 2020-08-16 DIAGNOSIS — G4733 Obstructive sleep apnea (adult) (pediatric): Secondary | ICD-10-CM

## 2020-08-16 DIAGNOSIS — G5602 Carpal tunnel syndrome, left upper limb: Secondary | ICD-10-CM | POA: Diagnosis not present

## 2020-08-16 DIAGNOSIS — G5711 Meralgia paresthetica, right lower limb: Secondary | ICD-10-CM

## 2020-08-16 DIAGNOSIS — G4752 REM sleep behavior disorder: Secondary | ICD-10-CM | POA: Diagnosis not present

## 2020-08-16 DIAGNOSIS — Z9989 Dependence on other enabling machines and devices: Secondary | ICD-10-CM

## 2020-08-16 MED ORDER — LIDOCAINE 5 % EX OINT: TOPICAL_OINTMENT | CUTANEOUS | 3 refills | Status: DC

## 2020-08-16 MED ORDER — CLONAZEPAM 1 MG PO TABS: 1.0000 mg | ORAL_TABLET | Freq: Every day | ORAL | 1 refills | Status: DC

## 2020-08-16 MED ORDER — MODAFINIL 200 MG PO TABS: 200.0000 mg | ORAL_TABLET | Freq: Every day | ORAL | 1 refills | Status: DC

## 2020-08-16 MED ORDER — GABAPENTIN 800 MG PO TABS: ORAL_TABLET | ORAL | 3 refills | Status: DC

## 2020-08-16 MED ORDER — OXCARBAZEPINE 300 MG PO TABS: 600.0000 mg | ORAL_TABLET | Freq: Two times a day (BID) | ORAL | 1 refills | Status: DC

## 2020-08-16 NOTE — Progress Notes (Signed)
[facility]  PATIENT: Melissa Rowe DOB: [birth year]   REFERRING CLINICIAN: Dr. [name] HISTORY FROM: Patient  REASON FOR VISIT: Lateral thigh pain   HISTORICAL  CHIEF COMPLAINT:  Chief Complaint  Patient presents with   Follow-up    RM 1, alone. Last seen 09/10/2019. Needs refill on valium.     HISTORY OF PRESENT ILLNESS:  Melissa Rowe is a 55 y.o. woman with right anterolateral thigh numbness and pain due to a Lateral femoral cutaneous neuropathy and a sleep disorder.     Update  08/16/2020 She has numbness and mild dysesthesias in the distribution of the right LFCN in the anterolateral thigh. The medications help a lot.   She had a left CTR surgery for CTS and numbness is much better though she has mild weakness.    She had right CTR many years ago.         She is having active dreams most nights.   She has woken up screaming a few ties with bad dreams. And has swung at her husband while asleep.    She did much better on clonazepam 1 mg (only 1-2 times a month and they were milder)   She sometimes talks in her sleep.   She has OSA and uses CPAP.   Her EDS is better with modafinil and now one short nap a day keeps her in good shape.     She has no cataplexy.     EPWORTH SLEEPINESS SCALE (On Modafinil)  On a scale of 0 - 3 what is the chance of dozing:  Sitting and Reading:   1 Watching TV:    1 Sitting inactive in a public place: 1 Passenger in car for one hour: 0 Lying down to rest in the afternoon: 3 Sitting and talking to someone: 1 Sitting quietly after lunch:  0 In a car, stopped in traffic:   Does not drive   Total (out of 24):      7/24    (was 16/24 before modafinil)       LFCN History:   She had breast reduction surgery on November 4th and she reports that the right thigh was 'going to sleep' with numbness and tingling later that night.    Over the next week it got more painful and by December, the pain was very severe.  Pain was  apparently so bad that she was screaming out in the middle of the night. She describes a gnawing pain that is constant but also has a stabbing pain frequently.   She was worked in at her Primary care provider's office, Dr. [name], and was diagnosed with right meralgia paresthetica.   Lyrica was started.  She saw neurosurgery and the Lyrica dose was increased from 75  to 150 mg twice a day with only minimal relief. She returned to her primary care provider's office on 02/16/2014 and tramadol was added.    Of note, she gained about 40 pounds during the 4 weeks between her two primary care visits.   On the higher dose of Lyrica, the stabbing pain was helped but the more annoying constant pain was not. She was switched from Lyrica to gabapentin with benefit (02/2014).      Lamotrigine was not tolerated (added 03/2014).   Lidocaine ointment helps some (added 2/20165).    Oxcabazepine added 09/2014 with improvement, dose slwowly titrated to 600 mg po bid.  around May 2017 she had a procedure at [place] [facility] with improvement  of the anterior half of the painful region but not the lateral half.[name]        REVIEW OF SYSTEMS:  Constitutional: No fevers, chills, sweats, or change in appetite Eyes: No visual changes, double vision, eye pain Ear, nose and throat: No hearing loss, ear pain, nasal congestion, sore throat Cardiovascular: No chest pain, palpitations Respiratory:  No shortness of breath at rest or with exertion.   No wheezes.    She has OSA and is on CPAP. GastrointestinaI: No nausea, vomiting, diarrhea, abdominal pain, fecal incontinence Genitourinary:  No dysuria, urinary retention or frequency.  No nocturia. Musculoskeletal:  No neck pain, some back pain, moderate knee pain   Integumentary: No rash, pruritus, skin lesions Neurological: as above  ALLERGIES: Allergies  Allergen Reactions   Lamictal [Lamotrigine] Itching    Migraine    Metformin And Related     Diarrhea, fatigue   Other Rash     Powder inside latex gloves - rash and sores     HOME MEDICATIONS: Outpatient Medications Prior to Visit  Medication Sig Dispense Refill   albuterol (PROAIR HFA) 108 (90 Base) MCG/ACT inhaler Inhale 2 puffs every 6 hours as needed- rescue 54 g 4   Calcium Carb-Cholecalciferol (CALCIUM 600 + D PO) Take 1 tablet by mouth daily.     cyclobenzaprine (FLEXERIL) 10 MG tablet Take 1 tablet (10 mg total) by mouth 2 (two) times daily as needed for muscle spasms. 20 tablet 0   levothyroxine (SYNTHROID) 200 MCG tablet Take 200 mcg by mouth daily before breakfast.     levothyroxine (SYNTHROID) 300 MCG tablet Take 300 mcg by mouth daily.     lidocaine (LIDODERM) 5 % Place 1 patch onto the skin daily. Remove & Discard patch within 12 hours or as directed by MD 30 patch 0   mometasone-formoterol (DULERA) 200-5 MCG/ACT AERO USE 2 INHALATIONS BY MOUTH  TWICE DAILY 39 g 3   montelukast (SINGULAIR) 10 MG tablet Take 10 mg by mouth at bedtime.      Multiple Vitamin (MULTIVITAMIN) capsule Take 1 capsule by mouth daily.      Omeprazole-Sodium Bicarbonate (ZEGERID) 20-1100 MG CAPS capsule Take 1 capsule by mouth daily before breakfast.     oxybutynin (DITROPAN XL) 15 MG 24 hr tablet Take 15 mg by mouth daily.     OZEMPIC, 1 MG/DOSE, 4 MG/3ML SOPN INJECT 1MG  ONCE A WEEK     polyethylene glycol (MIRALAX / GLYCOLAX) packet Take 17 g by mouth daily as needed for moderate constipation.      rosuvastatin (CRESTOR) 10 MG tablet Take 10 mg by mouth at bedtime.     traMADol (ULTRAM) 50 MG tablet Take 50 mg by mouth 3 (three) times daily as needed.     clonazePAM (KLONOPIN) 1 MG tablet Take 1 tablet (1 mg total) by mouth at bedtime. 90 tablet 1   gabapentin (NEURONTIN) 800 MG tablet 1 tab 4 times daily 360 tablet 3   modafinil (PROVIGIL) 200 MG tablet Take 1 tablet (200 mg total) by mouth daily. 90 tablet 1   Oxcarbazepine (TRILEPTAL) 300 MG tablet Take 2 tablets (600 mg total) by mouth 2 (two) times daily. 360 tablet 1    metFORMIN (GLUCOPHAGE) 500 MG tablet Take 500 mg by mouth 2 (two) times daily.     spironolactone (ALDACTONE) 25 MG tablet Take 25 mg by mouth daily.     No facility-administered medications prior to visit.    PAST MEDICAL HISTORY: Past Medical History:  Diagnosis  Date   Anemia sept /oct 2014   Arthritis    Asthma    Constipation    Diverticulitis    Dyspnea    with activity   Environmental allergies    Frequency of urination    Gastroenteritis    GERD (gastroesophageal reflux disease)    Glaucoma    Hemorrhoids    external with some bleeding   Hyperlipidemia    Hypothyroidism    Neuropathy    Obesity    OSA (obstructive sleep apnea)    wears CPAP night pt does not know settings   Urgency of urination     PAST SURGICAL HISTORY: Past Surgical History:  Procedure Laterality Date   BREAST BIOPSY Left 12/2016   fibroadenoma   BREAST REDUCTION SURGERY Bilateral 12/16/2013   Procedure: BILATERAL BREAST REDUCTION;  Surgeon: Theodoro Kos, DO;  Location: [facility];  Service: Plastics;  Laterality: Bilateral;   BREAST REDUCTION SURGERY Bilateral    CARPAL TUNNEL RELEASE     right   CARPAL TUNNEL RELEASE Right    CARPAL TUNNEL RELEASE Left 10/10/2018   Procedure: LEFT CARPAL TUNNEL RELEASE;  Surgeon: Mcarthur Rossetti, MD;  Location: [facility];  Service: Orthopedics;  Laterality: Left;   CHOLECYSTECTOMY  2008   CHOLECYSTECTOMY     COLONOSCOPY     COLONOSCOPY WITH PROPOFOL N/A 02/24/2013   Procedure: COLONOSCOPY WITH PROPOFOL;  Surgeon: Ladene Artist, MD;  Location: [facility];  Service: Endoscopy;  Laterality: N/A;   KNEE ARTHROSCOPY  03/18/2012   Procedure: ARTHROSCOPY KNEE;  Surgeon: Mcarthur Rossetti, MD;  Location: [facility];  Service: Orthopedics;  Laterality: Right;  Right knee arthroscopy with debridement and three compartment chondroplasty   polyps removal  2007   REDUCTION MAMMAPLASTY     TUBAL LIGATION  1992    FAMILY HISTORY: Family History  Problem  Relation Age of Onset   Heart murmur Mother        rheumatoid arthritis   Heart attack Mother    COPD Mother    Diabetes Mother    Breast cancer Mother    Prostate cancer Father    Kidney failure Father    Asthma Brother        and mother both as a child    SOCIAL HISTORY:  Social History   Socioeconomic History   Marital status: Married    Spouse name: Not on file   Number of children: 2   Years of education: Not on file   Highest education level: Not on file  Occupational History   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  Tobacco Use   Smoking status: Former    Packs/day: 2.00    Years: 7.00    Pack years: 14.00    Types: Cigarettes    Quit date: 02/12/1990    Years since quitting: 30.5   Smokeless tobacco: Never  Vaping Use   Vaping Use: Never used  Substance and Sexual Activity   Alcohol use: Not Currently    Comment: rare   Drug use: No   Sexual activity: Not Currently  Other Topics Concern   Not on file  Social History Narrative   Not on file   Social Determinants of Health   Financial Resource Strain: Not on file  Food Insecurity: Not on file  Transportation Needs: Not on file  Physical Activity: Not on file  Stress: Not on file  Social Connections: Not on  file  Intimate Partner Violence: Not on file     PHYSICAL EXAM  Vitals:   08/16/20 1341  BP: (!) 145/91  Pulse: 99  Weight: (!) 354 lb 8 oz (160.8 kg)  Height: 5\' 6"  (1.676 m)    Body mass index is 57.22 kg/m.   General: The patient Is an obese woman in no acute distress.   Neurologic Exam  Mental status: The patient is alert and oriented x 3 at the time of the examination. The patient has apparent normal recent and remote memory, with an apparently normal attention span and concentration ability.   Speech is normal.  Cranial nerves: Extraocular movements are full.  Facial strength is normal.  Trapezius and  sternocleidomastoid strength is normal. No dysarthria is noted.     Motor:  Muscle bulk and tone are normal. Strength is  5 / 5 in all 4 extremities, including left hand  Sensory:    She has reduced sensation to touch in the anterolateral right thigh.  She has mild allodynia and hyperpathia in this distribution,.  She has normal sensation to touch elsewhere..  Gait and station: Station is normal but the gait is arthritic with a reduced stride.  (She has significant knee arthritis)   Reflexes: Deep tendon reflexes are symmetric and normal bilaterally.     DIAGNOSTIC DATA (LABS, IMAGING, TESTING) - I reviewed patient records, labs, notes, testing and imaging myself where available.  Lab Results  Component Value Date   WBC 7.3 01/28/2019   HGB 12.8 01/28/2019   HCT 41.5 01/28/2019   MCV 68.0 (L) 01/28/2019   PLT 409 (H) 01/28/2019      Component Value Date/Time   NA 140 01/28/2019 1628   K 3.4 (L) 01/28/2019 1628   CL 105 01/28/2019 1628   CO2 22 01/28/2019 1628   GLUCOSE 104 (H) 01/28/2019 1628   BUN 6 01/28/2019 1628   CREATININE 0.65 01/28/2019 1628   CALCIUM 9.8 01/28/2019 1628   PROT 8.0 01/28/2019 1628   ALBUMIN 4.1 01/28/2019 1628   AST 24 01/28/2019 1628   ALT 26 01/28/2019 1628   ALKPHOS 76 01/28/2019 1628   BILITOT 0.4 01/28/2019 1628   GFRNONAA >60 01/28/2019 1628   GFRAA >60 01/28/2019 1628   Lab Results  Component Value Date   CHOL 209 (H) 09/22/2009   HDL 62 09/22/2009   LDLCALC 116 (H) 09/22/2009   TRIG 156 (H) 09/22/2009   CHOLHDL 3.4 Ratio 09/22/2009   Lab Results  Component Value Date   HGBA1C 5.7 (H) 12/16/2013   No results found for: VITAMINB12 Lab Results  Component Value Date   TSH 6.759 (H) 01/11/2009    CLINICAL DATA:  Chronic low back pain for 7 years   EXAM: MRI LUMBAR SPINE WITHOUT CONTRAST   TECHNIQUE: Multiplanar, multisequence MR imaging was performed. No intravenous contrast was administered.   COMPARISON:  12/10/2006.    FINDINGS: There is a mild levoconvex curve of the lumbar spine with the apex at L3. The spinal cord terminates at the L1 level. The paraspinal soft tissues are within normal limits. Marrow signal is normal.   T11-T12 and T12-L1 appear normal.   L1-L2:  Negative.   L2-L3: Mild disc desiccation with shallow broad-based central protrusion but no resulting stenosis.   L3-L4: Disc desiccation and mild loss of height. Right foraminal protrusion is present with associated annular tear. This produces very mild right foraminal stenosis with the protrusion contacting exiting right L3 nerve in the foramen. The  left neural foramen and central canal are patent. Disc protrusion just contacts the descending right L4 nerve as it approaches the lateral recess.   L4-L5: Disc desiccation. Central annular tear with right paracentral protrusion. Very mild central stenosis. Mild encroachment on the right lateral recess and descending right L5 nerve. The neural foramina appear adequately patent.   L5-S1: Degenerated disc with loss of height. Vacuum disc. Central canal patent. Foramina appear adequately patent. Right paracentral broad-based protrusion is present mildly encroaching on the right S1 nerve in the lateral recess.   IMPRESSION: 1. L3-L4 right foraminal annular tear and protrusion with mild right foraminal stenosis potentially affecting the right L3 nerve. 2. L4-L5 annular tear and right paracentral protrusion with mild central stenosis and mild encroachment on the descending right L5 nerve. 3. L5-S1 disc degeneration with broad-based right paracentral protrusion minimally encroaching on the right lateral recess.     Electronically Signed   By: Dereck Ligas M.D.   On: 06/04/2013 08:03     ASSESSMENT AND PLAN  Meralgia paresthetica of right side  Left carpal tunnel syndrome  REM behavioral disorder  Excessive daytime sleepiness  OSA on CPAP   1.   Continue the  oxcarbazepine , gbapentin and lidocaine ointment for the lateral femoral cutaneous neuropathy.    2.   Continue clonazepam 1 mg for the REM behavior disorder.  She is on CPAP (followed by pulmonology) and I have prescribed modafinil for excessive daytime sleepiness with benefit 3.      She will return to see [place] in 6 months or sooner if there are new or worsening neurologic symptoms  Charnita Trudel A. Felecia Shelling, MD, PhD 0/10/3110, 1:62 PM Certified in Neurology, Clinical Neurophysiology, Sleep Medicine, Pain Medicine and Neuroimaging  [facility] [address] [place], [place] [address] [phone]

## 2020-09-01 ENCOUNTER — Telehealth: Payer: Self-pay | Admitting: Orthopaedic Surgery

## 2020-09-01 NOTE — Telephone Encounter (Signed)
Pt called about getting set up for a gel injection anytime after August 3rd.  Last injection was in march of 2022 CB [phone]

## 2020-09-01 NOTE — Telephone Encounter (Signed)
Talked with patient and scheduled appointment for bilateral knee, cort. Injections.

## 2020-09-15 ENCOUNTER — Ambulatory Visit (INDEPENDENT_AMBULATORY_CARE_PROVIDER_SITE_OTHER): Payer: Medicare Other | Admitting: Physician Assistant

## 2020-09-15 ENCOUNTER — Telehealth: Payer: Self-pay

## 2020-09-15 ENCOUNTER — Encounter: Payer: Self-pay | Admitting: Physician Assistant

## 2020-09-15 ENCOUNTER — Other Ambulatory Visit: Payer: Self-pay

## 2020-09-15 DIAGNOSIS — M1711 Unilateral primary osteoarthritis, right knee: Secondary | ICD-10-CM | POA: Diagnosis not present

## 2020-09-15 DIAGNOSIS — M1712 Unilateral primary osteoarthritis, left knee: Secondary | ICD-10-CM

## 2020-09-15 MED ORDER — LIDOCAINE HCL 1 % IJ SOLN
3.0000 mL | INTRAMUSCULAR | Status: AC | PRN
  Administered 2020-09-15: 3 mL

## 2020-09-15 MED ORDER — METHYLPREDNISOLONE ACETATE 40 MG/ML IJ SUSP
40.0000 mg | INTRAMUSCULAR | Status: AC | PRN
  Administered 2020-09-15: 40 mg via INTRA_ARTICULAR

## 2020-09-15 NOTE — Telephone Encounter (Signed)
Please get auth for Bilateral knee gel injection -gil pt.

## 2020-09-15 NOTE — Progress Notes (Signed)
   Procedure Note  Patient: Melissa Rowe             Date of Birth: 02-Oct-1965           MRN: [id number]             Visit Date: 09/15/2020   HPI: [name] returns today requesting injections in both knees.  She last had supplemental injections both knees 04/28/2020.  She states they are beginning to just wear off.  She is working on weight loss and has lost some 30 pounds.  She reports her diabetes under good control and is on Ozempic.  She has had no fevers chills.  No new injury to either knee.  Physical exam: Bilateral knees no abnormal warmth erythema or effusion.  Good range of motion of both knees with patellofemoral crepitus.  She ambulates with the assistance of a quad cane.   Procedures: Visit Diagnoses:  1. Unilateral primary osteoarthritis, right knee   2. Unilateral primary osteoarthritis, left knee     Large Joint Inj: bilateral knee on 09/15/2020 2:23 PM Medications (Right): 3 mL lidocaine 1 %; 40 mg methylPREDNISolone acetate 40 MG/ML Medications (Left): 3 mL lidocaine 1 %; 40 mg methylPREDNISolone acetate 40 MG/ML Consent was given by the patient. Immediately prior to procedure a time out was called to verify the correct patient, procedure, equipment, support staff and site/side marked as required. Patient was prepped and draped in the usual sterile fashion.    Plan: We will see her back sometime after September 17 the have her undergo supplemental injections both knees.  She will continue to work on quad strengthening and weight loss.  Questions were encouraged and answered.

## 2020-09-20 NOTE — Telephone Encounter (Signed)
Next available gel injection will need to be after 10/29/2020.  Will submit at the end of August, 2022.

## 2020-10-10 ENCOUNTER — Telehealth: Payer: Self-pay

## 2020-10-10 NOTE — Telephone Encounter (Signed)
VOB submitted for SynviscOne, bilateral knee. Pending BV. Appt.needed after 10/29/2020

## 2020-10-16 ENCOUNTER — Encounter: Payer: Self-pay | Admitting: Internal Medicine

## 2020-10-18 NOTE — Progress Notes (Signed)
HPI F former smoker followed for OSA, Asthma, complicated by  REM Behavior Disorder (Neurology),  Anemia, Arthritis, Asthma, Degenerative Disk Disease, EDS, GERD, Glaucoma, Hypothyroid, Morbid Obesity,  Dyspnea due to Obesity and Asthma -> 05/05/2008: Spirometry suggests restriction. CXR 04/19/2008  Normal. CT 02/10/2008  - Neg for PE. No infiltrates -> Methacholine Challenge Test: 06/25/2008: Positive PC20 between 1-4  - 10/11/2009: fev1 2L/70% -> start pulmicort  -11/08/2009: fev1 2.24L/78% and better - > switch to QVAR due to cost - May 2012: fev1 2.32L/85% and normal but symptomactic -> change QVAR to dulera sample,  6day pred burst - Jul 13, 2010: Fev1 2.5L/94%, FVC 3.21/98% and improved -> continue dulera -10/26/2010 FEV1 2.33 L/87%, FVC 2.87 L/88% >cont on dulera  - med calendar 10/26/10  NPSG 08/19/08- AHI 2.9/hr, REM, desaturation to 92%, body weight 328 lbs. HST 07/08/2018- AHI 9.2/ hr, desaturation to 84%, body weight 371 lbs  --------------------------------------------------------------------    10/20/19- 54 yoF former smoker followed for OSA, Asthma, complicated by REM Behavior Disorder (Neurology),  Anemia, Arthritis, Asthma, Degenerative Disk Disease, EDS, GERD, Glaucoma, Hypothyroid, Morbid Obesity,  CPAP 5-20/ [facility] prescribes clonazepam for RBD, modafinil for EDS. Dulera 200, Neb albuterol, Proair hfa, Singulair,  Download- compliance 100%, AHI 2.1/ hr Body weight today- 385 lbs Covid Vax- [employer]    Discussed potential for booster. -----pt is here for cpap compliance.pt didn't get xray last visit Reports being very comfortable with and sleeping well with her CPAP. Pressure range 10-16.  Rarely gets high leak with mask disruption, but usually fine.  Breathing has been better with les DOE but she uses rescue fairly often if outdoors walking in heat. Not much wheeze or cough. Discussed indication for albuterol.  10/19/20- 15 yoF former smoker (14 pkyrs) followed  for OSA, Asthma, complicated by REM Behavior Disorder (Neurology),  Anemia, Arthritis, Asthma, Degenerative Disk Disease, EDS, GERD, Glaucoma, Hypothyroid, Morbid Obesity,  CPAP 5-20/ [facility]   [facility] prescribes clonazepam for RBD, modafinil for EDS. -Dulera 200, Neb albuterol, Proair hfa, Singulair,  Download-compliance 100%, AHI 2.3/ hr Body weight-355 lbs Covid vax-1 J&J, 1 Moderna -----Pt states no concerns. Says everything is about the same, only takes her a little longer to catch her breath. Comfortable with CPAP. Download reviewed.  Some wheeze in hot weather. Inhalers adequate.  ROS-see HPI  + = positive Constitutional:    weight loss, night sweats, fevers, chills,+ fatigue, lassitude. HEENT:    headaches, difficulty swallowing, tooth/dental problems, sore throat,       sneezing, itching, ear ache, nasal congestion, post nasal drip, snoring CV:    chest pain, orthopnea, PND, swelling in lower extremities, anasarca,                                   dizziness, palpitations Resp:  + shortness of breath with exertion or at rest.                productive cough,   non-productive cough, coughing up of blood.              change in color of mucus.  wheezing.   Skin:    rash or lesions. GI:  No-   heartburn, indigestion, abdominal pain, nausea, vomiting, diarrhea,                 change in bowel habits, loss of appetite GU: dysuria, change in color of  urine, no urgency or frequency.   flank pain. MS:   joint pain, stiffness, decreased range of motion, back pain. Neuro-     nothing unusual Psych:  change in mood or affect.  depression or anxiety.   memory loss.  OBJ- Physical Exam General- Alert, Oriented, Affect-appropriate, Distress- none acute, + morbidly obese Skin- rash-none, lesions- none, excoriation- none Lymphadenopathy- none Head- atraumatic            Eyes- Gross vision intact, PERRLA, conjunctivae and secretions clear            Ears- Hearing,  canals-normal            Nose- Clear, no-Septal dev, mucus, polyps, erosion, perforation             Throat- Mallampati II-III, mucosa clear , drainage- none, tonsils- atrophic, + teeth Neck- flexible , trachea midline, no stridor , thyroid nl, carotid no bruit Chest - symmetrical excursion , unlabored           Heart/CV- RRR , no murmur , no gallop  , no rub, nl s1 s2                           - JVD- none , edema- none, stasis changes- none, varices- none           Lung- clear to P&A, wheeze- none, cough- none , dullness-none, rub- none +very talkative without respiratory effort or distress           Chest wall-  Abd-  Br/ Gen/ Rectal- Not done, not indicated Extrem- cyanosis- none, clubbing, none, atrophy- none, strength- nl    + cane Neuro- grossly intact to observation

## 2020-10-19 ENCOUNTER — Ambulatory Visit (INDEPENDENT_AMBULATORY_CARE_PROVIDER_SITE_OTHER): Payer: Medicare Other

## 2020-10-19 ENCOUNTER — Other Ambulatory Visit: Payer: Self-pay

## 2020-10-19 ENCOUNTER — Ambulatory Visit (INDEPENDENT_AMBULATORY_CARE_PROVIDER_SITE_OTHER): Payer: Medicare Other | Admitting: Internal Medicine

## 2020-10-19 ENCOUNTER — Encounter: Payer: Self-pay | Admitting: Internal Medicine

## 2020-10-19 VITALS — BP 136/64 | HR 101 | Temp 98.2°F | Ht 66.0 in | Wt 355.4 lb

## 2020-10-19 DIAGNOSIS — J4540 Moderate persistent asthma, uncomplicated: Secondary | ICD-10-CM

## 2020-10-19 DIAGNOSIS — G4733 Obstructive sleep apnea (adult) (pediatric): Secondary | ICD-10-CM | POA: Diagnosis not present

## 2020-10-19 DIAGNOSIS — R0600 Dyspnea, unspecified: Secondary | ICD-10-CM

## 2020-10-19 DIAGNOSIS — Z9989 Dependence on other enabling machines and devices: Secondary | ICD-10-CM

## 2020-10-19 DIAGNOSIS — R0609 Other forms of dyspnea: Secondary | ICD-10-CM

## 2020-10-19 NOTE — Patient Instructions (Signed)
Order- CXR    dx dyspnea on exertion  Order- schedule PFT    dx dyspnea on exertion  Order- referral to [facility]   dx morbid obesity  Handicapped Parking application  You can continue CPAP auto 5-20. Glad it is going well for you.

## 2020-10-21 ENCOUNTER — Encounter: Payer: Self-pay | Admitting: *Deleted

## 2020-10-24 ENCOUNTER — Encounter: Payer: Self-pay | Admitting: Internal Medicine

## 2020-10-24 NOTE — Assessment & Plan Note (Signed)
Referral to [facility] for weight loss.

## 2020-10-24 NOTE — Assessment & Plan Note (Signed)
No acute change. Former smoker, aware of [place], PFT

## 2020-10-24 NOTE — Assessment & Plan Note (Signed)
Benefits from CPAP, good compliance and control Plan- continue auto 5-20

## 2020-10-31 ENCOUNTER — Telehealth: Payer: Self-pay

## 2020-10-31 NOTE — Telephone Encounter (Signed)
Approved for SynviscOne, bilateral knee. [place] Patient will be responsible for 20% OOP. Co-pay of $30.00 No PA required  Appt. 11/14/2020 with Erskine Emery

## 2020-11-14 ENCOUNTER — Encounter: Payer: Self-pay | Admitting: Physician Assistant

## 2020-11-14 ENCOUNTER — Ambulatory Visit: Payer: Medicare Other | Admitting: Physician Assistant

## 2020-11-14 DIAGNOSIS — M1712 Unilateral primary osteoarthritis, left knee: Secondary | ICD-10-CM

## 2020-11-14 DIAGNOSIS — M1711 Unilateral primary osteoarthritis, right knee: Secondary | ICD-10-CM

## 2020-11-14 MED ORDER — LIDOCAINE HCL 1 % IJ SOLN
3.0000 mL | INTRAMUSCULAR | Status: AC | PRN
  Administered 2020-11-14: 3 mL

## 2020-11-14 MED ORDER — HYLAN G-F 20 48 MG/6ML IX SOSY
48.0000 mg | PREFILLED_SYRINGE | INTRA_ARTICULAR | Status: AC | PRN
  Administered 2020-11-14: 48 mg via INTRA_ARTICULAR

## 2020-11-14 MED ORDER — HYLAN G-F 20 48 MG/6ML IX SOSY
48.0000 mg | PREFILLED_SYRINGE | INTRA_ARTICULAR | Status: AC | PRN
Start: 2020-11-14 — End: 2020-11-14
  Administered 2020-11-14: 48 mg via INTRA_ARTICULAR

## 2020-11-14 NOTE — Progress Notes (Signed)
   Procedure Note  Patient: Melissa Rowe             Date of Birth: [birth year]           MRN: [id number]             Visit Date: 11/14/2020 HPI: Ms. Demicco comes in today for Synvisc 1 injections both knees.  She has known osteoarthritis of both knees.  She has had no known injury to either knee.  No planned knee surgery next 6 months.  Physical exam: Bilateral knees no abnormal warmth erythema overall good range of motion both knees.  Procedures: Visit Diagnoses:  1. Unilateral primary osteoarthritis, left knee   2. Unilateral primary osteoarthritis, right knee     Large Joint Inj: bilateral knee on 11/14/2020 1:33 PM Indications: pain Details: 22 G 1.5 in needle, anterolateral approach  Arthrogram: No  Medications (Right): 3 mL lidocaine 1 %; 48 mg Hylan 48 MG/6ML Medications (Left): 3 mL lidocaine 1 %; 48 mg Hylan 48 MG/6ML Outcome: tolerated well, no immediate complications Procedure, treatment alternatives, risks and benefits explained, specific risks discussed. Consent was given by the patient. Immediately prior to procedure a time out was called to verify the correct patient, procedure, equipment, support staff and site/side marked as required. Patient was prepped and draped in the usual sterile fashion.    Plan: She knows to work on quad strengthening both knees.  Continue to work on weight loss.  Understands that she needs to wait least 6 months before having repeat Synvisc 1 injections.

## 2020-12-16 ENCOUNTER — Other Ambulatory Visit: Payer: Self-pay | Admitting: Internal Medicine

## 2020-12-16 DIAGNOSIS — Z1231 Encounter for screening mammogram for malignant neoplasm of breast: Secondary | ICD-10-CM

## 2021-01-20 ENCOUNTER — Ambulatory Visit
Admission: RE | Admit: 2021-01-20 | Discharge: 2021-01-20 | Disposition: A | Discharge status: Home / Self Care | Payer: Medicare Other | Source: Ambulatory Visit | Attending: Internal Medicine | Admitting: Internal Medicine

## 2021-01-20 DIAGNOSIS — Z1231 Encounter for screening mammogram for malignant neoplasm of breast: Secondary | ICD-10-CM

## 2021-02-21 ENCOUNTER — Ambulatory Visit: Payer: Medicare Other | Admitting: Family Medicine

## 2021-02-21 ENCOUNTER — Encounter: Payer: Self-pay | Admitting: Family Medicine

## 2021-02-21 VITALS — BP 119/67 | HR 98 | Ht 66.0 in | Wt 366.5 lb

## 2021-02-21 DIAGNOSIS — G5711 Meralgia paresthetica, right lower limb: Secondary | ICD-10-CM | POA: Diagnosis not present

## 2021-02-21 DIAGNOSIS — G4719 Other hypersomnia: Secondary | ICD-10-CM

## 2021-02-21 DIAGNOSIS — G4752 REM sleep behavior disorder: Secondary | ICD-10-CM

## 2021-02-21 DIAGNOSIS — G4733 Obstructive sleep apnea (adult) (pediatric): Secondary | ICD-10-CM | POA: Diagnosis not present

## 2021-02-21 DIAGNOSIS — Z9989 Dependence on other enabling machines and devices: Secondary | ICD-10-CM

## 2021-02-21 MED ORDER — GABAPENTIN 800 MG PO TABS: ORAL_TABLET | ORAL | 3 refills | Status: DC

## 2021-02-21 MED ORDER — LIDOCAINE 5 % EX OINT: TOPICAL_OINTMENT | CUTANEOUS | 3 refills | Status: AC

## 2021-02-21 MED ORDER — CLONAZEPAM 1 MG PO TABS: 1.0000 mg | ORAL_TABLET | Freq: Every day | ORAL | 1 refills | Status: DC

## 2021-02-21 MED ORDER — MODAFINIL 200 MG PO TABS: 200.0000 mg | ORAL_TABLET | Freq: Every day | ORAL | 1 refills | Status: DC

## 2021-02-21 MED ORDER — OXCARBAZEPINE 300 MG PO TABS: 600.0000 mg | ORAL_TABLET | Freq: Two times a day (BID) | ORAL | 3 refills | Status: DC

## 2021-02-21 NOTE — Progress Notes (Signed)
Chief Complaint  Patient presents with   Follow-up    Rm 1, alone. Here for 6 month f/u. Pt reports she is still having night terrors, kicking and talking in her sleep.      HISTORY OF PRESENT ILLNESS:  02/21/21 ALL:  Melissa Rowe is a 56 y.o. female here today for follow up for meralgia paresthetica, RBD and EDS.   She continues oxcarbazepine 600mg  BID, gabapentin 800mg  QID and lidocaine ointment for lateral femoral cutaneous neuropathy. She feels that pain is improving. She is getting around better. She is trying to exercise more. She is using a foot peddler for exercise. She has been working on weight loss so she can get knee replacement. She had one fall last year, no falls recently.   She continues clonazepam 1mg  QHS for RBD. She continues to have night terrors most every night. She continues CPAP (followed by pulm). Apnea reportedly well managed. Modafinil 200mg  QD helps with daytime sleepiness. She can tell a big difference if she doesn't take it.    HISTORY (copied from [name] previous note)  Melissa Rowe is a 56 y.o. woman with right anterolateral thigh numbness and pain due to a Lateral femoral cutaneous neuropathy and a sleep disorder.      Update  08/16/2020 She has numbness and mild dysesthesias in the distribution of the right LFCN in the anterolateral thigh. The medications help a lot.    She had a left CTR surgery for CTS and numbness is much better though she has mild weakness.    She had right CTR many years ago.       She is having active dreams most nights.   She has woken up screaming a few ties with bad dreams. And has swung at her husband while asleep.    She did much better on clonazepam 1 mg (only 1-2 times a month and they were milder)   She sometimes talks in her sleep.   She has OSA and uses CPAP.   Her EDS is better with modafinil and now one short nap a day keeps her in good shape.     She has no cataplexy.      EPWORTH SLEEPINESS SCALE (On  Modafinil)   On a scale of 0 - 3 what is the chance of dozing:   Sitting and Reading:                           1 Watching TV:                                      1 Sitting inactive in a public place:        1 Passenger in car for one hour:           0 Lying down to rest in the afternoon:   3 Sitting and talking to someone:          1 Sitting quietly after lunch:                   0 In a car, stopped in traffic:   Does not drive    Total (out of 24):      7/24    (was 16/24 before modafinil)      LFCN History:   She had breast reduction surgery on November 4th and  she reports that the right thigh was 'going to sleep' with numbness and tingling later that night.    Over the next week it got more painful and by December, the pain was very severe.  Pain was apparently so bad that she was screaming out in the middle of the night. She describes a gnawing pain that is constant but also has a stabbing pain frequently.   She was worked in at her Primary care providers office, Dr. [name], and was diagnosed with right meralgia paresthetica.   Lyrica was started.  She saw neurosurgery and the Lyrica dose was increased from 75  to 150 mg twice a day with only minimal relief. She returned to her primary care providers office on 02/16/2014 and tramadol was added.    Of note, she gained about 40 pounds during the 4 weeks between her two primary care visits.   On the higher dose of Lyrica, the stabbing pain was helped but the more annoying constant pain was not. She was switched from Lyrica to gabapentin with benefit (02/2014).      Lamotrigine was not tolerated (added 03/2014).   Lidocaine ointment helps some (added 2/20165).    Oxcabazepine added 09/2014 with improvement, dose slwowly titrated to 600 mg po bid.  around May 2017 she had a procedure at [facility] with improvement of the anterior half of the painful region but not the lateral half.[name]   REVIEW OF SYSTEMS: Out of a complete 14 system  review of symptoms, the patient complains only of the following symptoms, chronic leg pain, knee pain, restless sleep, and all other reviewed systems are negative.  ESS: 14   ALLERGIES: Allergies  Allergen Reactions   Lamictal [Lamotrigine] Itching    Migraine    Metformin And Related     Diarrhea, fatigue   Other Rash    Powder inside latex gloves - rash and sores      HOME MEDICATIONS: Outpatient Medications Prior to Visit  Medication Sig Dispense Refill   albuterol (PROAIR HFA) 108 (90 Base) MCG/ACT inhaler Inhale 2 puffs every 6 hours as needed- rescue 54 g 4   Calcium Carb-Cholecalciferol (CALCIUM 600 + D PO) Take 1 tablet by mouth daily.     cyclobenzaprine (FLEXERIL) 10 MG tablet Take 1 tablet (10 mg total) by mouth 2 (two) times daily as needed for muscle spasms. 20 tablet 0   levothyroxine (SYNTHROID) 300 MCG tablet Take 300 mcg by mouth daily.     mometasone-formoterol (DULERA) 200-5 MCG/ACT AERO USE 2 INHALATIONS BY MOUTH  TWICE DAILY 39 g 3   montelukast (SINGULAIR) 10 MG tablet Take 10 mg by mouth at bedtime.      Multiple Vitamin (MULTIVITAMIN) capsule Take 1 capsule by mouth daily.      Omeprazole-Sodium Bicarbonate (ZEGERID) 20-1100 MG CAPS capsule Take 1 capsule by mouth daily before breakfast.     oxybutynin (DITROPAN XL) 15 MG 24 hr tablet Take 15 mg by mouth daily.     OZEMPIC, 1 MG/DOSE, 4 MG/3ML SOPN INJECT 1MG  ONCE A WEEK     polyethylene glycol (MIRALAX / GLYCOLAX) packet Take 17 g by mouth daily as needed for moderate constipation.      psyllium (METAMUCIL SMOOTH TEXTURE) 28 % packet Take 1 packet by mouth 2 (two) times daily.     rosuvastatin (CRESTOR) 10 MG tablet Take 10 mg by mouth at bedtime.     traMADol (ULTRAM) 50 MG tablet Take 50 mg by mouth 3 (three) times  daily as needed.     clonazePAM (KLONOPIN) 1 MG tablet Take 1 tablet (1 mg total) by mouth at bedtime. 90 tablet 1   gabapentin (NEURONTIN) 800 MG tablet 1 tab 4 times daily 360 tablet 3    lidocaine (XYLOCAINE) 5 % ointment Apply to right thigh twice a day 100 g 3   modafinil (PROVIGIL) 200 MG tablet Take 1 tablet (200 mg total) by mouth daily. 90 tablet 1   Oxcarbazepine (TRILEPTAL) 300 MG tablet Take 2 tablets (600 mg total) by mouth 2 (two) times daily. 360 tablet 1   No facility-administered medications prior to visit.     PAST MEDICAL HISTORY: Past Medical History:  Diagnosis Date   Anemia sept /oct 2014   Arthritis    Asthma    Constipation    Diverticulitis    Dyspnea    with activity   Environmental allergies    Frequency of urination    Gastroenteritis    GERD (gastroesophageal reflux disease)    Glaucoma    Hemorrhoids    external with some bleeding   Hyperlipidemia    Hypothyroidism    Neuropathy    Obesity    OSA (obstructive sleep apnea)    wears CPAP night pt does not know settings   Urgency of urination      PAST SURGICAL HISTORY: Past Surgical History:  Procedure Laterality Date   BREAST BIOPSY Left 12/2016   fibroadenoma   BREAST REDUCTION SURGERY Bilateral 12/16/2013   Procedure: BILATERAL BREAST REDUCTION;  Surgeon: Theodoro Kos, DO;  Location: [facility];  Service: Plastics;  Laterality: Bilateral;   BREAST REDUCTION SURGERY Bilateral    CARPAL TUNNEL RELEASE     right   CARPAL TUNNEL RELEASE Right    CARPAL TUNNEL RELEASE Left 10/10/2018   Procedure: LEFT CARPAL TUNNEL RELEASE;  Surgeon: Mcarthur Rossetti, MD;  Location: [facility];  Service: Orthopedics;  Laterality: Left;   CHOLECYSTECTOMY  2008   CHOLECYSTECTOMY     COLONOSCOPY     COLONOSCOPY WITH PROPOFOL N/A 02/24/2013   Procedure: COLONOSCOPY WITH PROPOFOL;  Surgeon: Ladene Artist, MD;  [facility];  Service: Endoscopy;  Laterality: N/A;   KNEE ARTHROSCOPY  03/18/2012   Procedure: ARTHROSCOPY KNEE;  Surgeon: Mcarthur Rossetti, MD;  Location: [place] [facility];  Service: Orthopedics;  Laterality: Right;  Right knee arthroscopy with debridement and three compartment  chondroplasty   polyps removal  2007   REDUCTION MAMMAPLASTY     TUBAL LIGATION  1992     FAMILY HISTORY: Family History  Problem Relation Age of Onset   Heart murmur Mother        rheumatoid arthritis   Heart attack Mother    COPD Mother    Diabetes Mother    Breast cancer Mother    Prostate cancer Father    Kidney failure Father    Asthma Brother        and mother both as a child     SOCIAL HISTORY: Social History   Socioeconomic History   Marital status: Married    Spouse name: Not on file   Number of children: 2   Years of education: Not on file   Highest education level: Not on file  Occupational History   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  Tobacco Use   Smoking status: Former    Packs/day: 2.00    Years: 7.00    Pack  years: 14.00    Types: Cigarettes    Quit date: 02/12/1990    Years since quitting: 31.0   Smokeless tobacco: Never  Vaping Use   Vaping Use: Never used  Substance and Sexual Activity   Alcohol use: Not Currently    Comment: rare   Drug use: No   Sexual activity: Not Currently  Other Topics Concern   Not on file  Social History Narrative   Not on file   Social Determinants of Health   Financial Resource Strain: Not on file  Food Insecurity: Not on file  Transportation Needs: Not on file  Physical Activity: Not on file  Stress: Not on file  Social Connections: Not on file  Intimate Partner Violence: Not on file     PHYSICAL EXAM  Vitals:   02/21/21 1408  BP: 119/67  Pulse: 98  Weight: (!) 366 lb 8 oz (166.2 kg)  Height: 5\' 6"  (1.676 m)   Body mass index is 59.15 kg/m.  Generalized: Well developed, in no acute distress  Cardiology: normal rate and rhythm, no murmur auscultated  Respiratory: clear to auscultation bilaterally    Neurological examination  Mentation: Alert oriented to time, place, history taking. Follows all commands speech and language  fluent Cranial nerve II-XII: Pupils were equal round reactive to light. Extraocular movements were full, visual field were full on confrontational test. Facial sensation and strength were normal. Uvula tongue midline. Head turning and shoulder shrug  were normal and symmetric. Motor: The motor testing reveals 5 over 5 strength of all 4 extremities. Good symmetric motor tone is noted throughout.  Sensory: Sensory testing is intact to soft touch on all 4 extremities. Increased sensitivity to right lateral thigh  Gait and station: Gait is wide, arthritic, stable with cane   DIAGNOSTIC DATA (LABS, IMAGING, TESTING) - I reviewed patient records, labs, notes, testing and imaging myself where available.  Lab Results  Component Value Date   WBC 7.3 01/28/2019   HGB 12.8 01/28/2019   HCT 41.5 01/28/2019   MCV 68.0 (L) 01/28/2019   PLT 409 (H) 01/28/2019      Component Value Date/Time   NA 140 01/28/2019 1628   K 3.4 (L) 01/28/2019 1628   CL 105 01/28/2019 1628   CO2 22 01/28/2019 1628   GLUCOSE 104 (H) 01/28/2019 1628   BUN 6 01/28/2019 1628   CREATININE 0.65 01/28/2019 1628   CALCIUM 9.8 01/28/2019 1628   PROT 8.0 01/28/2019 1628   ALBUMIN 4.1 01/28/2019 1628   AST 24 01/28/2019 1628   ALT 26 01/28/2019 1628   ALKPHOS 76 01/28/2019 1628   BILITOT 0.4 01/28/2019 1628   GFRNONAA >60 01/28/2019 1628   GFRAA >60 01/28/2019 1628   Lab Results  Component Value Date   CHOL 209 (H) 09/22/2009   HDL 62 09/22/2009   LDLCALC 116 (H) 09/22/2009   TRIG 156 (H) 09/22/2009   CHOLHDL 3.4 Ratio 09/22/2009   Lab Results  Component Value Date   HGBA1C 5.7 (H) 12/16/2013   No results found for: [id number] Lab Results  Component Value Date   TSH 6.759 (H) 01/11/2009    No flowsheet data found.   No flowsheet data found.   ASSESSMENT AND PLAN  56 y.o. year old female  has a past medical history of Anemia (sept /oct 2014), Arthritis, Asthma, Constipation, Diverticulitis, Dyspnea,  Environmental allergies, Frequency of urination, Gastroenteritis, GERD (gastroesophageal reflux disease), Glaucoma, Hemorrhoids, Hyperlipidemia, Hypothyroidism, Neuropathy, Obesity, OSA (obstructive sleep apnea), and Urgency of urination.  here with    Meralgia paresthetica of right side  REM behavioral disorder  Excessive daytime sleepiness  OSA on CPAP  Geralyn is doing well, today. No worsening symptoms. Right leg pain has improved. She will continue current treatment plan. I will have her try melatonin 10mg  daily in addition to clonazepam for RBD. She may consider 10mg  IR and 2-5mg  XR nightly if well tolerated. She will continue CPAP as directed by pulmonology. She will continue healthy lifestyle habits. Follow up in 4-6 months, sooner if needed.    No orders of the defined types were placed in this encounter.    Meds ordered this encounter  Medications   gabapentin (NEURONTIN) 800 MG tablet    Sig: 1 tab 4 times daily    Dispense:  360 tablet    Refill:  3    Requesting 1 year supply    Order Specific Question:   Supervising Provider    Answer:   Melvenia Beam [[id number]]   clonazePAM (KLONOPIN) 1 MG tablet    Sig: Take 1 tablet (1 mg total) by mouth at bedtime.    Dispense:  90 tablet    Refill:  1    Order Specific Question:   Supervising Provider    Answer:   Melvenia Beam [[id number]]   modafinil (PROVIGIL) 200 MG tablet    Sig: Take 1 tablet (200 mg total) by mouth daily.    Dispense:  90 tablet    Refill:  1    Order Specific Question:   Supervising Provider    Answer:   Melvenia Beam [id number]   Oxcarbazepine (TRILEPTAL) 300 MG tablet    Sig: Take 2 tablets (600 mg total) by mouth 2 (two) times daily.    Dispense:  360 tablet    Refill:  3    Order Specific Question:   Supervising Provider    Answer:   Melvenia Beam [[id number]]   lidocaine (XYLOCAINE) 5 % ointment    Sig: Apply to right thigh twice a day    Dispense:  100 g    Refill:  3    Order  Specific Question:   Supervising Provider    Answer:   Melvenia Beam [[id number]]      BWG YKZLD, MSN, FNP-C 02/21/2021, 2:42 PM  [facility] [address] [place], [place] [address] [phone]

## 2021-02-21 NOTE — Patient Instructions (Addendum)
Below is our plan:  We will continue oxcarbazepine, gabapentin, lidocaine, clonazepam and modafinil as prescribed. We have updated refills.   I want you to try melatonin 10mg  daily at bedtime. You can get this over the counter. I like the dissolvable tablets that you can leave at your bedtime. You can consider adding extended release melatonin if you wish. I recommend 2-5mg  of extended release melatonin.   Please make sure you are staying well hydrated. I recommend 50-60 ounces daily. Well balanced diet and regular exercise encouraged. Consistent sleep schedule with 6-8 hours recommended.   Please continue follow up with care team as directed.   Follow up in 6 months   You may receive a survey regarding today's visit. I encourage you to leave honest feed back as I do use this information to improve patient care. Thank you for seeing me today!

## 2021-02-22 ENCOUNTER — Telehealth: Payer: Self-pay | Admitting: *Deleted

## 2021-02-22 NOTE — Telephone Encounter (Signed)
Request Reference Number: [id number]. MODAFINIL TAB 200MG  is approved through 08/22/2021. Your patient may now fill this prescription and it will be covered.

## 2021-02-22 NOTE — Telephone Encounter (Signed)
Submitted PA modafinil on CMM. Key: [id number]. Waiting on determination from [employer] Part D.

## 2021-04-12 ENCOUNTER — Encounter: Payer: Self-pay | Admitting: Orthopaedic Surgery

## 2021-04-12 ENCOUNTER — Ambulatory Visit (INDEPENDENT_AMBULATORY_CARE_PROVIDER_SITE_OTHER): Payer: Medicare Other | Admitting: Orthopaedic Surgery

## 2021-04-12 DIAGNOSIS — M25562 Pain in left knee: Secondary | ICD-10-CM | POA: Diagnosis not present

## 2021-04-12 DIAGNOSIS — G8929 Other chronic pain: Secondary | ICD-10-CM

## 2021-04-12 DIAGNOSIS — M25561 Pain in right knee: Secondary | ICD-10-CM

## 2021-04-12 MED ORDER — METHYLPREDNISOLONE ACETATE 40 MG/ML IJ SUSP
40.0000 mg | INTRAMUSCULAR | Status: AC | PRN
  Administered 2021-04-12: 40 mg via INTRA_ARTICULAR

## 2021-04-12 MED ORDER — LIDOCAINE HCL 1 % IJ SOLN
3.0000 mL | INTRAMUSCULAR | Status: AC | PRN
  Administered 2021-04-12: 3 mL

## 2021-04-12 NOTE — Progress Notes (Signed)
? ?Office Visit Note ?  ?Patient: Melissa Rowe           ?Date of Birth: [birth year]           ?MRN: [id number] ?Visit Date: 04/12/2021 ?             ?Requested by: Ginger Organ., MD ?[address] ?[place],  [place] [address] ?PCP: Ginger Organ., MD ? ? ?Assessment & Plan: ?Visit Diagnoses:  ?1. Chronic pain of left knee   ?2. Chronic pain of right knee   ? ? ?Plan: Per the patient's request I did provide a steroid injection in both knees today which she tolerated well.  She will watch her blood glucose closely and continue to work on a weight loss journey.  When she does come in again for steroid injections, I would like a new weight and BMI calculation.  She knows to wait at least 4 months or more between ? ?Follow-Up Instructions: Return if symptoms worsen or fail to improve.  ? ?Orders:  ?Orders Placed This Encounter  ?Procedures  ? Large Joint Inj  ? Large Joint Inj  ? ?No orders of the defined types were placed in this encounter. ? ? ? ? Procedures: ?Large Joint Inj: R knee on 04/12/2021 2:42 PM ?Indications: diagnostic evaluation and pain ?Details: 22 G 1.5 in needle, superolateral approach ? ?Arthrogram: No ? ?Medications: 3 mL lidocaine 1 %; 40 mg methylPREDNISolone acetate 40 MG/ML ?Outcome: tolerated well, no immediate complications ?Procedure, treatment alternatives, risks and benefits explained, specific risks discussed. Consent was given by the patient. Immediately prior to procedure a time out was called to verify the correct patient, procedure, equipment, support staff and site/side marked as required. Patient was prepped and draped in the usual sterile fashion.  ? ? ?Large Joint Inj: L knee on 04/12/2021 2:43 PM ?Indications: diagnostic evaluation and pain ?Details: 22 G 1.5 in needle, superolateral approach ? ?Arthrogram: No ? ?Medications: 3 mL lidocaine 1 %; 40 mg methylPREDNISolone acetate 40 MG/ML ?Outcome: tolerated well, no immediate complications ?Procedure, treatment alternatives,  risks and benefits explained, specific risks discussed. Consent was given by the patient. Immediately prior to procedure a time out was called to verify the correct patient, procedure, equipment, support staff and site/side marked as required. Patient was prepped and draped in the usual sterile fashion.  ? ? ? ? ?Clinical Data: ?No additional findings. ? ? ?Subjective: ?Chief Complaint  ?Patient presents with  ? Left Knee - Follow-up  ? Right Knee - Follow-up  ?The patient comes in today requesting steroid injections in both her knees to treat the pain from osteoarthritis.  She is someone who is morbidly obese but is on a weight loss journey.  She last had steroid injections in both knees 7 months ago.  She has had no other acute change in her medical status and she is working diligently on weight loss.  She also does offload her knees ambulate with a cane. ? ?HPI ? ?Review of Systems ?There is currently listed no fever, chills, nausea, vomiting ? ?Objective: ?Vital Signs: LMP 04/06/2015  ? ?Physical Exam ?She is alert and oriented x3 and in no acute distress ?Ortho Exam ?Examination of both knees shows slight varus malalignment with patellofemoral crepitation and good range of motion both knees.  Both knees are globally tender with medial and lateral joint line tenderness and posterior tenderness. ?Specialty Comments:  ?No specialty comments available. ? ?Imaging: ?No results found. ? ? ?PMFS History: ?  Patient Active Problem List  ? Diagnosis Date Noted  ? Morbid obesity due to excess calories ([place]) 10/24/2020  ? Left carpal tunnel syndrome 09/23/2018  ? Arm numbness left 06/16/2018  ? Excessive daytime sleepiness 12/16/2017  ? Unilateral primary osteoarthritis, left knee 08/14/2016  ? Unilateral primary osteoarthritis, right knee 08/14/2016  ? Chronic pain of both knees 08/14/2016  ? REM behavioral disorder 08/14/2016  ? Primary osteoarthritis of both knees 06/14/2016  ? Allodynia 03/26/2014  ? Meralgia  paresthetica of right side 02/26/2014  ? Degeneration of lumbar or lumbosacral intervertebral disc 02/26/2014  ? Symptomatic mammary hypertrophy 12/16/2013  ? Acute bronchitis 04/02/2013  ? Flu-like symptoms 04/02/2013  ? Blood in stool 02/24/2013  ? Iron deficiency anemia, unspecified 02/24/2013  ? Asthma with acute exacerbation 12/28/2012  ? Post-nasal drainage 09/02/2012  ? Arthritis of knee, right 03/18/2012  ? Preoperative respiratory examination 03/14/2012  ? Preop respiratory exam 02/28/2012  ? Acute URI 02/12/2012  ? Vitamin D deficiency 09/21/2010  ? MORBID OBESITY 10/14/2008  ? OSA on CPAP 10/14/2008  ? Moderate persistent asthma 06/02/2008  ? ?Past Medical History:  ?Diagnosis Date  ? Anemia sept /oct 2014  ? Arthritis   ? Asthma   ? Constipation   ? Diverticulitis   ? Dyspnea   ? with activity  ? Environmental allergies   ? Frequency of urination   ? Gastroenteritis   ? GERD (gastroesophageal reflux disease)   ? Glaucoma   ? Hemorrhoids   ? external with some bleeding  ? Hyperlipidemia   ? Hypothyroidism   ? Neuropathy   ? Obesity   ? OSA (obstructive sleep apnea)   ? wears CPAP night pt does not know settings  ? Urgency of urination   ?  ?Family History  ?Problem Relation Age of Onset  ? Heart murmur Mother   ?     rheumatoid arthritis  ? Heart attack Mother   ? COPD Mother   ? Diabetes Mother   ? Breast cancer Mother   ? Prostate cancer Father   ? Kidney failure Father   ? Asthma Brother   ?     and mother both as a child  ?  ?Past Surgical History:  ?Procedure Laterality Date  ? BREAST BIOPSY Left 12/2016  ? fibroadenoma  ? BREAST REDUCTION SURGERY Bilateral 12/16/2013  ? Procedure: BILATERAL BREAST REDUCTION;  Surgeon: Theodoro Kos, DO;  Location: [facility];  Service: Plastics;  Laterality: Bilateral;  ? BREAST REDUCTION SURGERY Bilateral   ? CARPAL TUNNEL RELEASE    ? right  ? CARPAL TUNNEL RELEASE Right   ? CARPAL TUNNEL RELEASE Left 10/10/2018  ? Procedure: LEFT CARPAL TUNNEL RELEASE;  Surgeon:  Mcarthur Rossetti, MD;  [facility];  Service: Orthopedics;  Laterality: Left;  ? CHOLECYSTECTOMY  2008  ? CHOLECYSTECTOMY    ? COLONOSCOPY    ? COLONOSCOPY WITH PROPOFOL N/A 02/24/2013  ? Procedure: COLONOSCOPY WITH PROPOFOL;  Surgeon: Ladene Artist, MD;  [facility];  Service: Endoscopy;  Laterality: N/A;  ? KNEE ARTHROSCOPY  03/18/2012  ? Procedure: ARTHROSCOPY KNEE;  Surgeon: Mcarthur Rossetti, MD;  Location: [facility];  Service: Orthopedics;  Laterality: Right;  Right knee arthroscopy with debridement and three compartment chondroplasty  ? polyps removal  2007  ? REDUCTION MAMMAPLASTY    ? TUBAL LIGATION  1992  ? ?Social History  ? ?Occupational History  ? 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  Employer: UNEMPLOYED  ?Tobacco Use  ? Smoking status: Former  ?  Packs/day: 2.00  ?  Years: 7.00  ?  Pack years: 14.00  ?  Types: Cigarettes  ?  Quit date: 02/12/1990  ?  Years since quitting: 31.1  ? Smokeless tobacco: Never  ?Vaping Use  ? Vaping Use: Never used  ?Substance and Sexual Activity  ? Alcohol use: Not Currently  ?  Comment: rare  ? Drug use: No  ? Sexual activity: Not Currently  ? ? ? ? ? ? ?

## 2021-04-18 ENCOUNTER — Ambulatory Visit: Payer: Medicare Other | Admitting: Internal Medicine

## 2021-05-04 ENCOUNTER — Telehealth: Payer: Self-pay | Admitting: Internal Medicine

## 2021-05-04 MED ORDER — MOLNUPIRAVIR EUA 200MG CAPSULE: 4.0000 | ORAL_CAPSULE | Freq: Two times a day (BID) | ORAL | 0 refills | Status: AC

## 2021-05-04 NOTE — Telephone Encounter (Signed)
Called and spoke with patient. She stated that she tested herself again and it came back positive. She is aware that [facility] wants [place] to send in the molnupiravir for her. She verbalized understanding on the instructions.  ? ?RX has been sent.  ? ?Nothing further needed at time of call.  ?

## 2021-05-04 NOTE — Telephone Encounter (Signed)
Called and spoke with patient. She stated that her husband tested positive for COVID last night. She tested herself last night but the test was negative.  ? ?She woke up this morning with a severe cough. She could talk on the phone from coughing so much. Cough has been productive with green phlegm. She denied any increased SOB. Her chest is starting to hurt on the right side from all of the coughing she has been doing. Denied any fevers. She does have a sore throat.  ? ?Since her symptoms have increased, she will retest herself again.  ? ?She wanted to know if Dr. Annamaria Boots had any recommendations for her.  ? ?Pharmacy is [facility] on [address].  ? ?Dr. Annamaria Boots, can you please advise? Thanks!  ?

## 2021-05-04 NOTE — Telephone Encounter (Signed)
This will almost certainly be Covid. ?Offer molnupiravir 5 days ?

## 2021-05-12 ENCOUNTER — Telehealth: Payer: Self-pay | Admitting: Internal Medicine

## 2021-05-12 NOTE — Telephone Encounter (Signed)
Called and spoke with pt letting her know that since she did test positive 3/23, she should take the Rx until it is fully finished. Pt verbalized understanding. Nothing further needed. ?

## 2021-05-18 ENCOUNTER — Ambulatory Visit: Payer: Medicare Other | Admitting: Internal Medicine

## 2021-06-08 ENCOUNTER — Encounter: Payer: Self-pay | Admitting: Neurology

## 2021-06-08 ENCOUNTER — Ambulatory Visit: Payer: Medicare Other | Admitting: Neurology

## 2021-06-08 VITALS — BP 143/82 | HR 103 | Ht 66.0 in | Wt 357.0 lb

## 2021-06-08 DIAGNOSIS — G5711 Meralgia paresthetica, right lower limb: Secondary | ICD-10-CM

## 2021-06-08 DIAGNOSIS — G4719 Other hypersomnia: Secondary | ICD-10-CM

## 2021-06-08 DIAGNOSIS — G4752 REM sleep behavior disorder: Secondary | ICD-10-CM | POA: Diagnosis not present

## 2021-06-08 DIAGNOSIS — Z9989 Dependence on other enabling machines and devices: Secondary | ICD-10-CM

## 2021-06-08 DIAGNOSIS — G4733 Obstructive sleep apnea (adult) (pediatric): Secondary | ICD-10-CM

## 2021-06-08 NOTE — Progress Notes (Signed)
? ?[facility] ? ?PATIENT: Melissa Rowe ?DOB: Apr 28, 1965  ? ?REFERRING CLINICIAN: Dr. [name] ?HISTORY FROM: Patient  ?REASON FOR VISIT: Lateral thigh pain ? ? ?HISTORICAL ? ?CHIEF COMPLAINT:  ?Chief Complaint  ?Patient presents with  ? Follow-up  ?  RM 16, alone.  Last seen 02/21/21. Ambulates with cane. Pulmonology still managing CPAP. Sees them on 06/12/21. She had covid 05/02/21.   ? ? ?HISTORY OF PRESENT ILLNESS:  ?Melissa Rowe is a 56 y.o. woman with right anterolateral thigh numbness and pain due to a Lateral femoral cutaneous neuropathy and a sleep disorder.    ? ?Update  06/08/2021 ?She had Covid-19 around  May 01, 2021 and had worsening  shortness of breath and DOE.      She had all her vaccinations.   She also has COPD and is on CPAP for OSA.    She is feeling better but not to baseline.   She is still coughing. ? ?Her numbness and mild dysesthesias in the distribution of the right LFCN in the anterolateral thigh is better this year than last year.  The medications help a lot.  ? ?She had a left CTR surgery for CTS and numbness is much better though she has mild weakness.    She had right CTR many years ago.        ? ?She has active dreams still some nights.   She has woken up screaming a few times with bad dreams and has swung at her husband while asleep.    She did much better on clonazepam 1 mg (only 1-2 times a month and they were milder)   She still talks in her sleep but the more active dreams are much more rare.   She has OSA and uses CPAP.   Her EDS is better with modafinil and now one short nap a day keeps her in good shape.     She has no cataplexy.    ? ?EPWORTH SLEEPINESS SCALE (On Modafinil) ? ?On a scale of 0 - 3 what is the chance of dozing: ? ?Sitting and Reading:   1 ?Watching TV:    1 ?Sitting inactive in a public place: 1 ?Passenger in car for one hour: 0 ?Lying down to rest in the afternoon: 3 ?Sitting and talking to someone: 1 ?Sitting quietly after  lunch:  0 ?In a car, stopped in traffic:   Does not drive  ? ?Total (out of 24):      7/24    (was 16/24 before modafinil) ?    ? ? ?LFCN History:   She had breast reduction surgery on November 4th and she reports that the right thigh was 'going to sleep' with numbness and tingling later that night.    Over the next week it got more painful and by December, the pain was very severe.  Pain was apparently so bad that she was screaming out in the middle of the night. She describes a gnawing pain that is constant but also has a stabbing pain frequently.   She was worked in at her Primary care provider?s office, Dr. [name], and was diagnosed with right meralgia paresthetica.   Lyrica was started.  She saw neurosurgery and the Lyrica dose was increased from 75  to 150 mg twice a day with only minimal relief. She returned to her primary care provider?s office on 02/16/2014 and tramadol was added.    Of note, she gained about 40 pounds during the 4 weeks between  her two primary care visits.   On the higher dose of Lyrica, the stabbing pain was helped but the more annoying constant pain was not. She was switched from Lyrica to gabapentin with benefit (02/2014).      Lamotrigine was not tolerated (added 03/2014).   Lidocaine ointment helps some (added 2/20165).    Oxcabazepine added 09/2014 with improvement, dose slwowly titrated to 600 mg po bid.  around May 2017 she had a procedure at [facility] with improvement of the anterior half of the painful region but not the lateral half..      ? ? ?REVIEW OF SYSTEMS:  ?Constitutional: No fevers, chills, sweats, or change in appetite ?Eyes: No visual changes, double vision, eye pain ?Ear, nose and throat: No hearing loss, ear pain, nasal congestion, sore throat ?Cardiovascular: No chest pain, palpitations ?Respiratory:  No shortness of breath at rest or with exertion.   No wheezes.    She has OSA and is on CPAP. ?GastrointestinaI: No nausea, vomiting, diarrhea, abdominal  pain, fecal incontinence ?Genitourinary:  No dysuria, urinary retention or frequency.  No nocturia. ?Musculoskeletal:  No neck pain, some back pain, moderate knee pain   ?Integumentary: No rash, pruritus, skin lesions ?Neurological: as above ? ?ALLERGIES: ?Allergies  ?Allergen Reactions  ? Lamictal [Lamotrigine] Itching  ?  Migraine   ? Metformin And Related   ?  Diarrhea, fatigue  ? Other Rash  ?  Powder inside latex gloves - rash and sores   ? ? ?HOME MEDICATIONS: ?Outpatient Medications Prior to Visit  ?Medication Sig Dispense Refill  ? albuterol (PROAIR HFA) 108 (90 Base) MCG/ACT inhaler Inhale 2 puffs every 6 hours as needed- rescue 54 g 4  ? Calcium Carb-Cholecalciferol (CALCIUM 600 + D PO) Take 1 tablet by mouth daily.    ? clonazePAM (KLONOPIN) 1 MG tablet Take 1 tablet (1 mg total) by mouth at bedtime. 90 tablet 1  ? cyclobenzaprine (FLEXERIL) 10 MG tablet Take 1 tablet (10 mg total) by mouth 2 (two) times daily as needed for muscle spasms. 20 tablet 0  ? gabapentin (NEURONTIN) 800 MG tablet 1 tab 4 times daily 360 tablet 3  ? levothyroxine (SYNTHROID) 300 MCG tablet Take 300 mcg by mouth daily.    ? lidocaine (XYLOCAINE) 5 % ointment Apply to right thigh twice a day 100 g 3  ? modafinil (PROVIGIL) 200 MG tablet Take 1 tablet (200 mg total) by mouth daily. 90 tablet 1  ? mometasone-formoterol (DULERA) 200-5 MCG/ACT AERO USE 2 INHALATIONS BY MOUTH  TWICE DAILY 39 g 3  ? montelukast (SINGULAIR) 10 MG tablet Take 10 mg by mouth at bedtime.     ? MOUNJARO 5 MG/0.5ML Pen Inject 0.5 mLs into the skin once a week.    ? Multiple Vitamin (MULTIVITAMIN) capsule Take 1 capsule by mouth daily.     ? Omeprazole-Sodium Bicarbonate (ZEGERID) 20-1100 MG CAPS capsule Take 1 capsule by mouth daily before breakfast.    ? Oxcarbazepine (TRILEPTAL) 300 MG tablet Take 2 tablets (600 mg total) by mouth 2 (two) times daily. 360 tablet 3  ? oxybutynin (DITROPAN XL) 15 MG 24 hr tablet Take 15 mg by mouth daily.    ? polyethylene  glycol (MIRALAX / GLYCOLAX) packet Take 17 g by mouth daily as needed for moderate constipation.     ? psyllium (METAMUCIL SMOOTH TEXTURE) 28 % packet Take 1 packet by mouth 2 (two) times daily.    ? rosuvastatin (CRESTOR) 10 MG tablet Take 10  mg by mouth at bedtime.    ? traMADol (ULTRAM) 50 MG tablet Take 50 mg by mouth 3 (three) times daily as needed.    ? OZEMPIC, 1 MG/DOSE, 4 MG/3ML SOPN INJECT '1MG'$  ONCE A WEEK    ? ?No facility-administered medications prior to visit.  ? ? ?PAST MEDICAL HISTORY: ?Past Medical History:  ?Diagnosis Date  ? Anemia sept /oct 2014  ? Arthritis   ? Asthma   ? Constipation   ? Diverticulitis   ? Dyspnea   ? with activity  ? Environmental allergies   ? Frequency of urination   ? Gastroenteritis   ? GERD (gastroesophageal reflux disease)   ? Glaucoma   ? Hemorrhoids   ? external with some bleeding  ? Hyperlipidemia   ? Hypothyroidism   ? Neuropathy   ? Obesity   ? OSA (obstructive sleep apnea)   ? wears CPAP night pt does not know settings  ? Urgency of urination   ? ? ?PAST SURGICAL HISTORY: ?Past Surgical History:  ?Procedure Laterality Date  ? BREAST BIOPSY Left 12/2016  ? fibroadenoma  ? BREAST REDUCTION SURGERY Bilateral 12/16/2013  ? Procedure: BILATERAL BREAST REDUCTION;  Surgeon: Theodoro Kos, DO;  Location: [place];  Service: Plastics;  Laterality: Bilateral;  ? BREAST REDUCTION SURGERY Bilateral   ? CARPAL TUNNEL RELEASE    ? right  ? CARPAL TUNNEL RELEASE Right   ? CARPAL TUNNEL RELEASE Left 10/10/2018  ? Procedure: LEFT CARPAL TUNNEL RELEASE;  Surgeon: Mcarthur Rossetti, MD;  Location: [facility];  Service: Orthopedics;  Laterality: Left;  ? CHOLECYSTECTOMY  2008  ? CHOLECYSTECTOMY    ? COLONOSCOPY    ? COLONOSCOPY WITH PROPOFOL N/A 02/24/2013  ? Procedure: COLONOSCOPY WITH PROPOFOL;  Surgeon: Ladene Artist, MD;  Location: [facility];  Service: Endoscopy;  Laterality: N/A;  ? KNEE ARTHROSCOPY  03/18/2012  ? Procedure: ARTHROSCOPY KNEE;  Surgeon: Mcarthur Rossetti, MD;   Location: [place];  Service: Orthopedics;  Laterality: Right;  Right knee arthroscopy with debridement and three compartment chondroplasty  ? polyps removal  2007  ? REDUCTION MAMMAPLASTY    ? TUBAL LIGATION  1992  ? ? ?

## 2021-06-09 NOTE — Progress Notes (Signed)
HPI ?F former smoker followed for Rowe, Melissa Rowe, Melissa by  REM Behavior Disorder (Neurology),  Melissa Rowe, Melissa Rowe, Melissa Rowe, Melissa Rowe, Melissa Rowe, Melissa Rowe, Melissa Rowe, Melissa Rowe, Melissa Rowe,  ?Dyspnea due to Rowe and Melissa Rowe ?-> 05/05/2008: Spirometry suggests restriction. CXR 04/19/2008  Normal. CT 02/10/2008  - Neg for PE. No infiltrates ?-> Methacholine Challenge Test: 06/25/2008: Positive PC20 between 1-4 ? - 10/11/2009: fev1 2L/70% -> start pulmicort ? -11/08/2009: fev1 2.24L/78% and better - > switch to QVAR due to cost ?- May 2012: fev1 2.32L/85% and normal but symptomactic -> change QVAR to dulera sample,  6day pred burst ?- Jul 13, 2010: Fev1 2.5L/94%, FVC 3.21/98% and improved -> continue dulera ?-10/26/2010 FEV1 2.33 L/87%, FVC 2.87 L/88% >cont on dulera  ?- med calendar 10/26/10  ?NPSG 08/19/08- AHI 2.9/hr, REM, desaturation to 92%, body weight 328 lbs. ?HST 07/08/2018- AHI 9.2/ hr, desaturation to 84%, body weight 371 lbs ? ?-------------------------------------------------------------------- ?  ? ?Melissa Rowe, Melissa Rowe, Melissa by REM Behavior Disorder (Neurology),  Melissa Rowe, Melissa Rowe, Melissa Rowe, Melissa Rowe, Melissa Rowe, Melissa Rowe, Melissa Rowe, Melissa Rowe, Melissa Rowe,  ?CPAP 5-20/ [employer]   AirSense 10 AutoSet ?Neurology prescribes clonazepam for Rowe, Melissa for Melissa Rowe. ?-Dulera 200, Neb albuterol, Proair hfa, Singulair,  ?Download-compliance 100%, AHI 2.3/ hr ?Body weight-355 lbs ?Covid vax-1 J&J, 1 Moderna ?-----Pt states no concerns. Says everything is about the same, only takes her a little longer to catch her breath. ?Comfortable with CPAP. Download reviewed.  ?Some wheeze in hot weather. Inhalers adequate. ? ?06/12/21-  55 yoF former smoker (14 pkyrs) followed for Rowe, Melissa Rowe, Melissa by REM Behavior Disorder (Neurology),  Melissa Rowe, Melissa Rowe, Melissa Rowe, Melissa Rowe, Melissa Rowe, Melissa Rowe, Melissa Rowe, Melissa Rowe, Melissa Rowe,  ?Covid vax-1 J&J, 1  Moderna ?CPAP 5-20/ [employer]   AirSense 10 AutoSet ?Neurology prescribes clonazepam for Rowe, Melissa for Melissa Rowe. ?-Dulera 200, Neb albuterol, Proair hfa, Singulair,  ?Download-compliance 100%, AHI 2.3/ hr ?Body weight today-358 lbs ?Patient reports active persistent dry cough since she and her husband had COVID in late March.  She was treated with molnupiravir.  Little benefit from OTC cough syrups.  She has been able to use her CPAP.  Using Melissa Rowe medicines as directed and using her nebulizer machine about twice daily with limited benefit against this cough. ?CXR 10/19/20-  ?IMPRESSION: ?No active cardiopulmonary Rowe. ? ?ROS-see HPI  + = positive ?Constitutional:    weight loss, night sweats, fevers, chills,+ fatigue, lassitude. ?HEENT:    headaches, difficulty swallowing, tooth/dental problems, sore throat,  ?     sneezing, itching, ear ache, nasal congestion, post nasal drip, snoring ?CV:    chest pain, orthopnea, PND, swelling in lower extremities, anasarca,                                  ? dizziness, palpitations ?Resp:  + shortness of breath with exertion or at rest.   ?             productive cough,   +non-productive cough, coughing up of blood.   ?           change in color of mucus.  wheezing.   ?Skin:    rash or lesions. ?GI:  No-   heartburn, indigestion, abdominal pain, nausea, vomiting, diarrhea,  ?               change in bowel habits, loss of appetite ?GU: dysuria, change in color  of urine, no urgency or frequency.   flank pain. ?MS:   joint pain, stiffness, decreased range of motion, back pain. ?Neuro-     nothing unusual ?Psych:  change in mood or affect.  depression or anxiety.   memory loss. ? ?OBJ- Physical Exam ?General- Alert, Oriented, Affect-appropriate, Distress- none acute, + morbidly obese ?Skin- rash-none, lesions- none, excoriation- none ?Lymphadenopathy- none ?Head- atraumatic ?           Eyes- Gross vision intact, PERRLA, conjunctivae and secretions clear ?           Ears- Hearing,  canals-normal ?           Nose- Clear, no-Septal dev, mucus, polyps, erosion, perforation  ?           Throat- Mallampati II-III, mucosa clear , drainage- none, tonsils- atrophic, + teeth ?Neck- flexible , trachea midline, no stridor , thyroid nl, carotid no bruit ?Chest - symmetrical excursion , unlabored ?          Heart/CV- RRR , no murmur , no gallop  , no rub, nl s1 s2 ?                          - JVD- none , edema- none, stasis changes- none, varices- none ?          Lung- clear to P&A, wheeze- none, cough+ paroxysmal , dullness-none, rub- none ?+very talkative without respiratory effort or distress ?          Chest wall-  ?Abd-  ?Br/ Gen/ Rectal- Not done, not indicated ?Extrem- cyanosis- none, clubbing, none, atrophy- none, strength- nl    + cane ?Neuro- grossly intact to observation ? ? ? ?

## 2021-06-12 ENCOUNTER — Encounter: Payer: Self-pay | Admitting: Internal Medicine

## 2021-06-12 ENCOUNTER — Ambulatory Visit (INDEPENDENT_AMBULATORY_CARE_PROVIDER_SITE_OTHER): Payer: Medicare Other | Admitting: Internal Medicine

## 2021-06-12 ENCOUNTER — Ambulatory Visit (INDEPENDENT_AMBULATORY_CARE_PROVIDER_SITE_OTHER): Payer: Medicare Other

## 2021-06-12 VITALS — BP 143/82 | HR 111 | Ht 66.0 in | Wt 358.0 lb

## 2021-06-12 DIAGNOSIS — R0609 Other forms of dyspnea: Secondary | ICD-10-CM

## 2021-06-12 DIAGNOSIS — J4540 Moderate persistent asthma, uncomplicated: Secondary | ICD-10-CM | POA: Diagnosis not present

## 2021-06-12 DIAGNOSIS — Z9989 Dependence on other enabling machines and devices: Secondary | ICD-10-CM | POA: Diagnosis not present

## 2021-06-12 DIAGNOSIS — G4733 Obstructive sleep apnea (adult) (pediatric): Secondary | ICD-10-CM | POA: Diagnosis not present

## 2021-06-12 MED ORDER — HYDROCOD POLI-CHLORPHE POLI ER 10-8 MG/5ML PO SUER
ORAL | 0 refills | Status: DC
Start: 2021-06-12 — End: 2021-06-14

## 2021-06-12 MED ORDER — PREDNISONE 10 MG PO TABS: ORAL_TABLET | ORAL | 0 refills | Status: DC

## 2021-06-12 NOTE — Assessment & Plan Note (Signed)
Tracheal bronchitic cough she says originated with COVID infection. ?Plan-prednisone 8-day taper, Tussionex, chest x-ray ?

## 2021-06-12 NOTE — Patient Instructions (Signed)
Order- CXR    dx chronic cough ? ?Script sent for tussionex cough syrup.   5 ml twice daily if needed. You an use otc cough meds and throat lozenges and sips of liquids in-between ? ?Script sent for prednisone taper. ? ? ?We can continue asthma meds ? ?We can continue CPAP ?

## 2021-06-12 NOTE — Assessment & Plan Note (Signed)
Benefits from CPAP with good compliance and control Plan- continue auto 5-20 

## 2021-06-13 ENCOUNTER — Telehealth: Payer: Self-pay | Admitting: Internal Medicine

## 2021-06-13 NOTE — Telephone Encounter (Signed)
Tussionex was sent for patient yesterday. Per the pharmacist, they do not have Tussionex in stock but they do have Hydromet in stock.  ? ?Dr. Annamaria Boots, please advise if you are ok with her changing to Hydromet and [place] cancelling the Tussionex.  ?

## 2021-06-13 NOTE — Telephone Encounter (Signed)
Pharmacy called stating they ONLY have a 7 day supply of hydromet, and if DR preferred her to consume just the tussinex [facility] on [address] has it in stock but it's only 263m. The Rx for hydromet can be called in because Pt has bad cough. Pt states she will take hydromet because she does not want to travel to get tussinex. Pt requesting new Rx ? ?

## 2021-06-14 MED ORDER — HYDROCODONE BIT-HOMATROP MBR 5-1.5 MG/5ML PO SOLN: 5.0000 mL | Freq: Four times a day (QID) | ORAL | 0 refills | Status: DC | PRN

## 2021-06-14 NOTE — Telephone Encounter (Signed)
Hydromet ordered ?Please dc Tussionex order at drug store ?

## 2021-06-14 NOTE — Telephone Encounter (Signed)
Called and spoke with Melissa Rowe at pharmacy. The Tussionex has been cancelled. They will go ahead and process the Hydromet.  ? ?Nothing further needed at time of call.  ?

## 2021-07-20 ENCOUNTER — Telehealth: Payer: Self-pay | Admitting: Orthopaedic Surgery

## 2021-07-20 NOTE — Telephone Encounter (Signed)
Patient called asked if she can get the gel injections in both of her knees?  The number to contact patient is [phone]

## 2021-08-04 ENCOUNTER — Telehealth: Payer: Self-pay | Admitting: Orthopaedic Surgery

## 2021-08-14 ENCOUNTER — Other Ambulatory Visit: Payer: Self-pay

## 2021-08-14 DIAGNOSIS — M1711 Unilateral primary osteoarthritis, right knee: Secondary | ICD-10-CM

## 2021-08-14 DIAGNOSIS — M1712 Unilateral primary osteoarthritis, left knee: Secondary | ICD-10-CM

## 2021-08-16 ENCOUNTER — Ambulatory Visit: Payer: Medicare Other | Admitting: Physician Assistant

## 2021-08-16 ENCOUNTER — Encounter: Payer: Self-pay | Admitting: Physician Assistant

## 2021-08-16 DIAGNOSIS — M1712 Unilateral primary osteoarthritis, left knee: Secondary | ICD-10-CM | POA: Diagnosis not present

## 2021-08-16 DIAGNOSIS — M170 Bilateral primary osteoarthritis of knee: Secondary | ICD-10-CM

## 2021-08-16 DIAGNOSIS — M1711 Unilateral primary osteoarthritis, right knee: Secondary | ICD-10-CM | POA: Diagnosis not present

## 2021-08-16 MED ORDER — HYLAN G-F 20 48 MG/6ML IX SOSY
48.0000 mg | PREFILLED_SYRINGE | INTRA_ARTICULAR | Status: AC | PRN
  Administered 2021-08-16: 48 mg via INTRA_ARTICULAR

## 2021-08-16 MED ORDER — LIDOCAINE HCL 1 % IJ SOLN
3.0000 mL | INTRAMUSCULAR | Status: AC | PRN
  Administered 2021-08-16: 3 mL

## 2021-08-16 NOTE — Progress Notes (Signed)
   Procedure Note  Patient: Melissa Rowe             Date of Birth: 05-19-1965           MRN: [id number]             Visit Date: 08/16/2021 HPI: Ms. Grieder comes in today for scheduled Synvisc 1 injections both knees.  She states the knees have been bothering her over the last couple months.  No new injury.  Last cortisone injections were given in March.  She has known osteoarthritis of both knees.  She is also someone who is morbidly obese and is working on weight loss.  She uses a cane to ambulate.  Denies any new injury to either knee.  Physical exam: General well-developed well-nourished female no acute distress ambulates with a cane. Bilateral knees: Patellofemoral crepitus but good range of motion both knees.  No abnormal warmth or erythema.  Procedures: Visit Diagnoses:  1. Unilateral primary osteoarthritis, right knee   2. Unilateral primary osteoarthritis, left knee     Large Joint Inj: bilateral knee on 08/16/2021 10:19 AM Indications: pain Details: 22 G 1.5 in needle, superolateral approach  Arthrogram: No  Medications (Right): 3 mL lidocaine 1 %; 48 mg Hylan 48 MG/6ML Medications (Left): 3 mL lidocaine 1 %; 48 mg Hylan 48 MG/6ML Outcome: tolerated well, no immediate complications Procedure, treatment alternatives, risks and benefits explained, specific risks discussed. Consent was given by the patient. Immediately prior to procedure a time out was called to verify the correct patient, procedure, equipment, support staff and site/side marked as required. Patient was prepped and draped in the usual sterile fashion.    Hand: She knows to wait at least 6 months between supplemental injections.  She will follow-up with [place] as needed.  Questions encouraged and answered at length

## 2021-08-23 ENCOUNTER — Other Ambulatory Visit: Payer: Self-pay | Admitting: Neurology

## 2021-08-23 MED ORDER — CLONAZEPAM 1 MG PO TABS: 1.0000 mg | ORAL_TABLET | Freq: Every day | ORAL | 1 refills | Status: DC

## 2021-09-11 ENCOUNTER — Encounter: Payer: Self-pay | Admitting: Internal Medicine

## 2021-09-11 NOTE — Progress Notes (Unsigned)
HPI F former smoker followed for OSA, Asthma, complicated by  REM Behavior Disorder (Neurology),  Anemia, Arthritis, Asthma, Degenerative Disk Disease, EDS, GERD, Glaucoma, Hypothyroid, Morbid Obesity,  Dyspnea due to Obesity and Asthma -> 05/05/2008: Spirometry suggests restriction. CXR 04/19/2008  Normal. CT 02/10/2008  - Neg for PE. No infiltrates -> Methacholine Challenge Test: 06/25/2008: Positive PC20 between 1-4  - 10/11/2009: fev1 2L/70% -> start pulmicort  -11/08/2009: fev1 2.24L/78% and better - > switch to QVAR due to cost - May 2012: fev1 2.32L/85% and normal but symptomactic -> change QVAR to dulera sample,  6day pred burst - Jul 13, 2010: Fev1 2.5L/94%, FVC 3.21/98% and improved -> continue dulera -10/26/2010 FEV1 2.33 L/87%, FVC 2.87 L/88% >cont on dulera  - med calendar 10/26/10  NPSG 08/19/08- AHI 2.9/hr, REM, desaturation to 92%, body weight 328 lbs. HST 07/08/2018- AHI 9.2/ hr, desaturation to 84%, body weight 371 lbs  --------------------------------------------------------------------    06/12/21-  55 yoF former smoker (14 pkyrs) followed for OSA, Asthma, complicated by REM Behavior Disorder (Neurology),  Anemia, Arthritis, Asthma, Degenerative Disk Disease, EDS, GERD, Glaucoma, Hypothyroid, Morbid Obesity,  Covid vax-1 J&J, 1 Moderna CPAP 5-20/ [employer]   AirSense 10 AutoSet Neurology prescribes clonazepam for RBD, modafinil for EDS. -Dulera 200, Neb albuterol, Proair hfa, Singulair,  Download-compliance 100%, AHI 2.3/ hr Body weight today-358 lbs Patient reports active persistent dry cough since she and her husband had COVID in late March.  She was treated with molnupiravir.  Little benefit from OTC cough syrups.  She has been able to use her CPAP.  Using asthma medicines as directed and using her nebulizer machine about twice daily with limited benefit against this cough. CXR 10/19/20-  IMPRESSION: No active cardiopulmonary disease.  09/12/21- 89 yoF former smoker (14 pkyrs) followed  for OSA, Asthma, complicated by REM Behavior Disorder (Neurology),  Anemia, Arthritis, Asthma, Degenerative Disk Disease, EDS, GERD, Glaucoma, Hypothyroid, Morbid Obesity,  Covid vax-1 J&J, 3 Moderna CPAP 5-20/ [employer]   AirSense 10 AutoSet Neurology prescribes clonazepam for RBD, modafinil for EDS. -Dulera 200, Neb albuterol, Proair hfa, Singulair,  Download-compliance 97%, AHI 3/ hr Body weight today-362 lbs -----Follow-up: CPAP and coughing green mucus. Responded to antibiotic from Dr [name] in June. Since then coughed up small amt scant green mucus once, but feels well. Avoiding outdoor heat. Download reviewed. Comfortable with her CPAP.  CXR 06/13/21 FINDINGS: The heart size and mediastinal contours are within normal limits. Both lungs are clear. The visualized skeletal structures are unremarkable. IMPRESSION: No active cardiopulmonary disease.  ROS-see HPI  + = positive Constitutional:    weight loss, night sweats, fevers, chills,+ fatigue, lassitude. HEENT:    headaches, difficulty swallowing, tooth/dental problems, sore throat,       sneezing, itching, ear ache, nasal congestion, post nasal drip, snoring CV:    chest pain, orthopnea, PND, swelling in lower extremities, anasarca,                                   dizziness, palpitations Resp:  + shortness of breath with exertion or at rest.                productive cough,   +non-productive cough, coughing up of blood.              change in color of mucus.  wheezing.   Skin:    rash or lesions. GI:  No-   heartburn, indigestion,  abdominal pain, nausea, vomiting, diarrhea,                 change in bowel habits, loss of appetite GU: dysuria, change in color of urine, no urgency or frequency.   flank pain. MS:   joint pain, stiffness, decreased range of motion, back pain. Neuro-     nothing unusual Psych:  change in mood or affect.  depression or anxiety.   memory loss.  OBJ- Physical Exam General- Alert, Oriented,  Affect-appropriate, Distress- none acute, + morbidly obese Skin- rash-none, lesions- none, excoriation- none Lymphadenopathy- none Head- atraumatic            Eyes- Gross vision intact, PERRLA, conjunctivae and secretions clear            Ears- Hearing, canals-normal            Nose- Clear, no-Septal dev, mucus, polyps, erosion, perforation             Throat- Mallampati II-III, mucosa clear , drainage- none, tonsils- atrophic,  + teeth Neck- flexible , trachea midline, no stridor , thyroid nl, carotid no bruit Chest - symmetrical excursion , unlabored           Heart/CV- RRR , no murmur , no gallop  , no rub, nl s1 s2                           - JVD- none , edema- none, stasis changes- none, varices- none           Lung- clear to P&A, wheeze- none, cough-none, dullness-none, rub- none +very talkative without respiratory effort or distress           Chest wall-  Abd-  Br/ Gen/ Rectal- Not done, not indicated Extrem- cyanosis- none, clubbing, none, atrophy- none, strength- nl    + cane Neuro- grossly intact to observation

## 2021-09-12 ENCOUNTER — Encounter: Payer: Self-pay | Admitting: Internal Medicine

## 2021-09-12 ENCOUNTER — Ambulatory Visit (INDEPENDENT_AMBULATORY_CARE_PROVIDER_SITE_OTHER): Payer: Medicare Other | Admitting: Internal Medicine

## 2021-09-12 DIAGNOSIS — G4733 Obstructive sleep apnea (adult) (pediatric): Secondary | ICD-10-CM

## 2021-09-12 DIAGNOSIS — Z9989 Dependence on other enabling machines and devices: Secondary | ICD-10-CM | POA: Diagnosis not present

## 2021-09-12 DIAGNOSIS — J4540 Moderate persistent asthma, uncomplicated: Secondary | ICD-10-CM | POA: Diagnosis not present

## 2021-09-12 MED ORDER — ALBUTEROL SULFATE HFA 108 (90 BASE) MCG/ACT IN AERS: INHALATION_SPRAY | RESPIRATORY_TRACT | 4 refills | Status: AC

## 2021-09-12 MED ORDER — DULERA 200-5 MCG/ACT IN AERO: INHALATION_SPRAY | RESPIRATORY_TRACT | 3 refills | Status: AC

## 2021-09-12 NOTE — Assessment & Plan Note (Signed)
Bronchitis flare in June. Now clear. Plan- refill Dulera and Proair

## 2021-09-12 NOTE — Assessment & Plan Note (Signed)
Benefits from CPAP with good compliance and control Plan- continue auto 5-20 

## 2021-09-12 NOTE — Patient Instructions (Addendum)
We can continue CPAP auto 5-20  Refill script sent for Proair and Dulera refills  Please call if we can help

## 2021-09-19 ENCOUNTER — Other Ambulatory Visit: Payer: Self-pay

## 2021-09-19 MED ORDER — MODAFINIL 200 MG PO TABS
200.0000 mg | ORAL_TABLET | Freq: Every day | ORAL | 1 refills | Status: DC
Start: 2021-09-19 — End: 2022-06-27

## 2021-09-19 NOTE — Progress Notes (Signed)
Last OV was on 06/08/21.  Next OV is scheduled for 12/14/21.  Last RX was written on 07/17/21 for 30 tabs.   [facility] has been reviewed.

## 2021-09-20 ENCOUNTER — Telehealth: Payer: Self-pay | Admitting: *Deleted

## 2021-09-20 NOTE — Telephone Encounter (Signed)
Request Reference Number: [id number]. MODAFINIL TAB '200MG'$  is approved through 03/23/2022. Your patient may now fill this prescription and it will be covered.

## 2021-09-20 NOTE — Telephone Encounter (Signed)
Submitted PA modafinil on CMM. Key: [id number]. Waiting on determination from [employer] Part D.

## 2021-12-07 NOTE — Progress Notes (Deleted)
No chief complaint on file.    HISTORY OF PRESENT ILLNESS:  12/07/21 ALL:  Melissa Rowe returns for follow up for meralgia paresthetica, RBD and EDS.   Pain is . She continues oxcarb '600mg'$  BID and gabapentin '800mg'$  QID. Lidocaine ointment helps acutely.   Sleep. She continues clonazepam '1mg'$  at bedtime. Melatonin?   Modafinil   02/21/2021 ALL: Melissa Rowe is a 56 y.o. female here today for follow up for meralgia paresthetica, RBD and EDS.   She continues oxcarbazepine '600mg'$  BID, gabapentin '800mg'$  QID and lidocaine ointment for lateral femoral cutaneous neuropathy. She feels that pain is improving. She is getting around better. She is trying to exercise more. She is using a foot peddler for exercise. She has been working on weight loss so she can get knee replacement. She had one fall last year, no falls recently.   She continues clonazepam '1mg'$  QHS for RBD. She continues to have night terrors most every night. She continues CPAP (followed by pulm). Apnea reportedly well managed. Modafinil '200mg'$  QD helps with daytime sleepiness. She can tell a big difference if she doesn't take it.    HISTORY (copied from [name] previous note)  Melissa Rowe is a 56 y.o. woman with right anterolateral thigh numbness and pain due to a Lateral femoral cutaneous neuropathy and a sleep disorder.      Update  08/16/2020 She has numbness and mild dysesthesias in the distribution of the right LFCN in the anterolateral thigh. The medications help a lot.    She had a left CTR surgery for CTS and numbness is much better though she has mild weakness.    She had right CTR many years ago.       She is having active dreams most nights.   She has woken up screaming a few ties with bad dreams. And has swung at her husband while asleep.    She did much better on clonazepam 1 mg (only 1-2 times a month and they were milder)   She sometimes talks in her sleep.   She has OSA and uses CPAP.   Her EDS is better with  modafinil and now one short nap a day keeps her in good shape.     She has no cataplexy.      EPWORTH SLEEPINESS SCALE (On Modafinil)   On a scale of 0 - 3 what is the chance of dozing:   Sitting and Reading:                           1 Watching TV:                                      1 Sitting inactive in a public place:        1 Passenger in car for one hour:           0 Lying down to rest in the afternoon:   3 Sitting and talking to someone:          1 Sitting quietly after lunch:                   0 In a car, stopped in traffic:   Does not drive    Total (out of 24):      7/24    (was 16/24 before modafinil)  LFCN History:   She had breast reduction surgery on November 4th and she reports that the right thigh was 'going to sleep' with numbness and tingling later that night.    Over the next week it got more painful and by December, the pain was very severe.  Pain was apparently so bad that she was screaming out in the middle of the night. She describes a gnawing pain that is constant but also has a stabbing pain frequently.   She was worked in at her Primary care provider's office, Dr. [name], and was diagnosed with right meralgia paresthetica.   Lyrica was started.  She saw neurosurgery and the Lyrica dose was increased from 75  to 150 mg twice a day with only minimal relief. She returned to her primary care provider's office on 02/16/2014 and tramadol was added.    Of note, she gained about 40 pounds during the 4 weeks between her two primary care visits.   On the higher dose of Lyrica, the stabbing pain was helped but the more annoying constant pain was not. She was switched from Lyrica to gabapentin with benefit (02/2014).      Lamotrigine was not tolerated (added 03/2014).   Lidocaine ointment helps some (added 2/20165).    Oxcabazepine added 09/2014 with improvement, dose slwowly titrated to 600 mg po bid.  around May 2017 she had a procedure at [facility] with improvement of  the anterior half of the painful region but not the lateral half.[name]   REVIEW OF SYSTEMS: Out of a complete 14 system review of symptoms, the patient complains only of the following symptoms, chronic leg pain, knee pain, restless sleep, and all other reviewed systems are negative.  ESS: 14   ALLERGIES: Allergies  Allergen Reactions   Lamictal [Lamotrigine] Itching    Migraine    Metformin And Related     Diarrhea, fatigue   Other Rash    Powder inside latex gloves - rash and sores      HOME MEDICATIONS: Outpatient Medications Prior to Visit  Medication Sig Dispense Refill   albuterol (PROAIR HFA) 108 (90 Base) MCG/ACT inhaler Inhale 2 puffs every 6 hours as needed- rescue 54 g 4   Calcium Carb-Cholecalciferol (CALCIUM 600 + D PO) Take 1 tablet by mouth daily.     clonazePAM (KLONOPIN) 1 MG tablet Take 1 tablet (1 mg total) by mouth at bedtime. 90 tablet 1   cyclobenzaprine (FLEXERIL) 10 MG tablet Take 1 tablet (10 mg total) by mouth 2 (two) times daily as needed for muscle spasms. 20 tablet 0   gabapentin (NEURONTIN) 800 MG tablet 1 tab 4 times daily 360 tablet 3   lidocaine (XYLOCAINE) 5 % ointment Apply to right thigh twice a day 100 g 3   modafinil (PROVIGIL) 200 MG tablet Take 1 tablet (200 mg total) by mouth daily. 90 tablet 1   mometasone-formoterol (DULERA) 200-5 MCG/ACT AERO USE 2 INHALATIONS BY MOUTH  TWICE DAILY 39 g 3   montelukast (SINGULAIR) 10 MG tablet Take 10 mg by mouth at bedtime.      Multiple Vitamin (MULTIVITAMIN) capsule Take 1 capsule by mouth daily.      Omeprazole-Sodium Bicarbonate (ZEGERID) 20-1100 MG CAPS capsule Take 1 capsule by mouth daily before breakfast.     Oxcarbazepine (TRILEPTAL) 300 MG tablet Take 2 tablets (600 mg total) by mouth 2 (two) times daily. 360 tablet 3   oxybutynin (DITROPAN XL) 15 MG 24 hr tablet Take 15 mg by mouth  daily.     polyethylene glycol (MIRALAX / GLYCOLAX) packet Take 17 g by mouth daily as needed for moderate  constipation.      psyllium (METAMUCIL SMOOTH TEXTURE) 28 % packet Take 1 packet by mouth 2 (two) times daily.     rosuvastatin (CRESTOR) 10 MG tablet Take 10 mg by mouth at bedtime.     No facility-administered medications prior to visit.     PAST MEDICAL HISTORY: Past Medical History:  Diagnosis Date   Anemia sept /oct 2014   Arthritis    Asthma    Constipation    Diverticulitis    Dyspnea    with activity   Environmental allergies    Frequency of urination    Gastroenteritis    GERD (gastroesophageal reflux disease)    Glaucoma    Hemorrhoids    external with some bleeding   Hyperlipidemia    Hypothyroidism    Neuropathy    Obesity    OSA (obstructive sleep apnea)    wears CPAP night pt does not know settings   Urgency of urination      PAST SURGICAL HISTORY: Past Surgical History:  Procedure Laterality Date   BREAST BIOPSY Left 12/2016   fibroadenoma   BREAST REDUCTION SURGERY Bilateral 12/16/2013   Procedure: BILATERAL BREAST REDUCTION;  Surgeon: Theodoro Kos, DO;  Location: [facility];  Service: Plastics;  Laterality: Bilateral;   BREAST REDUCTION SURGERY Bilateral    CARPAL TUNNEL RELEASE     right   CARPAL TUNNEL RELEASE Right    CARPAL TUNNEL RELEASE Left 10/10/2018   Procedure: LEFT CARPAL TUNNEL RELEASE;  Surgeon: Mcarthur Rossetti, MD;  Location: [facility];  Service: Orthopedics;  Laterality: Left;   CHOLECYSTECTOMY  2008   CHOLECYSTECTOMY     COLONOSCOPY     COLONOSCOPY WITH PROPOFOL N/A 02/24/2013   Procedure: COLONOSCOPY WITH PROPOFOL;  Surgeon: Ladene Artist, MD;  Location: [facility];  Service: Endoscopy;  Laterality: N/A;   KNEE ARTHROSCOPY  03/18/2012   Procedure: ARTHROSCOPY KNEE;  Surgeon: Mcarthur Rossetti, MD;  Location: [facility];  Service: Orthopedics;  Laterality: Right;  Right knee arthroscopy with debridement and three compartment chondroplasty   polyps removal  2007   REDUCTION MAMMAPLASTY     TUBAL LIGATION  1992     FAMILY  HISTORY: Family History  Problem Relation Age of Onset   Heart murmur Mother        rheumatoid arthritis   Heart attack Mother    COPD Mother    Diabetes Mother    Breast cancer Mother    Prostate cancer Father    Kidney failure Father    Asthma Brother        and mother both as a child     SOCIAL HISTORY: Social History   Socioeconomic History   Marital status: Married    Spouse name: Not on file   Number of children: 2   Years of education: Not on file   Highest education level: Not on file  Occupational History   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  Tobacco Use   Smoking status: Former    Packs/day: 2.00    Years: 7.00    Total pack years: 14.00    Types: Cigarettes    Quit date: 02/12/1990    Years since quitting: 31.8   Smokeless tobacco: Never  Vaping Use   Vaping Use: Never used  Substance  and Sexual Activity   Alcohol use: Not Currently    Comment: rare   Drug use: No   Sexual activity: Not Currently  Other Topics Concern   Not on file  Social History Narrative   Not on file   Social Determinants of Health   Financial Resource Strain: Not on file  Food Insecurity: Not on file  Transportation Needs: Not on file  Physical Activity: Not on file  Stress: Not on file  Social Connections: Not on file  Intimate Partner Violence: Not on file     PHYSICAL EXAM  There were no vitals filed for this visit.  There is no height or weight on file to calculate BMI.  Generalized: Well developed, in no acute distress  Cardiology: normal rate and rhythm, no murmur auscultated  Respiratory: clear to auscultation bilaterally    Neurological examination  Mentation: Alert oriented to time, place, history taking. Follows all commands speech and language fluent Cranial nerve II-XII: Pupils were equal round reactive to light. Extraocular movements were full, visual field were full on confrontational  test. Facial sensation and strength were normal. Uvula tongue midline. Head turning and shoulder shrug  were normal and symmetric. Motor: The motor testing reveals 5 over 5 strength of all 4 extremities. Good symmetric motor tone is noted throughout.  Sensory: Sensory testing is intact to soft touch on all 4 extremities. Increased sensitivity to right lateral thigh  Gait and station: Gait is wide, arthritic, stable with cane   DIAGNOSTIC DATA (LABS, IMAGING, TESTING) - I reviewed patient records, labs, notes, testing and imaging myself where available.  Lab Results  Component Value Date   WBC 7.3 01/28/2019   HGB 12.8 01/28/2019   HCT 41.5 01/28/2019   MCV 68.0 (L) 01/28/2019   PLT 409 (H) 01/28/2019      Component Value Date/Time   NA 140 01/28/2019 1628   K 3.4 (L) 01/28/2019 1628   CL 105 01/28/2019 1628   CO2 22 01/28/2019 1628   GLUCOSE 104 (H) 01/28/2019 1628   BUN 6 01/28/2019 1628   CREATININE 0.65 01/28/2019 1628   CALCIUM 9.8 01/28/2019 1628   PROT 8.0 01/28/2019 1628   ALBUMIN 4.1 01/28/2019 1628   AST 24 01/28/2019 1628   ALT 26 01/28/2019 1628   ALKPHOS 76 01/28/2019 1628   BILITOT 0.4 01/28/2019 1628   GFRNONAA >60 01/28/2019 1628   GFRAA >60 01/28/2019 1628   Lab Results  Component Value Date   CHOL 209 (H) 09/22/2009   HDL 62 09/22/2009   LDLCALC 116 (H) 09/22/2009   TRIG 156 (H) 09/22/2009   CHOLHDL 3.4 Ratio 09/22/2009   Lab Results  Component Value Date   HGBA1C 5.7 (H) 12/16/2013   No results found for: "VITAMINB12" Lab Results  Component Value Date   TSH 6.759 (H) 01/11/2009        No data to display               No data to display           ASSESSMENT AND PLAN  56 y.o. year old female  has a past medical history of Anemia (sept /oct 2014), Arthritis, Asthma, Constipation, Diverticulitis, Dyspnea, Environmental allergies, Frequency of urination, Gastroenteritis, GERD (gastroesophageal reflux disease), Glaucoma, Hemorrhoids,  Hyperlipidemia, Hypothyroidism, Neuropathy, Obesity, OSA (obstructive sleep apnea), and Urgency of urination. here with    No diagnosis found.  Melissa Rowe is doing well, today. No worsening symptoms. Right leg pain has improved. She will continue current  treatment plan. I will have her try melatonin '10mg'$  daily in addition to clonazepam for RBD. She may consider '10mg'$  IR and 2-'5mg'$  XR nightly if well tolerated. She will continue CPAP as directed by pulmonology. She will continue healthy lifestyle habits. Follow up in 4-6 months, sooner if needed.    No orders of the defined types were placed in this encounter.    No orders of the defined types were placed in this encounter.     Debbora Presto, MSN, FNP-C 12/07/2021, 11:32 AM  [facility] [address] [place],  [address] [phone]

## 2021-12-07 NOTE — Patient Instructions (Incomplete)

## 2021-12-14 ENCOUNTER — Ambulatory Visit: Payer: Medicare Other | Admitting: Family Medicine

## 2021-12-14 DIAGNOSIS — G4719 Other hypersomnia: Secondary | ICD-10-CM

## 2021-12-14 DIAGNOSIS — G5711 Meralgia paresthetica, right lower limb: Secondary | ICD-10-CM

## 2021-12-14 DIAGNOSIS — G4752 REM sleep behavior disorder: Secondary | ICD-10-CM

## 2021-12-14 NOTE — Patient Instructions (Signed)
Below is our plan:  We will continue current treatment plan. Call [facility] to inquire about follow up. We will send a new referral if needed.   Please make sure you are staying well hydrated. I recommend 50-60 ounces daily. Well balanced diet and regular exercise encouraged. Consistent sleep schedule with 6-8 hours recommended.   Please continue follow up with care team as directed.   Follow up with Dr Felecia Shelling in May 2024  You may receive a survey regarding today's visit. I encourage you to leave honest feed back as I do use this information to improve patient care. Thank you for seeing me today!

## 2021-12-14 NOTE — Progress Notes (Signed)
PATIENT: Melissa Rowe DOB: May 16, 1965  REASON FOR VISIT: follow up HISTORY FROM: patient  Virtual Visit via Telephone Note  I connected with Melissa Rowe on 12/22/21 at 11:15 AM EST by telephone and verified that I am speaking with the correct person using two identifiers.   I discussed the limitations, risks, security and privacy concerns of performing an evaluation and management service by telephone and the availability of in person appointments. I also discussed with the patient that there may be a patient responsible charge related to this service. The patient expressed understanding and agreed to proceed.   History of Present Illness:  12/22/21 Melissa Rowe: Diahn returns for follow up for meralgia paresthetica, RBD and EDS.   Pain is well managed. She continues oxcarb '600mg'$  BID and gabapentin '800mg'$  QID. She feels sharp stabbing pains wax and wane in right thigh. She was seen by Melissa Rowe for injection therapy in 2017 and feels that it has helped significant. She is questioning if she soul return for repeat injection. Lidocaine ointment helps acutely.   Sleep is "great". She no longer has night terrors. She continues clonazepam '1mg'$  at bedtime. She is tolerating it well. She is followed by pulmonology for OSA on CPAP. She reports using it for 8-9 hours every night. She feels modafinil helps with daytime sleepiness.   02/21/2021 Melissa Rowe: Melissa Rowe is a 56 y.o. female here today for follow up for meralgia paresthetica, RBD and EDS.   She continues oxcarbazepine '600mg'$  BID, gabapentin '800mg'$  QID and lidocaine ointment for lateral femoral cutaneous neuropathy. She feels that pain is improving. She is getting around better. She is trying to exercise more. She is using a foot peddler for exercise. She has been working on weight loss so she can get knee replacement. She had one fall last year, no falls recently.   She continues clonazepam '1mg'$  QHS for RBD. She continues to  have night terrors most every night. She continues CPAP (followed by pulm). Apnea reportedly well managed. Modafinil '200mg'$  QD helps with daytime sleepiness. She can tell a big difference if she doesn't take it.    HISTORY (copied from [name] previous note)  Melissa Rowe is a 56 y.o. woman with right anterolateral thigh numbness and pain due to a Lateral femoral cutaneous neuropathy and a sleep disorder.      Update  08/16/2020 She has numbness and mild dysesthesias in the distribution of the right LFCN in the anterolateral thigh. The medications help a lot.    She had a left CTR surgery for CTS and numbness is much better though she has mild weakness.    She had right CTR many years ago.       She is having active dreams most nights.   She has woken up screaming a few ties with bad dreams. And has swung at her husband while asleep.    She did much better on clonazepam 1 mg (only 1-2 times a month and they were milder)   She sometimes talks in her sleep.   She has OSA and uses CPAP.   Her EDS is better with modafinil and now one short nap a day keeps her in good shape.     She has no cataplexy.      EPWORTH SLEEPINESS SCALE (On Modafinil)   On a scale of 0 - 3 what is the chance of dozing:   Sitting and Reading:  1 Watching TV:                                      1 Sitting inactive in a public place:        1 Passenger in car for one hour:           0 Lying down to rest in the afternoon:   3 Sitting and talking to someone:          1 Sitting quietly after lunch:                   0 In a car, stopped in traffic:   Does not drive    Total (out of 24):      7/24    (was 16/24 before modafinil)      LFCN History:   She had breast reduction surgery on November 4th and she reports that the right thigh was 'going to sleep' with numbness and tingling later that night.    Over the next week it got more painful and by December, the pain was very severe.  Pain was  apparently so bad that she was screaming out in the middle of the night. She describes a gnawing pain that is constant but also has a stabbing pain frequently.   She was worked in at her Primary care provider's office, Dr. [name], and was diagnosed with right meralgia paresthetica.   Lyrica was started.  She saw neurosurgery and the Lyrica dose was increased from 75  to 150 mg twice a day with only minimal relief. She returned to her primary care provider's office on 02/16/2014 and tramadol was added.    Of note, she gained about 40 pounds during the 4 weeks between her two primary care visits.   On the higher dose of Lyrica, the stabbing pain was helped but the more annoying constant pain was not. She was switched from Lyrica to gabapentin with benefit (02/2014).      Lamotrigine was not tolerated (added 03/2014).   Lidocaine ointment helps some (added 2/20165).    Oxcabazepine added 09/2014 with improvement, dose slwowly titrated to 600 mg po bid.  around May 2017 she had a procedure at [facility] with improvement of the anterior half of the painful region but not the lateral half.   Observations/Objective:   Mentation: Alert oriented to time, place, history taking. Follows Melissa Rowe commands speech and language fluent   Assessment and Plan:  56 y.o. year old female  has a past medical history of Anemia (sept /oct 2014), Arthritis, Asthma, Constipation, Diverticulitis, Dyspnea, Environmental allergies, Frequency of urination, Gastroenteritis, GERD (gastroesophageal reflux disease), Glaucoma, Hemorrhoids, Hyperlipidemia, Hypothyroidism, Neuropathy, Obesity, OSA (obstructive sleep apnea), and Urgency of urination. here with    ICD-10-CM   1. Meralgia paresthetica of right side  G57.11     2. REM behavioral disorder  G47.52     3. Excessive daytime sleepiness  G47.19     4. OSA on CPAP  G47.33       No orders of the defined types were placed in this encounter.   No orders of the defined  types were placed in this encounter.    Follow Up Instructions:  I discussed the assessment and treatment plan with the patient. The patient was provided an opportunity to ask questions and Melissa Rowe were answered. The patient agreed with the plan and demonstrated  an understanding of the instructions.   The patient was advised to call back or seek an in-person evaluation if the symptoms worsen or if the condition fails to improve as anticipated.  I provided 30 minutes of non-face-to-face time during this encounter. Patient located at their place of residence during [employer]. Provider is in the office.    Debbora Presto, NP

## 2021-12-21 ENCOUNTER — Encounter: Payer: Self-pay | Admitting: *Deleted

## 2021-12-22 ENCOUNTER — Telehealth: Payer: Self-pay | Admitting: Family Medicine

## 2021-12-22 ENCOUNTER — Telehealth (INDEPENDENT_AMBULATORY_CARE_PROVIDER_SITE_OTHER): Payer: Medicare Other | Admitting: Family Medicine

## 2021-12-22 ENCOUNTER — Encounter: Payer: Self-pay | Admitting: Family Medicine

## 2021-12-22 DIAGNOSIS — G5711 Meralgia paresthetica, right lower limb: Secondary | ICD-10-CM | POA: Diagnosis not present

## 2021-12-22 DIAGNOSIS — G4733 Obstructive sleep apnea (adult) (pediatric): Secondary | ICD-10-CM | POA: Diagnosis not present

## 2021-12-22 DIAGNOSIS — G4752 REM sleep behavior disorder: Secondary | ICD-10-CM

## 2021-12-22 DIAGNOSIS — G4719 Other hypersomnia: Secondary | ICD-10-CM

## 2021-12-22 NOTE — Telephone Encounter (Signed)
Pt's internet is down. Sent a message Amy,NP to suggest a telephone visit or reschedule appt. Amy said I can do televisit and reschedule her if she isn't doing well. I will call her. TY

## 2021-12-25 ENCOUNTER — Other Ambulatory Visit: Payer: Self-pay | Admitting: Internal Medicine

## 2021-12-25 DIAGNOSIS — Z1231 Encounter for screening mammogram for malignant neoplasm of breast: Secondary | ICD-10-CM

## 2021-12-27 ENCOUNTER — Ambulatory Visit: Payer: Medicare Other | Admitting: Orthopaedic Surgery

## 2022-02-09 ENCOUNTER — Ambulatory Visit: Payer: Medicare Other

## 2022-02-19 ENCOUNTER — Ambulatory Visit: Payer: Medicare Other

## 2022-03-15 ENCOUNTER — Other Ambulatory Visit: Payer: Self-pay | Admitting: Neurology

## 2022-03-19 NOTE — Telephone Encounter (Addendum)
Follow up scheduled on 06/27/22 Per drug register last filled on 12/05/21 # 90 tablets Rx pending for you to sign

## 2022-03-26 ENCOUNTER — Other Ambulatory Visit: Payer: Self-pay | Admitting: *Deleted

## 2022-03-26 MED ORDER — OXCARBAZEPINE 300 MG PO TABS: 600.0000 mg | ORAL_TABLET | Freq: Two times a day (BID) | ORAL | 1 refills | Status: DC

## 2022-03-26 NOTE — Telephone Encounter (Signed)
Follow up scheduled on 06/27/22

## 2022-04-10 ENCOUNTER — Ambulatory Visit
Admission: RE | Admit: 2022-04-10 | Discharge: 2022-04-10 | Disposition: A | Discharge status: Home / Self Care | Payer: Medicare Other | Source: Ambulatory Visit | Attending: Internal Medicine | Admitting: Internal Medicine

## 2022-04-10 DIAGNOSIS — Z1231 Encounter for screening mammogram for malignant neoplasm of breast: Secondary | ICD-10-CM

## 2022-04-18 ENCOUNTER — Telehealth: Payer: Self-pay | Admitting: Orthopaedic Surgery

## 2022-04-18 NOTE — Telephone Encounter (Signed)
Patient called. She would like gel injections in both knees. Her call back number is [phone]

## 2022-04-19 ENCOUNTER — Encounter: Payer: Self-pay | Admitting: Radiology

## 2022-04-20 NOTE — Telephone Encounter (Signed)
VOB submitted for Durolane, bilateral knee.  

## 2022-04-23 ENCOUNTER — Other Ambulatory Visit: Payer: Self-pay

## 2022-04-23 DIAGNOSIS — M1711 Unilateral primary osteoarthritis, right knee: Secondary | ICD-10-CM

## 2022-04-23 DIAGNOSIS — M1712 Unilateral primary osteoarthritis, left knee: Secondary | ICD-10-CM

## 2022-04-24 ENCOUNTER — Encounter: Payer: Self-pay | Admitting: Physician Assistant

## 2022-04-24 ENCOUNTER — Ambulatory Visit (INDEPENDENT_AMBULATORY_CARE_PROVIDER_SITE_OTHER): Payer: Medicare Other | Admitting: Physician Assistant

## 2022-04-24 ENCOUNTER — Ambulatory Visit (INDEPENDENT_AMBULATORY_CARE_PROVIDER_SITE_OTHER): Payer: Medicare Other

## 2022-04-24 DIAGNOSIS — M1712 Unilateral primary osteoarthritis, left knee: Secondary | ICD-10-CM

## 2022-04-24 DIAGNOSIS — M170 Bilateral primary osteoarthritis of knee: Secondary | ICD-10-CM

## 2022-04-24 DIAGNOSIS — M1711 Unilateral primary osteoarthritis, right knee: Secondary | ICD-10-CM

## 2022-04-24 MED ORDER — SODIUM HYALURONATE 60 MG/3ML IX PRSY
60.0000 mg | PREFILLED_SYRINGE | INTRA_ARTICULAR | Status: AC | PRN
  Administered 2022-04-24: 60 mg via INTRA_ARTICULAR

## 2022-04-24 NOTE — Progress Notes (Signed)
   Procedure Note  Patient: Melissa Rowe             Date of Birth: 15-Aug-1965           MRN: [id number]             Visit Date: 04/24/2022 HPI: Melissa Rowe comes in today for scheduled Durolane injections both knees.  She states she has had 2 falls since she was last seen in July and given Synvisc 1 injections in both knees.  She notes that she was in her kitchen and slipped.  This fall occurred in January.  She has had increased knee pain both knees since that time.  She is using a cane to ambulate.  She feels both knees do give way on her at times she is tried ice and rest.  Patient has known osteoarthritis of both knees.  She is someone who is morbidly obese working on weight loss.  She has no scheduled surgery for either knee in the next 6 months.  Physical exam: General: No acute distress mood and affect appropriate ambulates with a cane. Bilateral knees: Good range of motion both knees patellofemoral crepitus bilaterally.  No abnormal warmth erythema or effusion of either knee.  Radiographs: Right knee 2 views: No acute fracture.  Varus deformity with bone-on-bone medial compartment.  Severe patellofemoral arthritic changes.  Mild lateral compartmental changes.  Knee is well located. Left knee: 2 views no acute fractures or acute findings.  Bone-on-bone with varus deformity medial compartment.  Severe patellofemoral arthritic changes mild lateral compartmental changes.  Knee is well located.  Procedures: Visit Diagnoses:  1. Unilateral primary osteoarthritis, left knee   2. Unilateral primary osteoarthritis, right knee     Large Joint Inj: bilateral knee on 04/24/2022 6:13 PM Indications: pain Details: 22 G 1.5 in needle, anterolateral approach  Arthrogram: No  Medications (Right): 60 mg Sodium Hyaluronate 60 MG/3ML Medications (Left): 60 mg Sodium Hyaluronate 60 MG/3ML Outcome: tolerated well, no immediate complications Procedure, treatment alternatives, risks and benefits  explained, specific risks discussed. Consent was given by the patient. Immediately prior to procedure a time out was called to verify the correct patient, procedure, equipment, support staff and site/side marked as required. Patient was prepped and draped in the usual sterile fashion.     Plan: She knows to wait at least 6 months between viscosupplementation injections.  She will follow-up with [place] as needed.  Questions were encouraged and answered at length

## 2022-05-12 IMAGING — DX DG CHEST 2V
2 series · 2 of 2 positions shown · non-contrast
Comparison: 04/29/2015

CLINICAL DATA: Asthma

EXAM:
CHEST - 2 VIEW

[chest pa]
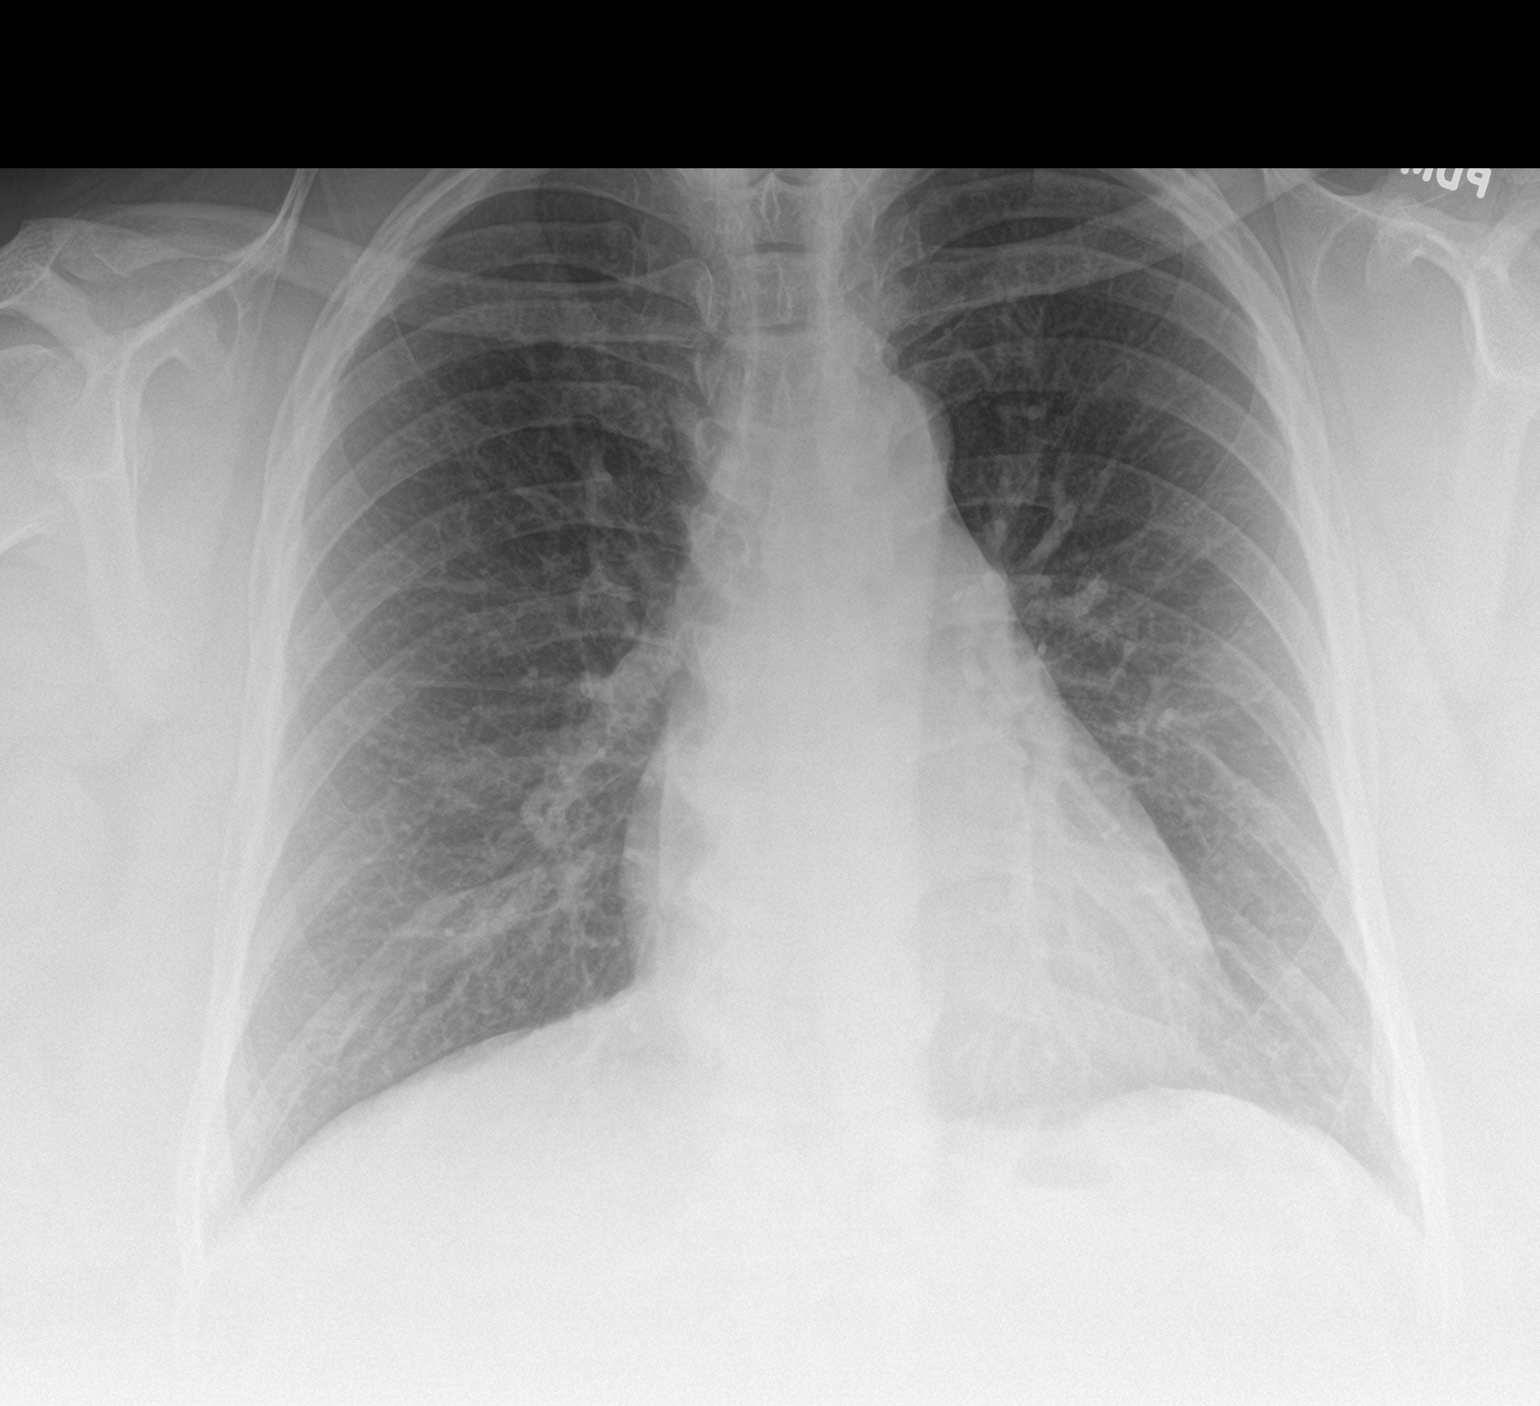

[chest lat]
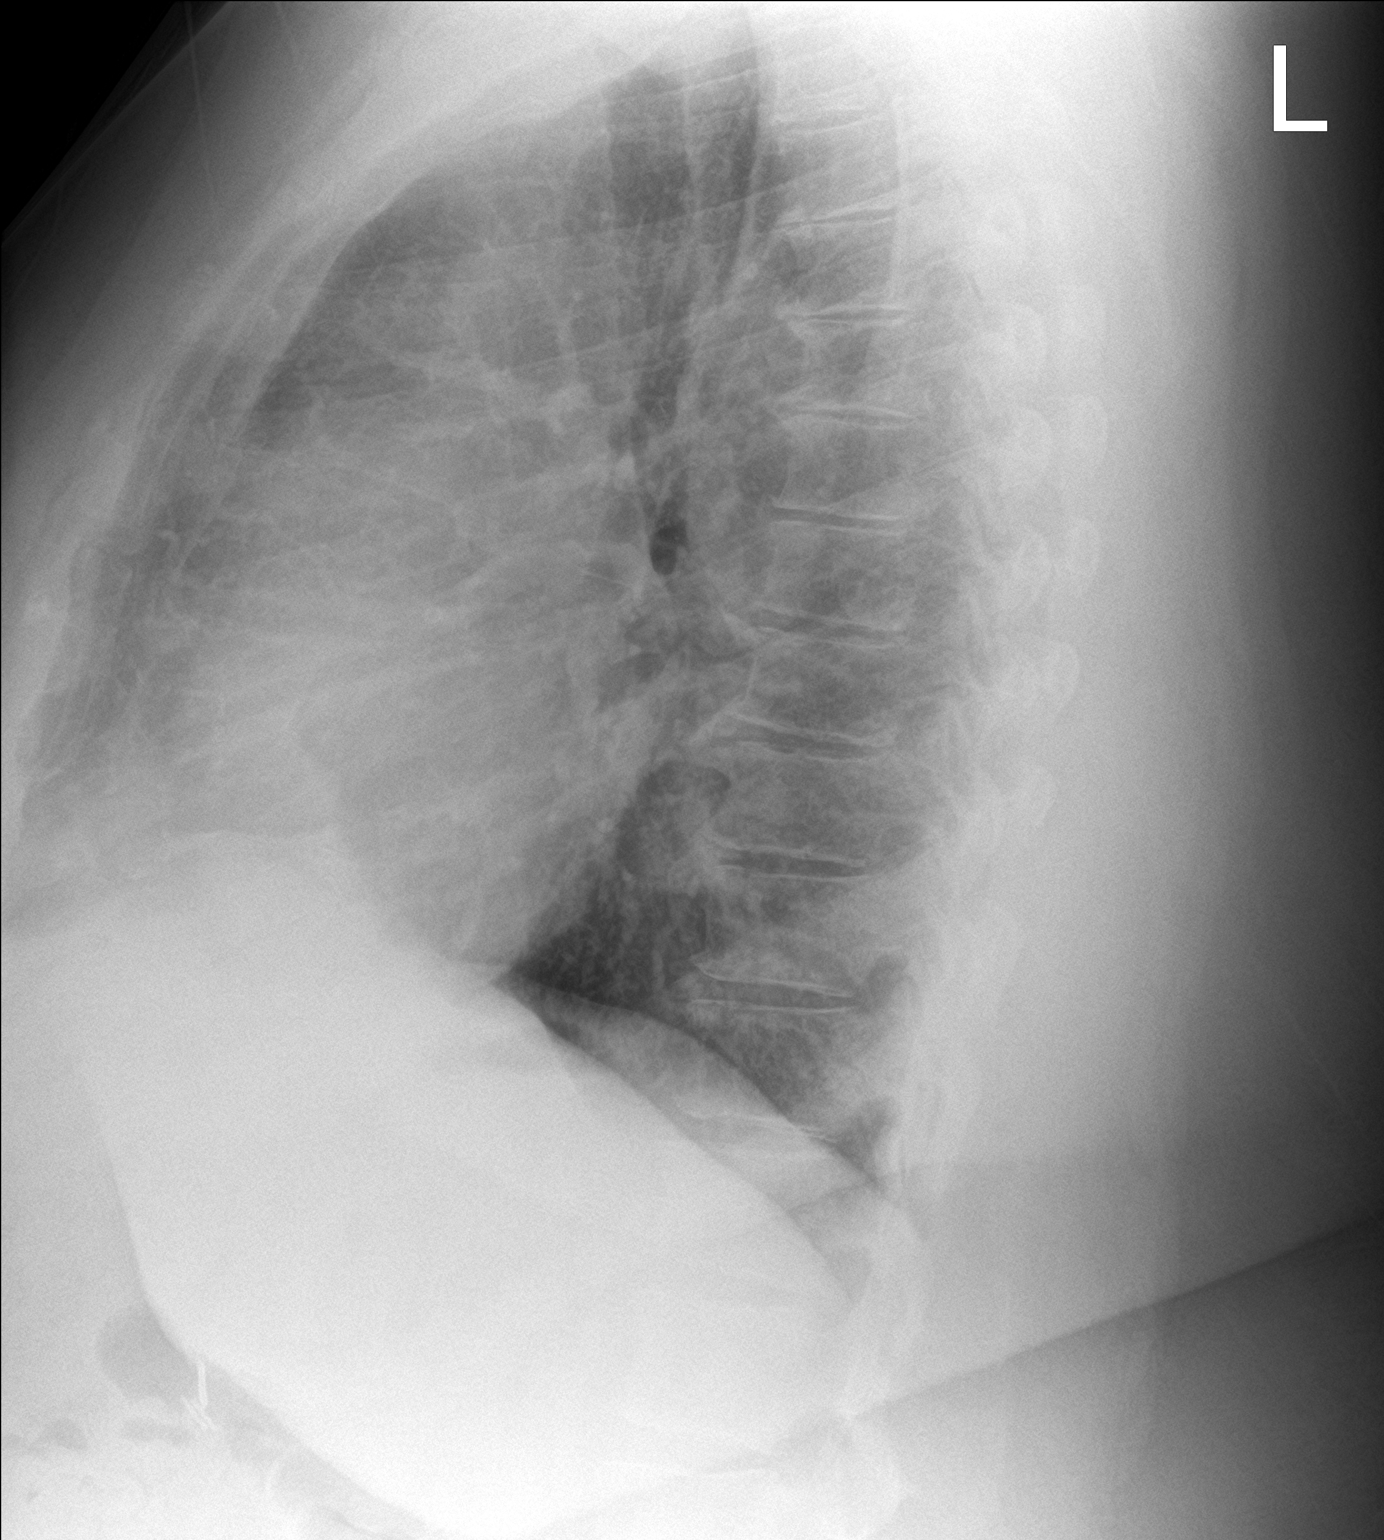

[2 of 2 positions shown; findings below may reference images not displayed]

FINDINGS: Mild bronchitic changes. No focal consolidation or effusion. Normal
heart size. No pneumothorax
IMPRESSION: Mild bronchitic changes. No focal pulmonary infiltrate.

## 2022-06-25 ENCOUNTER — Other Ambulatory Visit: Payer: Self-pay | Admitting: Neurology

## 2022-06-26 NOTE — Telephone Encounter (Signed)
Pt last seen on 12/22/21 per note " Continue clonazepam 1 mg for the REM behavior disorder. "  Follow up scheduled on 06/27/22  Last filled on 03/20/22 #90 tablets (90 day supply)  Rx pending to be signed

## 2022-06-27 ENCOUNTER — Telehealth: Payer: Self-pay | Admitting: Neurology

## 2022-06-27 ENCOUNTER — Ambulatory Visit: Payer: Medicare Other | Admitting: Neurology

## 2022-06-27 ENCOUNTER — Encounter: Payer: Self-pay | Admitting: Neurology

## 2022-06-27 VITALS — BP 136/82 | HR 96 | Ht 66.0 in | Wt 352.0 lb

## 2022-06-27 DIAGNOSIS — G4733 Obstructive sleep apnea (adult) (pediatric): Secondary | ICD-10-CM

## 2022-06-27 DIAGNOSIS — G5711 Meralgia paresthetica, right lower limb: Secondary | ICD-10-CM

## 2022-06-27 DIAGNOSIS — Z79899 Other long term (current) drug therapy: Secondary | ICD-10-CM | POA: Diagnosis not present

## 2022-06-27 DIAGNOSIS — G4752 REM sleep behavior disorder: Secondary | ICD-10-CM

## 2022-06-27 MED ORDER — MODAFINIL 200 MG PO TABS: 200.0000 mg | ORAL_TABLET | Freq: Every day | ORAL | 1 refills | Status: DC

## 2022-06-27 MED ORDER — CLONAZEPAM 1 MG PO TABS: 1.0000 mg | ORAL_TABLET | Freq: Every day | ORAL | 1 refills | Status: DC

## 2022-06-27 MED ORDER — GABAPENTIN 800 MG PO TABS: ORAL_TABLET | ORAL | 3 refills | Status: DC

## 2022-06-27 NOTE — Progress Notes (Signed)
[facility]  PATIENT: Melissa Rowe DOB: 06-24-1965   REFERRING CLINICIAN: Dr. Clelia Croft HISTORY FROM: Patient   REASON FOR VISIT: Lateral thigh pain   HISTORICAL  CHIEF COMPLAINT:  Chief Complaint  Patient presents with   Room 10    Pt is here Alone. Pt states that last night wasn't too good. Pt states that she needs a Refill on Clonazepam and was unable to sleep last night.     HISTORY OF PRESENT ILLNESS:  Melissa Rowe is a 57 y.o. woman with right anterolateral thigh numbness and pain due to a Lateral femoral cutaneous neuropathy and a sleep disorder.     Update  06/27/2022 She has continued numbness and mild dysesthesias in the distribution of the right LFCN in the anterolateral thigh.  The medications help a lot and pain much better than in 2018.  She has allodynia over the same skin patch.  She did a LFCN injection by Pain Management ([facility] in [place]).    She had a benefit from the injection as well.       She still has active dreams still some nights.   She has woken up screaming a few times with bad dreams and has swung at her husband while asleep.    She did much better on clonazepam 1 mg (only 1-2 times a month and they were milder)   She still talks in her sleep but the more active dreams are much more rare.  She was fighting the covers last night and screamed out (ran out of clonazepam).   She has OSA and uses CPAP.   Her EDS is better with modafinil .  She does one short nap a day  and feels better afterwards.     She has no cataplexy.     EPWORTH SLEEPINESS SCALE (On Modafinil)  On a scale of 0 - 3 what is the chance of dozing:  Sitting and Reading:   1 Watching TV:    3 Sitting inactive in a public place: 2 Passenger in car for one hour: 0 Lying down to rest in the afternoon: 3 Sitting and talking to someone: 1 Sitting quietly after lunch:  0 In a car, stopped in traffic:   Does not drive   Total (out of 24):      10/24    mild EDS   (was 16/24 before modafinil but 7/24 last year))       LFCN History:   She had breast reduction surgery on November 4th and she reports that the right thigh was 'going to sleep' with numbness and tingling later that night.    Over the next week it got more painful and by December, the pain was very severe.  Pain was apparently so bad that she was screaming out in the middle of the night. She describes a gnawing pain that is constant but also has a stabbing pain frequently.   She was worked in at her Primary care provider's office, Dr. Clelia Croft, and was diagnosed with right meralgia paresthetica.   Lyrica was started.  She saw neurosurgery and the Lyrica dose was increased from 75  to 150 mg twice a day with only minimal relief. She returned to her primary care provider's office on 02/16/2014 and tramadol was added.    Of note, she gained about 40 pounds during the 4 weeks between her two primary care visits.   On the higher dose of Lyrica, the stabbing pain was helped but the  more annoying constant pain was not. She was switched from Lyrica to gabapentin with benefit (02/2014).      Lamotrigine was not tolerated (added 03/2014).   Lidocaine ointment helps some (added 2/20165).    Oxcabazepine added 09/2014 with improvement, dose slwowly titrated to 600 mg po bid.  around May 2017 she had a procedure at [facility] with improvement of the anterior half of the painful region but not the lateral half.[name]        REVIEW OF SYSTEMS:  Constitutional: No fevers, chills, sweats, or change in appetite Eyes: No visual changes, double vision, eye pain Ear, nose and throat: No hearing loss, ear pain, nasal congestion, sore throat Cardiovascular: No chest pain, palpitations Respiratory:  No shortness of breath at rest or with exertion.   No wheezes.    She has OSA and is on CPAP. GastrointestinaI: No nausea, vomiting, diarrhea, abdominal pain, fecal incontinence Genitourinary:  No dysuria, urinary retention or  frequency.  No nocturia. Musculoskeletal:  No neck pain, some back pain, moderate knee pain   Integumentary: No rash, pruritus, skin lesions Neurological: as above  ALLERGIES: Allergies  Allergen Reactions   Lamictal [Lamotrigine] Itching    Migraine    Metformin And Related     Diarrhea, fatigue   Other Rash    Powder inside latex gloves - rash and sores     HOME MEDICATIONS: Outpatient Medications Prior to Visit  Medication Sig Dispense Refill   albuterol (PROAIR HFA) 108 (90 Base) MCG/ACT inhaler Inhale 2 puffs every 6 hours as needed- rescue 54 g 4   Calcium Carb-Cholecalciferol (CALCIUM 600 + D PO) Take 1 tablet by mouth daily.     cyclobenzaprine (FLEXERIL) 10 MG tablet Take 1 tablet (10 mg total) by mouth 2 (two) times daily as needed for muscle spasms. 20 tablet 0   lidocaine (XYLOCAINE) 5 % ointment Apply to right thigh twice a day 100 g 3   mometasone-formoterol (DULERA) 200-5 MCG/ACT AERO USE 2 INHALATIONS BY MOUTH  TWICE DAILY 39 g 3   montelukast (SINGULAIR) 10 MG tablet Take 10 mg by mouth at bedtime.      Multiple Vitamin (MULTIVITAMIN) capsule Take 1 capsule by mouth daily.      Omeprazole-Sodium Bicarbonate (ZEGERID) 20-1100 MG CAPS capsule Take 1 capsule by mouth daily before breakfast.     Oxcarbazepine (TRILEPTAL) 300 MG tablet Take 2 tablets (600 mg total) by mouth 2 (two) times daily. 360 tablet 1   oxybutynin (DITROPAN XL) 15 MG 24 hr tablet Take 15 mg by mouth daily.     polyethylene glycol (MIRALAX / GLYCOLAX) packet Take 17 g by mouth daily as needed for moderate constipation.      psyllium (METAMUCIL SMOOTH TEXTURE) 28 % packet Take 1 packet by mouth 2 (two) times daily.     rosuvastatin (CRESTOR) 10 MG tablet Take 10 mg by mouth at bedtime.     clonazePAM (KLONOPIN) 1 MG tablet TAKE 1 TABLET BY MOUTH AT BEDTIME 90 tablet 0   gabapentin (NEURONTIN) 800 MG tablet 1 tab 4 times daily 360 tablet 3   modafinil (PROVIGIL) 200 MG tablet Take 1 tablet (200 mg  total) by mouth daily. 90 tablet 1   No facility-administered medications prior to visit.    PAST MEDICAL HISTORY: Past Medical History:  Diagnosis Date   Anemia sept /oct 2014   Arthritis    Asthma    Constipation    Diverticulitis    Dyspnea  with activity   Environmental allergies    Frequency of urination    Gastroenteritis    GERD (gastroesophageal reflux disease)    Glaucoma    Hemorrhoids    external with some bleeding   Hyperlipidemia    Hypothyroidism    Neuropathy    Obesity    OSA (obstructive sleep apnea)    wears CPAP night pt does not know settings   Urgency of urination     PAST SURGICAL HISTORY: Past Surgical History:  Procedure Laterality Date   BREAST BIOPSY Left 12/2016   fibroadenoma   BREAST REDUCTION SURGERY Bilateral 12/16/2013   Procedure: BILATERAL BREAST REDUCTION;  Surgeon: Wayland Denis, DO;  Location: [facility] OR;  Service: Plastics;  Laterality: Bilateral;   BREAST REDUCTION SURGERY Bilateral    CARPAL TUNNEL RELEASE     right   CARPAL TUNNEL RELEASE Right    CARPAL TUNNEL RELEASE Left 10/10/2018   Procedure: LEFT CARPAL TUNNEL RELEASE;  Surgeon: Kathryne Hitch, MD;  Location: [facility] ORS;  Service: Orthopedics;  Laterality: Left;   CHOLECYSTECTOMY  2008   CHOLECYSTECTOMY     COLONOSCOPY     COLONOSCOPY WITH PROPOFOL N/A 02/24/2013   Procedure: COLONOSCOPY WITH PROPOFOL;  Surgeon: Meryl Dare, MD;  Location: [facility] ENDOSCOPY;  Service: Endoscopy;  Laterality: N/A;   KNEE ARTHROSCOPY  03/18/2012   Procedure: ARTHROSCOPY KNEE;  Surgeon: Kathryne Hitch, MD;  Location: [facility] OR;  Service: Orthopedics;  Laterality: Right;  Right knee arthroscopy with debridement and three compartment chondroplasty   polyps removal  2007   REDUCTION MAMMAPLASTY     TUBAL LIGATION  1992    FAMILY HISTORY: Family History  Problem Relation Age of Onset   Heart murmur Mother        rheumatoid arthritis   Heart attack Mother    COPD Mother    Diabetes  Mother    Breast cancer Mother    Prostate cancer Father    Kidney failure Father    Asthma Brother        and mother both as a child    SOCIAL HISTORY:  Social History   Socioeconomic History   Marital status: Married    Spouse name: Not on file   Number of children: 2   Years of education: Not on file   Highest education level: Not on file  Occupational History   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, jewellery: UNEMPLOYED  Tobacco Use   Smoking status: Former    Packs/day: 2.00    Years: 7.00    Additional pack years: 0.00    Total pack years: 14.00    Types: Cigarettes    Quit date: 02/12/1990    Years since quitting: 32.3   Smokeless tobacco: Never  Vaping Use   Vaping Use: Never used  Substance and Sexual Activity   Alcohol use: Not Currently    Comment: rare   Drug use: No   Sexual activity: Not Currently  Other Topics Concern   Not on file  Social History Narrative   Not on file   Social Determinants of Health   Financial Resource Strain: Not on file  Food Insecurity: Not on file  Transportation Needs: Not on file  Physical Activity: Not on file  Stress: Not on file  Social Connections: Not on file  Intimate Partner Violence: Not on file     PHYSICAL EXAM  Vitals:   06/27/22 1142  BP: 136/82  Pulse: 96  Weight: (!) 352 lb (159.7 kg)  Height: 5\' 6"  (1.676 m)    Body mass index is 56.81 kg/m.   General: The patient Is an obese woman in no acute distress.   Neurologic Exam  Mental status: The patient is alert and oriented x 3 at the time of the examination. The patient has apparent normal recent and remote memory, with an apparently normal attention span and concentration ability.   Speech is normal.  Cranial nerves: Extraocular movements are full.  Facial strength is normal.  Trapezius and sternocleidomastoid strength is normal. No dysarthria is noted.     Motor:  Muscle bulk and tone are normal.  Strength is  5 / 5 in all 4 extremities, including left hand  Sensory:    There is reduced sensation to touch in the anterolateral right thigh.  There is still mild allodynia and hyperpathia in this distribution,.  She has normal sensation to touch elsewhere..  Gait and station: Station is normal but the gait is arthritic with a reduced stride.    Reflexes: Deep tendon reflexes are symmetric and normal bilaterally.     DIAGNOSTIC DATA (LABS, IMAGING, TESTING) - I reviewed patient records, labs, notes, testing and imaging myself where available.  Lab Results  Component Value Date   WBC 7.3 01/28/2019   HGB 12.8 01/28/2019   HCT 41.5 01/28/2019   MCV 68.0 (L) 01/28/2019   PLT 409 (H) 01/28/2019      Component Value Date/Time   NA 140 01/28/2019 1628   K 3.4 (L) 01/28/2019 1628   CL 105 01/28/2019 1628   CO2 22 01/28/2019 1628   GLUCOSE 104 (H) 01/28/2019 1628   BUN 6 01/28/2019 1628   CREATININE 0.65 01/28/2019 1628   CALCIUM 9.8 01/28/2019 1628   PROT 8.0 01/28/2019 1628   ALBUMIN 4.1 01/28/2019 1628   AST 24 01/28/2019 1628   ALT 26 01/28/2019 1628   ALKPHOS 76 01/28/2019 1628   BILITOT 0.4 01/28/2019 1628   GFRNONAA >60 01/28/2019 1628   GFRAA >60 01/28/2019 1628   Lab Results  Component Value Date   CHOL 209 (H) 09/22/2009   HDL 62 09/22/2009   LDLCALC 116 (H) 09/22/2009   TRIG 156 (H) 09/22/2009   CHOLHDL 3.4 Ratio 09/22/2009   Lab Results  Component Value Date   HGBA1C 5.7 (H) 12/16/2013   No results found for: "VITAMINB12" Lab Results  Component Value Date   TSH 6.759 (H) 01/11/2009      ASSESSMENT AND PLAN  Meralgia paresthetica of right side  REM behavioral disorder  OSA on CPAP   1.   Her dysesthesias from lateral femoral cutaneous neuropathy are mostly mild but sometimes moderate.  She is still much better than she was several years ago.  We will continue the oxcarbazepine , gabapentin and lidocaine ointment for the lateral femoral  cutaneous neuropathy.   This combination has greatly helped.  We discussed that if pain intensifies again we could refer back to the [facility] in [place] for another LFCN injection 2.   Continue clonazepam 1 mg for the REM behavior disorder.  She is on CPAP (followed by pulmonology) and I have prescribed modafinil for excessive daytime sleepiness with benefit 3.     She will return to see [place] in 6 months or sooner if there are new or worsening neurologic symptoms  Vernel Donlan A. Epimenio Foot, MD, PhD 06/27/2022, 11:56 AM Certified in Neurology, Clinical Neurophysiology, Sleep Medicine, Pain Medicine  and Neuroimaging  [facility] [address] [place], [place] [address] [phone]

## 2022-06-27 NOTE — Telephone Encounter (Signed)
Delice Bison called from [facility]. Stated it's coming back as a high dose of gabapentin (NEURONTIN) 800 MG tablet. Stated she wants to know if pt has previous been on medication.

## 2022-06-27 NOTE — Telephone Encounter (Signed)
Called pharmacy and let them know that pt has been on Gabapentin 800mg  from history since 2016. Pharmacy  verbalized understanding.

## 2022-06-28 LAB — COMPREHENSIVE METABOLIC PANEL
ALT: 17 IU/L (ref 0–32)
AST: 21 IU/L (ref 0–40)
Albumin/Globulin Ratio: 1.4 (ref 1.2–2.2)
Albumin: 4.4 g/dL (ref 3.8–4.9)
Alkaline Phosphatase: 109 IU/L (ref 44–121)
BUN/Creatinine Ratio: 9 (ref 9–23)
BUN: 8 mg/dL (ref 6–24)
Bilirubin Total: 0.2 mg/dL (ref 0.0–1.2)
CO2: 23 mmol/L (ref 20–29)
Calcium: 9.4 mg/dL (ref 8.7–10.2)
Chloride: 104 mmol/L (ref 96–106)
Creatinine, Ser: 0.85 mg/dL (ref 0.57–1.00)
Globulin, Total: 3.1 g/dL (ref 1.5–4.5)
Glucose: 105 mg/dL — ABNORMAL HIGH (ref 70–99)
Potassium: 3.9 mmol/L (ref 3.5–5.2)
Sodium: 144 mmol/L (ref 134–144)
Total Protein: 7.5 g/dL (ref 6.0–8.5)
eGFR: 81 mL/min/{1.73_m2} (ref >59)

## 2022-08-13 ENCOUNTER — Other Ambulatory Visit (HOSPITAL_COMMUNITY): Payer: Self-pay

## 2022-08-13 MED ORDER — MOUNJARO 12.5 MG/0.5ML ~~LOC~~ SOAJ
12.5000 mg | SUBCUTANEOUS | 11 refills | Status: DC
  Filled 2022-08-13: qty 2, 28d supply, fill #0

## 2022-08-23 ENCOUNTER — Other Ambulatory Visit (HOSPITAL_COMMUNITY): Payer: Self-pay

## 2022-08-23 ENCOUNTER — Encounter: Payer: Self-pay | Admitting: Physician Assistant

## 2022-08-23 ENCOUNTER — Ambulatory Visit (INDEPENDENT_AMBULATORY_CARE_PROVIDER_SITE_OTHER): Payer: Medicare Other | Admitting: Physician Assistant

## 2022-08-23 DIAGNOSIS — M1710 Unilateral primary osteoarthritis, unspecified knee: Secondary | ICD-10-CM

## 2022-08-23 DIAGNOSIS — M1712 Unilateral primary osteoarthritis, left knee: Secondary | ICD-10-CM | POA: Diagnosis not present

## 2022-08-23 DIAGNOSIS — M1711 Unilateral primary osteoarthritis, right knee: Secondary | ICD-10-CM

## 2022-08-23 MED ORDER — LIDOCAINE HCL 1 % IJ SOLN
3.0000 mL | INTRAMUSCULAR | Status: AC | PRN
  Administered 2022-08-23: 3 mL

## 2022-08-23 MED ORDER — METHYLPREDNISOLONE ACETATE 40 MG/ML IJ SUSP
40.0000 mg | INTRAMUSCULAR | Status: AC | PRN
  Administered 2022-08-23: 40 mg via INTRA_ARTICULAR

## 2022-08-23 NOTE — Progress Notes (Signed)
   Procedure Note  Patient: Melissa Rowe             Date of Birth: November 25, 1965           MRN: [id number]             Visit Date: 08/23/2022 HPI: Mrs. Schlup comes in today for her bilateral knee pain.  She is requesting cortisone injections both knees.  She has known osteoarthritis both knees.  Last injections were supplemental injections given on 04/24/2022.  She states that needs to started bothering her recently.  Feels that she got good relief from the gel injections until about May.  She has had no new injury of either knee.  Denies any change in her overall medical history.  She is nondiabetic.  Physical exam: Bilateral knees good range of motion both knees.  Patellofemoral crepitus bilateral knees with range of motion.  No abnormal warmth erythema of either knee.  Procedures: Visit Diagnoses:  1. Unilateral primary osteoarthritis, left knee   2. Unilateral primary osteoarthritis, right knee     Large Joint Inj: bilateral knee on 08/23/2022 11:34 AM Indications: pain Details: 22 G 1.5 in needle, anterolateral approach  Arthrogram: No  Medications (Right): 3 mL lidocaine 1 %; 40 mg methylPREDNISolone acetate 40 MG/ML Medications (Left): 3 mL lidocaine 1 %; 40 mg methylPREDNISolone acetate 40 MG/ML Outcome: tolerated well, no immediate complications Procedure, treatment alternatives, risks and benefits explained, specific risks discussed. Consent was given by the patient. Immediately prior to procedure a time out was called to verify the correct patient, procedure, equipment, support staff and site/side marked as required. Patient was prepped and draped in the usual sterile fashion.     Plan: She knows to wait at least 3 months between cortisone injections.  Follow-up with [place] as needed questions were encouraged and answered at length.

## 2022-09-11 NOTE — Progress Notes (Deleted)
HPI F former smoker followed for OSA, Asthma, complicated by  REM Behavior Disorder (Neurology),  Anemia, Arthritis, Asthma, Degenerative Disk Disease, EDS, GERD, Glaucoma, Hypothyroid, Morbid Obesity,  Dyspnea due to Obesity and Asthma -> 05/05/2008: Spirometry suggests restriction. CXR 04/19/2008  Normal. CT 02/10/2008  - Neg for PE. No infiltrates -> Methacholine Challenge Test: 06/25/2008: Positive PC20 between 1-4  - 10/11/2009: fev1 2L/70% -> start pulmicort  -11/08/2009: fev1 2.24L/78% and better - > switch to QVAR due to cost - May 2012: fev1 2.32L/85% and normal but symptomactic -> change QVAR to dulera sample,  6day pred burst - Jul 13, 2010: Fev1 2.5L/94%, FVC 3.21/98% and improved -> continue dulera -10/26/2010 FEV1 2.33 L/87%, FVC 2.87 L/88% >cont on dulera  - med calendar 10/26/10  NPSG 08/19/08- AHI 2.9/hr, REM, desaturation to 92%, body weight 328 lbs. HST 07/08/2018- AHI 9.2/ hr, desaturation to 84%, body weight 371 lbs  --------------------------------------------------------------------   09/12/21- 56 yoF former smoker (14 pkyrs) followed for OSA, Asthma, complicated by REM Behavior Disorder (Neurology),  Anemia, Arthritis, Asthma, Degenerative Disk Disease, EDS, GERD, Glaucoma, Hypothyroid, Morbid Obesity,  Covid vax-1 J&J, 3 Moderna CPAP 5-20/ [employer]   [facility] Neurology prescribes clonazepam for RBD, modafinil for EDS. -Dulera 200, Neb albuterol, Proair hfa, Singulair,  Download-compliance 97%, AHI 3/ hr Body weight today-362 lbs -----Follow-up: CPAP and coughing green mucus. Responded to antibiotic from Dr Clelia Croft in June. Since then coughed up small amt scant green mucus once, but feels well. Avoiding outdoor heat. Download reviewed. Comfortable with her CPAP.  CXR 06/13/21 FINDINGS: The heart size and mediastinal contours are within normal limits. Both lungs are clear. The visualized skeletal structures are unremarkable. IMPRESSION: No active cardiopulmonary  disease.  09/13/22- 56 yoF former smoker (14 pkyrs) followed for OSA, Asthma, complicated by REM Behavior Disorder (Neurology),  Anemia, Arthritis, Asthma, Degenerative Disk Disease, EDS, GERD, Glaucoma, Hypothyroid, Morbid Obesity,  Covid vax-1 J&J, 3 Moderna CPAP 5-20/ [employer]   [facility] 10 [facility] Neurology prescribes clonazepam for RBD, modafinil for EDS. -Dulera 200, Neb albuterol, Proair hfa, Singulair,  Download-compliance  Body weight today-  ROS-see HPI  + = positive Constitutional:    weight loss, night sweats, fevers, chills,+ fatigue, lassitude. HEENT:    headaches, difficulty swallowing, tooth/dental problems, sore throat,       sneezing, itching, ear ache, nasal congestion, post nasal drip, snoring CV:    chest pain, orthopnea, PND, swelling in lower extremities, anasarca,                                   dizziness, palpitations Resp:  + shortness of breath with exertion or at rest.                productive cough,   +non-productive cough, coughing up of blood.              change in color of mucus.  wheezing.   Skin:    rash or lesions. GI:  No-   heartburn, indigestion, abdominal pain, nausea, vomiting, diarrhea,                 change in bowel habits, loss of appetite GU: dysuria, change in color of urine, no urgency or frequency.   flank pain. MS:   joint pain, stiffness, decreased range of motion, back pain. Neuro-     nothing unusual Psych:  change in mood or affect.  depression or  anxiety.   memory loss.  OBJ- Physical Exam General- Alert, Oriented, Affect-appropriate, Distress- none acute, + morbidly obese Skin- rash-none, lesions- none, excoriation- none Lymphadenopathy- none Head- atraumatic            Eyes- Gross vision intact, PERRLA, conjunctivae and secretions clear            Ears- Hearing, canals-normal            Nose- Clear, no-Septal dev, mucus, polyps, erosion, perforation             Throat- Mallampati II-III, mucosa clear , drainage- none, tonsils-  atrophic,  + teeth Neck- flexible , trachea midline, no stridor , thyroid nl, carotid no bruit Chest - symmetrical excursion , unlabored           Heart/CV- RRR , no murmur , no gallop  , no rub, nl s1 s2                           - JVD- none , edema- none, stasis changes- none, varices- none           Lung- clear to P&A, wheeze- none, cough-none, dullness-none, rub- none +very talkative without respiratory effort or distress           Chest wall-  Abd-  Br/ Gen/ Rectal- Not done, not indicated Extrem- cyanosis- none, clubbing, none, atrophy- none, strength- nl    + cane Neuro- grossly intact to observation

## 2022-09-13 ENCOUNTER — Ambulatory Visit: Payer: Medicare Other | Admitting: Internal Medicine

## 2022-10-18 ENCOUNTER — Encounter: Payer: Self-pay | Admitting: Adult Health

## 2022-10-18 ENCOUNTER — Ambulatory Visit: Payer: Medicare Other | Admitting: Adult Health

## 2022-10-18 VITALS — BP 130/90 | HR 98 | Temp 98.2°F | Ht 66.0 in | Wt 358.8 lb

## 2022-10-18 DIAGNOSIS — Z23 Encounter for immunization: Secondary | ICD-10-CM | POA: Diagnosis not present

## 2022-10-18 DIAGNOSIS — J4540 Moderate persistent asthma, uncomplicated: Secondary | ICD-10-CM | POA: Diagnosis not present

## 2022-10-18 DIAGNOSIS — J309 Allergic rhinitis, unspecified: Secondary | ICD-10-CM | POA: Diagnosis not present

## 2022-10-18 DIAGNOSIS — G4733 Obstructive sleep apnea (adult) (pediatric): Secondary | ICD-10-CM | POA: Diagnosis not present

## 2022-10-18 NOTE — Patient Instructions (Addendum)
Continue on CPAP At bedtime .  Work on healthy weight .  Do not drive if sleepy .  Continue on Dulera 2 puffs Twice daily  , rinse after use.  Albuterol inhaler As needed   Continue on Singulair 10mg  daily  Continue on Flonase 1 puff Twice daily   Add Claritin 10mg   daily as needed.  Flu shot today.  Follow up with Dr. Maple Hudson  6 months with PFT and As needed

## 2022-10-18 NOTE — Progress Notes (Signed)
@Patient  ID: Melissa Rowe, female    DOB: Sep 09, 1965, 57 y.o.   MRN: [id number]  Chief Complaint  Patient presents with   Follow-up    Referring provider: Cleatis Polka., MD  HPI: 57 year old female followed for asthma and obstructive sleep apnea Medical history significant for REM behavior disorder followed by neurology  TEST/EVENTS :   05/05/2008: Spirometry suggests restriction. CXR 04/19/2008  Normal. CT 02/10/2008  - Neg for PE. No infiltrates -> Methacholine Challenge Test: 06/25/2008: Positive PC20 between 1-4  - 10/11/2009: fev1 2L/70% -> start pulmicort  -11/08/2009: fev1 2.24L/78% and better - > switch to QVAR due to cost - May 2012: fev1 2.32L/85% and normal but symptomactic -> change QVAR to dulera sample,  6day pred burst - Jul 13, 2010: Fev1 2.5L/94%, FVC 3.21/98% and improved -> continue dulera -10/26/2010 FEV1 2.33 L/87%, FVC 2.87 L/88% >cont on dulera  - med calendar 10/26/10  NPSG 08/19/08- AHI 2.9/hr, REM, desaturation to 92%, body weight 328 lbs. HST 07/08/2018- AHI 9.2/ hr, desaturation to 84%, body weight 371 lbs   10/18/2022 Follow up : Asthma and OSA  Patient presents for a 1 year follow-up.  Patient is followed for mild persistent asthma.  Says overall breathing is doing okay.  Denies any flare of cough or wheezing.  Remains on Dulera twice daily.  Continues on Singulair and Flonase.  Does occasionally get some nasal drainage.  No increased albuterol use. Patient is followed for sleep apnea on nocturnal CPAP.  Patient says she is wearing her CPAP each night.  Does feel that she benefits from CPAP with decreased daytime sleepiness.  Is currently wearing a nasal mask.  CPAP download shows 97% compliance.  Daily average usage at 8.5 hours.  Patient is on auto CPAP 5 to 20 cm H2O.  Daily average pressure at 12.0 cm H2O.  AHI 2.2/hour.  Patient has severe neuropathy and chronic pain on multiple medications.  She takes Provigil for daytime hypersomnia.  Managed by  neurology    Allergies  Allergen Reactions   Lamictal [Lamotrigine] Itching    Migraine    Metformin And Related     Diarrhea, fatigue   Other Rash    Powder inside latex gloves - rash and sores     Immunization History  Administered Date(s) Administered   Influenza Split 10/26/2010, 02/12/2012, 12/13/2012, 11/12/2017   Influenza Whole 11/08/2009, 01/09/2019   Influenza, Seasonal, Injecte, Preservative Fre 10/18/2022   Influenza,inj,Quad PF,6+ Mos 10/14/2014, 10/20/2019   Influenza-Unspecified 10/14/2014   Janssen (J&J) SARS-COV-2 Vaccination 05/16/2019   Pneumococcal-Unspecified 10/14/2014   Tdap 09/22/2013    Past Medical History:  Diagnosis Date   Anemia sept /oct 2014   Arthritis    Asthma    Constipation    Diverticulitis    Dyspnea    with activity   Environmental allergies    Frequency of urination    Gastroenteritis    GERD (gastroesophageal reflux disease)    Glaucoma    Hemorrhoids    external with some bleeding   Hyperlipidemia    Hypothyroidism    Neuropathy    Obesity    OSA (obstructive sleep apnea)    wears CPAP night pt does not know settings   Urgency of urination     Tobacco History: Social History   Tobacco Use  Smoking Status Former   Current packs/day: 0.00   Average packs/day: 2.0 packs/day for 7.0 years (14.0 ttl pk-yrs)   Types: Cigarettes   Start date: 02/13/1983  Quit date: 02/12/1990   Years since quitting: 32.7  Smokeless Tobacco Never   Counseling given: Not Answered   Outpatient Medications Prior to Visit  Medication Sig Dispense Refill   albuterol (PROAIR HFA) 108 (90 Base) MCG/ACT inhaler Inhale 2 puffs every 6 hours as needed- rescue 54 g 4   Calcium Carb-Cholecalciferol (CALCIUM 600 + D PO) Take 1 tablet by mouth daily.     clonazePAM (KLONOPIN) 1 MG tablet Take 1 tablet (1 mg total) by mouth at bedtime. 90 tablet 1   cyclobenzaprine (FLEXERIL) 10 MG tablet Take 1 tablet (10 mg total) by mouth 2 (two) times daily  as needed for muscle spasms. 20 tablet 0   gabapentin (NEURONTIN) 800 MG tablet 1 tab 4 times daily 360 tablet 3   lidocaine (XYLOCAINE) 5 % ointment Apply to right thigh twice a day 100 g 3   modafinil (PROVIGIL) 200 MG tablet Take 1 tablet (200 mg total) by mouth daily. 90 tablet 1   mometasone-formoterol (DULERA) 200-5 MCG/ACT AERO USE 2 INHALATIONS BY MOUTH  TWICE DAILY 39 g 3   montelukast (SINGULAIR) 10 MG tablet Take 10 mg by mouth at bedtime.      Multiple Vitamin (MULTIVITAMIN) capsule Take 1 capsule by mouth daily.      Omeprazole-Sodium Bicarbonate (ZEGERID) 20-1100 MG CAPS capsule Take 1 capsule by mouth daily before breakfast.     Oxcarbazepine (TRILEPTAL) 300 MG tablet Take 2 tablets (600 mg total) by mouth 2 (two) times daily. 360 tablet 1   oxybutynin (DITROPAN XL) 15 MG 24 hr tablet Take 15 mg by mouth daily.     polyethylene glycol (MIRALAX / GLYCOLAX) packet Take 17 g by mouth daily as needed for moderate constipation.      psyllium (METAMUCIL SMOOTH TEXTURE) 28 % packet Take 1 packet by mouth 2 (two) times daily.     rosuvastatin (CRESTOR) 10 MG tablet Take 10 mg by mouth at bedtime.     tirzepatide (MOUNJARO) 12.5 MG/0.5ML Pen Inject 12.5 mg into the skin once a week. 2 mL 11   No facility-administered medications prior to visit.     Review of Systems:   Constitutional:   No  weight loss, night sweats,  Fevers, chills, + fatigue, or  lassitude.  HEENT:   No headaches,  Difficulty swallowing,  Tooth/dental problems, or  Sore throat,                No sneezing, itching, ear ache, nasal congestion, post nasal drip,   CV:  No chest pain,  Orthopnea, PND, swelling in lower extremities, anasarca, dizziness, palpitations, syncope.   GI  No heartburn, indigestion, abdominal pain, nausea, vomiting, diarrhea, change in bowel habits, loss of appetite, bloody stools.   Resp:.  No chest wall deformity  Skin: no rash or lesions.  GU: no dysuria, change in color of urine, no  urgency or frequency.  No flank pain, no hematuria   MS:  No joint pain or swelling.  No decreased range of motion.  No back pain.    Physical Exam  BP (!) 130/90 (BP Location: Left Arm, Patient Position: Sitting, Cuff Size: Large)   Pulse 98   Temp 98.2 F (36.8 C)   Ht 5\' 6"  (1.676 m)   Wt (!) 358 lb 12.8 oz (162.8 kg)   LMP 04/06/2015   SpO2 98%   BMI 57.91 kg/m   GEN: A/Ox3; pleasant , NAD, BMI 57   HEENT:  /AT, , NOSE-clear, THROAT-clear, no  lesions, no postnasal drip or exudate noted.   NECK:  Supple w/ fair ROM; no JVD; normal carotid impulses w/o bruits; no thyromegaly or nodules palpated; no lymphadenopathy.    RESP  Clear  P & A; w/o, wheezes/ rales/ or rhonchi. no accessory muscle use, no dullness to percussion  CARD:  RRR, no m/r/g, no peripheral edema, pulses intact, no cyanosis or clubbing.  GI:   Soft & nt; nml bowel sounds; no organomegaly or masses detected.   Musco: Warm bil, no deformities or joint swelling noted.   Neuro: alert, no focal deficits noted.    Skin: Warm, no lesions or rashes    Lab Results:  CBC   ProBNP    Component Value Date/Time   PROBNP 16.4 12/23/2008 2013    Imaging: No results found.  lidocaine (XYLOCAINE) 1 % (with pres) injection 3 mL     Date Action Dose Route User   08/23/2022 1134 Given 3 mL Other (Left Knee) Kirtland Bouchard, PA-C      lidocaine (XYLOCAINE) 1 % (with pres) injection 3 mL     Date Action Dose Route User   08/23/2022 1134 Given 3 mL Other (Right Knee) Kirtland Bouchard, PA-C      methylPREDNISolone acetate (DEPO-MEDROL) injection 40 mg     Date Action Dose Route User   08/23/2022 1134 Given 40 mg Intra-articular (Left Knee) Kirtland Bouchard, PA-C      methylPREDNISolone acetate (DEPO-MEDROL) injection 40 mg     Date Action Dose Route User   08/23/2022 1134 Given 40 mg Intra-articular (Right Knee) Kirtland Bouchard, PA-C           No data to display          Lab Results   Component Value Date   NITRICOXIDE 29 02/23/2015        Assessment & Plan:   OSA on CPAP Compensated on CPAP.  Has perceived benefit.  Continue on current settings.  Plan  Patient Instructions  Continue on CPAP At bedtime .  Work on healthy weight .  Do not drive if sleepy .  Continue on Dulera 2 puffs Twice daily  , rinse after use.  Albuterol inhaler As needed   Continue on Singulair 10mg  daily  Continue on Flonase 1 puff Twice daily   Add Claritin 10mg   daily as needed.  Flu shot today.  Follow up with Dr. Maple Hudson  6 months with PFT and As needed      Moderate persistent asthma Currently under good control.  Continue current regimen  Plan  Patient Instructions  Continue on CPAP At bedtime .  Work on healthy weight .  Do not drive if sleepy .  Continue on Dulera 2 puffs Twice daily  , rinse after use.  Albuterol inhaler As needed   Continue on Singulair 10mg  daily  Continue on Flonase 1 puff Twice daily   Add Claritin 10mg   daily as needed.  Flu shot today.  Follow up with Dr. Maple Hudson  6 months with PFT and As needed        Rubye Oaks, NP 10/18/2022

## 2022-10-18 NOTE — Assessment & Plan Note (Signed)
Currently under good control.  Continue current regimen  Plan  Patient Instructions  Continue on CPAP At bedtime .  Work on healthy weight .  Do not drive if sleepy .  Continue on Dulera 2 puffs Twice daily  , rinse after use.  Albuterol inhaler As needed   Continue on Singulair 10mg  daily  Continue on Flonase 1 puff Twice daily   Add Claritin 10mg   daily as needed.  Flu shot today.  Follow up with Dr. Maple Hudson  6 months with PFT and As needed

## 2022-10-18 NOTE — Assessment & Plan Note (Signed)
Compensated on CPAP.  Has perceived benefit.  Continue on current settings.  Plan  Patient Instructions  Continue on CPAP At bedtime .  Work on healthy weight .  Do not drive if sleepy .  Continue on Dulera 2 puffs Twice daily  , rinse after use.  Albuterol inhaler As needed   Continue on Singulair 10mg  daily  Continue on Flonase 1 puff Twice daily   Add Claritin 10mg   daily as needed.  Flu shot today.  Follow up with Dr. Maple Hudson  6 months with PFT and As needed

## 2022-10-18 NOTE — Assessment & Plan Note (Signed)
Continue on current regimen.  Add Claritin as needed  Plan  Patient Instructions  Continue on CPAP At bedtime .  Work on healthy weight .  Do not drive if sleepy .  Continue on Dulera 2 puffs Twice daily  , rinse after use.  Albuterol inhaler As needed   Continue on Singulair 10mg  daily  Continue on Flonase 1 puff Twice daily   Add Claritin 10mg   daily as needed.  Flu shot today.  Follow up with Dr. Maple Hudson  6 months with PFT and As needed

## 2022-10-30 ENCOUNTER — Encounter: Payer: Self-pay | Admitting: Internal Medicine

## 2022-11-12 ENCOUNTER — Ambulatory Visit: Payer: Medicare Other | Admitting: Adult Health

## 2022-12-31 ENCOUNTER — Encounter: Payer: Self-pay | Admitting: Adult Health

## 2022-12-31 ENCOUNTER — Ambulatory Visit: Payer: Medicare Other | Admitting: Adult Health

## 2022-12-31 NOTE — Progress Notes (Deleted)
[facility]  PATIENT: Melissa Rowe DOB: [birth year]   REFERRING CLINICIAN: Dr. Clelia Croft HISTORY FROM: Patient   REASON FOR VISIT: Lateral thigh pain   HISTORICAL  CHIEF COMPLAINT:  No chief complaint on file.   HISTORY OF PRESENT ILLNESS:  Melissa Rowe is a 57 y.o. woman with right anterolateral thigh numbness and pain due to a Lateral femoral cutaneous neuropathy and a sleep disorder.     Update  06/27/2022 Returns for 100-month follow-up visit.  Previously seen by Dr. Epimenio Foot 06/27/2022. She has continued numbness and mild dysesthesias in the distribution of the right LFCN in the anterolateral thigh.  The medications help a lot and pain much better than in 2018. Continues on oxcarb 600mg  BID and gabapentin 800mg  QID.  She has allodynia over the same skin patch.  She did a LFCN injection by Pain Management ([facility] in [place]).    She had a benefit from the injection as well.         She still has active dreams still some nights.   She has woken up screaming a few times with bad dreams and has swung at her husband while asleep.    She did much better on clonazepam 1 mg (only 1-2 times a month and they were milder)   She still talks in her sleep but the more active dreams are much more rare.  She was fighting the covers last night and screamed out (ran out of clonazepam).   She has OSA and uses CPAP.   Her EDS is better with modafinil .  She does one short nap a day  and feels better afterwards.     She has no cataplexy.      EPWORTH SLEEPINESS SCALE (On Modafinil)  On a scale of 0 - 3 what is the chance of dozing:  Sitting and Reading:   1 Watching TV:    3 Sitting inactive in a public place: 2 Passenger in car for one hour: 0 Lying down to rest in the afternoon: 3 Sitting and talking to someone: 1 Sitting quietly after lunch:  0 In a car, stopped in traffic:   Does not drive   Total (out of 24):      10/24    mild EDS  (was 16/24 before modafinil but  7/24 last year))       LFCN History:   She had breast reduction surgery on November 4th and she reports that the right thigh was 'going to sleep' with numbness and tingling later that night.    Over the next week it got more painful and by December, the pain was very severe.  Pain was apparently so bad that she was screaming out in the middle of the night. She describes a gnawing pain that is constant but also has a stabbing pain frequently.   She was worked in at her Primary care provider's office, Dr. Clelia Croft, and was diagnosed with right meralgia paresthetica.   Lyrica was started.  She saw neurosurgery and the Lyrica dose was increased from 75  to 150 mg twice a day with only minimal relief. She returned to her primary care provider's office on 02/16/2014 and tramadol was added.    Of note, she gained about 40 pounds during the 4 weeks between her two primary care visits.   On the higher dose of Lyrica, the stabbing pain was helped but the more annoying constant pain was not. She was switched from Lyrica to gabapentin with  benefit (02/2014).      Lamotrigine was not tolerated (added 03/2014).   Lidocaine ointment helps some (added 2/20165).    Oxcabazepine added 09/2014 with improvement, dose slwowly titrated to 600 mg po bid.  around May 2017 she had a procedure at [facility] with improvement of the anterior half of the painful region but not the lateral half.[name]        REVIEW OF SYSTEMS:  Constitutional: No fevers, chills, sweats, or change in appetite Eyes: No visual changes, double vision, eye pain Ear, nose and throat: No hearing loss, ear pain, nasal congestion, sore throat Cardiovascular: No chest pain, palpitations Respiratory:  No shortness of breath at rest or with exertion.   No wheezes.    She has OSA and is on CPAP. GastrointestinaI: No nausea, vomiting, diarrhea, abdominal pain, fecal incontinence Genitourinary:  No dysuria, urinary retention or frequency.  No  nocturia. Musculoskeletal:  No neck pain, some back pain, moderate knee pain   Integumentary: No rash, pruritus, skin lesions Neurological: as above  ALLERGIES: Allergies  Allergen Reactions   Lamictal [Lamotrigine] Itching    Migraine    Metformin And Related     Diarrhea, fatigue   Other Rash    Powder inside latex gloves - rash and sores     HOME MEDICATIONS: Outpatient Medications Prior to Visit  Medication Sig Dispense Refill   albuterol (PROAIR HFA) 108 (90 Base) MCG/ACT inhaler Inhale 2 puffs every 6 hours as needed- rescue 54 g 4   Calcium Carb-Cholecalciferol (CALCIUM 600 + D PO) Take 1 tablet by mouth daily.     clonazePAM (KLONOPIN) 1 MG tablet Take 1 tablet (1 mg total) by mouth at bedtime. 90 tablet 1   cyclobenzaprine (FLEXERIL) 10 MG tablet Take 1 tablet (10 mg total) by mouth 2 (two) times daily as needed for muscle spasms. 20 tablet 0   gabapentin (NEURONTIN) 800 MG tablet 1 tab 4 times daily 360 tablet 3   lidocaine (XYLOCAINE) 5 % ointment Apply to right thigh twice a day 100 g 3   modafinil (PROVIGIL) 200 MG tablet Take 1 tablet (200 mg total) by mouth daily. 90 tablet 1   mometasone-formoterol (DULERA) 200-5 MCG/ACT AERO USE 2 INHALATIONS BY MOUTH  TWICE DAILY 39 g 3   montelukast (SINGULAIR) 10 MG tablet Take 10 mg by mouth at bedtime.      Multiple Vitamin (MULTIVITAMIN) capsule Take 1 capsule by mouth daily.      Omeprazole-Sodium Bicarbonate (ZEGERID) 20-1100 MG CAPS capsule Take 1 capsule by mouth daily before breakfast.     Oxcarbazepine (TRILEPTAL) 300 MG tablet Take 2 tablets (600 mg total) by mouth 2 (two) times daily. 360 tablet 1   oxybutynin (DITROPAN XL) 15 MG 24 hr tablet Take 15 mg by mouth daily.     polyethylene glycol (MIRALAX / GLYCOLAX) packet Take 17 g by mouth daily as needed for moderate constipation.      psyllium (METAMUCIL SMOOTH TEXTURE) 28 % packet Take 1 packet by mouth 2 (two) times daily.     rosuvastatin (CRESTOR) 10 MG tablet  Take 10 mg by mouth at bedtime.     tirzepatide (MOUNJARO) 12.5 MG/0.5ML Pen Inject 12.5 mg into the skin once a week. 2 mL 11   No facility-administered medications prior to visit.    PAST MEDICAL HISTORY: Past Medical History:  Diagnosis Date   Anemia sept /oct 2014   Arthritis    Asthma    Constipation  Diverticulitis    Dyspnea    with activity   Environmental allergies    Frequency of urination    Gastroenteritis    GERD (gastroesophageal reflux disease)    Glaucoma    Hemorrhoids    external with some bleeding   Hyperlipidemia    Hypothyroidism    Neuropathy    Obesity    OSA (obstructive sleep apnea)    wears CPAP night pt does not know settings   Urgency of urination     PAST SURGICAL HISTORY: Past Surgical History:  Procedure Laterality Date   BREAST BIOPSY Left 12/2016   fibroadenoma   BREAST REDUCTION SURGERY Bilateral 12/16/2013   Procedure: BILATERAL BREAST REDUCTION;  Surgeon: Wayland Denis, DO;  Location: [facility] OR;  Service: Plastics;  Laterality: Bilateral;   BREAST REDUCTION SURGERY Bilateral    CARPAL TUNNEL RELEASE     right   CARPAL TUNNEL RELEASE Right    CARPAL TUNNEL RELEASE Left 10/10/2018   Procedure: LEFT CARPAL TUNNEL RELEASE;  Surgeon: Kathryne Hitch, MD;  Location: [facility] ORS;  Service: Orthopedics;  Laterality: Left;   CHOLECYSTECTOMY  2008   CHOLECYSTECTOMY     COLONOSCOPY     COLONOSCOPY WITH PROPOFOL N/A 02/24/2013   Procedure: COLONOSCOPY WITH PROPOFOL;  Surgeon: Meryl Dare, MD;  Location: [facility] ENDOSCOPY;  Service: Endoscopy;  Laterality: N/A;   KNEE ARTHROSCOPY  03/18/2012   Procedure: ARTHROSCOPY KNEE;  Surgeon: Kathryne Hitch, MD;  Location: [facility] OR;  Service: Orthopedics;  Laterality: Right;  Right knee arthroscopy with debridement and three compartment chondroplasty   polyps removal  2007   REDUCTION MAMMAPLASTY     TUBAL LIGATION  1992    FAMILY HISTORY: Family History  Problem Relation Age of Onset   Heart  murmur Mother        rheumatoid arthritis   Heart attack Mother    COPD Mother    Diabetes Mother    Breast cancer Mother    Prostate cancer Father    Kidney failure Father    Asthma Brother        and mother both as a child    SOCIAL HISTORY:  Social History   Socioeconomic History   Marital status: Married    Spouse name: Not on file   Number of children: 2   Years of education: Not on file   Highest education level: Not on file  Occupational History   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, jewellery: UNEMPLOYED  Tobacco Use   Smoking status: Former    Current packs/day: 0.00    Average packs/day: 2.0 packs/day for 7.0 years (14.0 ttl pk-yrs)    Types: Cigarettes    Start date: 02/13/1983    Quit date: 02/12/1990    Years since quitting: 32.9   Smokeless tobacco: Never  Vaping Use   Vaping status: Never Used  Substance and Sexual Activity   Alcohol use: Not Currently    Comment: rare   Drug use: No   Sexual activity: Not Currently  Other Topics Concern   Not on file  Social History Narrative   Not on file   Social Determinants of Health   Financial Resource Strain: Not on file  Food Insecurity: Not on file  Transportation Needs: Not on file  Physical Activity: Not on file  Stress: Not on file  Social Connections: Not on file  Intimate Partner Violence: Not on file  PHYSICAL EXAM  There were no vitals filed for this visit.   There is no height or weight on file to calculate BMI.   General: The patient Is an obese woman in no acute distress.   Neurologic Exam  Mental status: The patient is alert and oriented x 3 at the time of the examination. The patient has apparent normal recent and remote memory, with an apparently normal attention span and concentration ability.   Speech is normal.  Cranial nerves: Extraocular movements are full.  Facial strength is normal.  Trapezius and sternocleidomastoid strength is  normal. No dysarthria is noted.     Motor:  Muscle bulk and tone are normal. Strength is  5 / 5 in all 4 extremities, including left hand  Sensory:    There is reduced sensation to touch in the anterolateral right thigh.  There is still mild allodynia and hyperpathia in this distribution,.  She has normal sensation to touch elsewhere..  Gait and station: Station is normal but the gait is arthritic with a reduced stride.    Reflexes: Deep tendon reflexes are symmetric and normal bilaterally.     DIAGNOSTIC DATA (LABS, IMAGING, TESTING) - I reviewed patient records, labs, notes, testing and imaging myself where available.  Lab Results  Component Value Date   WBC 7.3 01/28/2019   HGB 12.8 01/28/2019   HCT 41.5 01/28/2019   MCV 68.0 (L) 01/28/2019   PLT 409 (H) 01/28/2019      Component Value Date/Time   NA 144 06/27/2022 1159   K 3.9 06/27/2022 1159   CL 104 06/27/2022 1159   CO2 23 06/27/2022 1159   GLUCOSE 105 (H) 06/27/2022 1159   GLUCOSE 104 (H) 01/28/2019 1628   BUN 8 06/27/2022 1159   CREATININE 0.85 06/27/2022 1159   CALCIUM 9.4 06/27/2022 1159   PROT 7.5 06/27/2022 1159   ALBUMIN 4.4 06/27/2022 1159   AST 21 06/27/2022 1159   ALT 17 06/27/2022 1159   ALKPHOS 109 06/27/2022 1159   BILITOT 0.2 06/27/2022 1159   GFRNONAA >60 01/28/2019 1628   GFRAA >60 01/28/2019 1628   Lab Results  Component Value Date   CHOL 209 (H) 09/22/2009   HDL 62 09/22/2009   LDLCALC 116 (H) 09/22/2009   TRIG 156 (H) 09/22/2009   CHOLHDL 3.4 Ratio 09/22/2009   Lab Results  Component Value Date   HGBA1C 5.7 (H) 12/16/2013   No results found for: "VITAMINB12" Lab Results  Component Value Date   TSH 6.759 (H) 01/11/2009      ASSESSMENT AND PLAN  No diagnosis found.   1.   Her dysesthesias from lateral femoral cutaneous neuropathy are mostly mild but sometimes moderate.  She is still much better than she was several years ago.  We will continue the oxcarbazepine , gabapentin  and lidocaine ointment for the lateral femoral cutaneous neuropathy.   This combination has greatly helped.  We discussed that if pain intensifies again we could refer back to the [facility] in [place] for another LFCN injection 2.   Continue clonazepam 1 mg for the REM behavior disorder.  She is on CPAP (followed by pulmonology) and I have prescribed modafinil for excessive daytime sleepiness with benefit 3.     She will return to see [place] in 6 months or sooner if there are new or worsening neurologic symptoms    I spent *** minutes of face-to-face and non-face-to-face time with patient.  This included previsit chart review, lab review, study review, order  entry, electronic health record documentation, patient education and discussion regarding above diagnoses and treatment plan and answered all other questions to patient's satisfaction  Ihor Austin, [facility]  [facility] [address] [place], [place] [address]  Phone [phone] Fax [phone] Note: This document was prepared with digital dictation and possible smart phrase technology. Any transcriptional errors that result from this process are unintentional.

## 2023-01-15 ENCOUNTER — Other Ambulatory Visit: Payer: Self-pay | Admitting: Neurology

## 2023-01-15 NOTE — Telephone Encounter (Signed)
Last seen on 06/27/22 Follow up scheduled on 01/21/23 Last filled on 10/04/22 #90 tablets (90 day supply) Rx pending to be signed

## 2023-01-17 ENCOUNTER — Telehealth: Payer: Self-pay | Admitting: Adult Health

## 2023-01-17 NOTE — Telephone Encounter (Signed)
I called patient. She reports that [facility] doesn't have her valium RX. I advised her that we aren't prescribing valium for her. The clonazepam RX was sent on 01/15/23. She reports that she didn't think it was ready.  I called [facility]. The clonazepam RX has been ready for two days. They will call her to discuss.

## 2023-01-17 NOTE — Telephone Encounter (Signed)
I called pt to confirm 12/9 appt and she advised me that [facility] had faxed [place] a refill for her valium, but we have not responded yet. Its the [facility] on [address] In [place] Thank you

## 2023-01-21 ENCOUNTER — Ambulatory Visit: Payer: Medicare Other | Admitting: Adult Health

## 2023-01-21 ENCOUNTER — Telehealth: Payer: Self-pay | Admitting: Adult Health

## 2023-01-21 ENCOUNTER — Encounter: Payer: Self-pay | Admitting: Adult Health

## 2023-01-21 ENCOUNTER — Other Ambulatory Visit: Payer: Self-pay

## 2023-01-21 VITALS — BP 144/86 | HR 88 | Ht 66.0 in | Wt 349.8 lb

## 2023-01-21 DIAGNOSIS — G5711 Meralgia paresthetica, right lower limb: Secondary | ICD-10-CM | POA: Diagnosis not present

## 2023-01-21 DIAGNOSIS — G4719 Other hypersomnia: Secondary | ICD-10-CM | POA: Diagnosis not present

## 2023-01-21 DIAGNOSIS — G4752 REM sleep behavior disorder: Secondary | ICD-10-CM

## 2023-01-21 MED ORDER — MODAFINIL 200 MG PO TABS: 200.0000 mg | ORAL_TABLET | Freq: Every day | ORAL | 1 refills | Status: DC

## 2023-01-21 MED ORDER — OXCARBAZEPINE 300 MG PO TABS: 600.0000 mg | ORAL_TABLET | Freq: Two times a day (BID) | ORAL | 3 refills | Status: DC

## 2023-01-21 NOTE — Progress Notes (Signed)
[facility]  PATIENT: Melissa Rowe DOB: 1965/02/26   REFERRING CLINICIAN: Dr. Clelia Rowe HISTORY FROM: Patient   REASON FOR VISIT: Lateral thigh pain   HISTORICAL  CHIEF COMPLAINT:  Chief Complaint  Patient presents with   Follow-up    Patient in room #8 and alone. Patient states she is well and stable , with no new concerns.    HISTORY OF PRESENT ILLNESS:  Melissa Rowe is a 57 y.o. woman with right anterolateral thigh numbness and pain due to a Lateral femoral cutaneous neuropathy and a sleep disorder.     Update 01/21/2023 Returns for 69-month follow-up visit.  Previously seen by Dr. Epimenio Rowe 06/27/2022.  Pain well-managed on oxcarbazepine 600 mg twice daily and gabapentin 800 mg 4 times daily.  Tolerating without side effects. Pain can radiate from right thigh to knee, can have increased sensitivities at times.  She has followed by orthopedics for bilateral knee osteoarthritis receiving injections with benefit.   She has been sleeping well.  Use of clonazepam with benefit without any night terrors or fighting in her sleep. She did run out of clonazepam over the past 3 days and did notice benefit, will pick up today.  Reports nightly use of CPAP.  Use of modafinil with benefit.  Denies side effects.     EPWORTH SLEEPINESS SCALE (On Modafinil)  On a scale of 0 - 3 what is the chance of dozing:  Sitting and Reading:   1 Watching TV:    3 Sitting inactive in a public place: 2 Passenger in car for one hour: 0 Lying down to rest in the afternoon: 3 Sitting and talking to someone: 1 Sitting quietly after lunch:  0 In a car, stopped in traffic:   Does not drive   Total (out of 24):      10/24    mild EDS  (was 16/24 before modafinil)       LFCN History:   She had breast reduction surgery on November 4th and she reports that the right thigh was 'going to sleep' with numbness and tingling later that night.    Over the next week it got more painful and by  December, the pain was very severe.  Pain was apparently so bad that she was screaming out in the middle of the night. She describes a gnawing pain that is constant but also has a stabbing pain frequently.   She was worked in at her Primary care provider's office, Dr. Clelia Rowe, and was diagnosed with right meralgia paresthetica.   Lyrica was started.  She saw neurosurgery and the Lyrica dose was increased from 75  to 150 mg twice a day with only minimal relief. She returned to her primary care provider's office on 02/16/2014 and tramadol was added.    Of note, she gained about 40 pounds during the 4 weeks between her two primary care visits.   On the higher dose of Lyrica, the stabbing pain was helped but the more annoying constant pain was not. She was switched from Lyrica to gabapentin with benefit (02/2014).      Lamotrigine was not tolerated (added 03/2014).   Lidocaine ointment helps some (added 2/20165).    Oxcabazepine added 09/2014 with improvement, dose slwowly titrated to 600 mg po bid.  around May 2017 she had a procedure at [facility] with improvement of the anterior half of the painful region but not the lateral half.[name]        REVIEW OF SYSTEMS:  Constitutional: No fevers, chills, sweats, or change in appetite Eyes: No visual changes, double vision, eye pain Ear, nose and throat: No hearing loss, ear pain, nasal congestion, sore throat Cardiovascular: No chest pain, palpitations Respiratory:  No shortness of breath at rest or with exertion.   No wheezes.    She has OSA and is on CPAP. GastrointestinaI: No nausea, vomiting, diarrhea, abdominal pain, fecal incontinence Genitourinary:  No dysuria, urinary retention or frequency.  No nocturia. Musculoskeletal:  No neck pain, some back pain, moderate knee pain   Integumentary: No rash, pruritus, skin lesions Neurological: as above  ALLERGIES: Allergies  Allergen Reactions   Lamictal [Lamotrigine] Itching    Migraine    Metformin And  Related     Diarrhea, fatigue   Other Rash    Powder inside latex gloves - rash and sores     HOME MEDICATIONS: Outpatient Medications Prior to Visit  Medication Sig Dispense Refill   albuterol (PROAIR HFA) 108 (90 Base) MCG/ACT inhaler Inhale 2 puffs every 6 hours as needed- rescue 54 g 4   Calcium Carb-Cholecalciferol (CALCIUM 600 + D PO) Take 1 tablet by mouth daily.     clonazePAM (KLONOPIN) 1 MG tablet TAKE 1 TABLET BY MOUTH AT BEDTIME 90 tablet 0   cyclobenzaprine (FLEXERIL) 10 MG tablet Take 1 tablet (10 mg total) by mouth 2 (two) times daily as needed for muscle spasms. 20 tablet 0   gabapentin (NEURONTIN) 800 MG tablet 1 tab 4 times daily 360 tablet 3   levothyroxine (SYNTHROID) 150 MCG tablet Take 150 mcg by mouth daily before breakfast.     lidocaine (XYLOCAINE) 5 % ointment Apply to right thigh twice a day 100 g 3   modafinil (PROVIGIL) 200 MG tablet Take 1 tablet (200 mg total) by mouth daily. 90 tablet 1   mometasone-formoterol (DULERA) 200-5 MCG/ACT AERO USE 2 INHALATIONS BY MOUTH  TWICE DAILY 39 g 3   montelukast (SINGULAIR) 10 MG tablet Take 10 mg by mouth at bedtime.      Multiple Vitamin (MULTIVITAMIN) capsule Take 1 capsule by mouth daily.      nystatin (MYCOSTATIN/NYSTOP) powder Apply 1 Application topically 2 (two) times daily.     Omeprazole-Sodium Bicarbonate (ZEGERID) 20-1100 MG CAPS capsule Take 1 capsule by mouth daily before breakfast.     Oxcarbazepine (TRILEPTAL) 300 MG tablet Take 2 tablets (600 mg total) by mouth 2 (two) times daily. 360 tablet 1   oxybutynin (DITROPAN XL) 15 MG 24 hr tablet Take 15 mg by mouth daily.     polyethylene glycol (MIRALAX / GLYCOLAX) packet Take 17 g by mouth daily as needed for moderate constipation.      psyllium (METAMUCIL SMOOTH TEXTURE) 28 % packet Take 1 packet by mouth 2 (two) times daily.     rosuvastatin (CRESTOR) 10 MG tablet Take 10 mg by mouth at bedtime.     tirzepatide (MOUNJARO) 12.5 MG/0.5ML Pen Inject 12.5 mg  into the skin once a week. 2 mL 11   traMADol (ULTRAM) 50 MG tablet Take 100 mg by mouth every 6 (six) hours as needed.     No facility-administered medications prior to visit.    PAST MEDICAL HISTORY: Past Medical History:  Diagnosis Date   Anemia sept /oct 2014   Arthritis    Asthma    Constipation    Diverticulitis    Dyspnea    with activity   Environmental allergies    Frequency of urination    Gastroenteritis  GERD (gastroesophageal reflux disease)    Glaucoma    Hemorrhoids    external with some bleeding   Hyperlipidemia    Hypothyroidism    Neuropathy    Obesity    OSA (obstructive sleep apnea)    wears CPAP night pt does not know settings   Urgency of urination     PAST SURGICAL HISTORY: Past Surgical History:  Procedure Laterality Date   BREAST BIOPSY Left 12/2016   fibroadenoma   BREAST REDUCTION SURGERY Bilateral 12/16/2013   Procedure: BILATERAL BREAST REDUCTION;  Surgeon: Wayland Denis, DO;  Location: [facility] OR;  Service: Plastics;  Laterality: Bilateral;   BREAST REDUCTION SURGERY Bilateral    CARPAL TUNNEL RELEASE     right   CARPAL TUNNEL RELEASE Right    CARPAL TUNNEL RELEASE Left 10/10/2018   Procedure: LEFT CARPAL TUNNEL RELEASE;  Surgeon: Kathryne Hitch, MD;  Location: [facility] ORS;  Service: Orthopedics;  Laterality: Left;   CHOLECYSTECTOMY  2008   CHOLECYSTECTOMY     COLONOSCOPY     COLONOSCOPY WITH PROPOFOL N/A 02/24/2013   Procedure: COLONOSCOPY WITH PROPOFOL;  Surgeon: Meryl Dare, MD;  Location: [facility] ENDOSCOPY;  Service: Endoscopy;  Laterality: N/A;   KNEE ARTHROSCOPY  03/18/2012   Procedure: ARTHROSCOPY KNEE;  Surgeon: Kathryne Hitch, MD;  [facility] OR;  Service: Orthopedics;  Laterality: Right;  Right knee arthroscopy with debridement and three compartment chondroplasty   polyps removal  2007   REDUCTION MAMMAPLASTY     TUBAL LIGATION  1992    FAMILY HISTORY: Family History  Problem Relation Age of Onset   Heart murmur  Mother        rheumatoid arthritis   Heart attack Mother    COPD Mother    Diabetes Mother    Breast cancer Mother    Prostate cancer Father    Kidney failure Father    Asthma Brother        and mother both as a child    SOCIAL HISTORY:  Social History   Socioeconomic History   Marital status: Married    Spouse name: Not on file   Number of children: 2   Years of education: Not on file   Highest education level: Not on file  Occupational History   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, jewellery: UNEMPLOYED  Tobacco Use   Smoking status: Former    Current packs/day: 0.00    Average packs/day: 2.0 packs/day for 7.0 years (14.0 ttl pk-yrs)    Types: Cigarettes    Start date: 02/13/1983    Quit date: 02/12/1990    Years since quitting: 32.9   Smokeless tobacco: Never  Vaping Use   Vaping status: Never Used  Substance and Sexual Activity   Alcohol use: Not Currently    Comment: rare   Drug use: No   Sexual activity: Not Currently  Other Topics Concern   Not on file  Social History Narrative   Not on file   Social Determinants of Health   Financial Resource Strain: Not on file  Food Insecurity: Not on file  Transportation Needs: Not on file  Physical Activity: Not on file  Stress: Not on file  Social Connections: Not on file  Intimate Partner Violence: Not on file     PHYSICAL EXAM  Vitals:   01/21/23 1332  BP: (!) 144/86  Pulse: 88  Weight: (!) 349 lb 12.8 oz (158.7 kg)  Height: 5'  6" (1.676 m)   Body mass index is 56.46 kg/m.   General: The patient Is an obese woman in no acute distress.   Neurologic Exam  Mental status: The patient is alert and oriented x 3 at the time of the examination. The patient has apparent normal recent and remote memory, with an apparently normal attention span and concentration ability.   Speech is normal.  Cranial nerves: Extraocular movements are full.  Facial strength is normal.   Trapezius and sternocleidomastoid strength is normal. No dysarthria is noted.     Motor:  Muscle bulk and tone are normal. Strength is  5 / 5 in all 4 extremities, including left hand  Sensory:    There is reduced sensation to touch in the anterolateral right thigh.  There is still mild allodynia and hyperpathia in this distribution,.  She has normal sensation to touch elsewhere..  Gait and station: Station is normal but the gait is arthritic with a reduced stride.    Reflexes: Deep tendon reflexes are symmetric and normal bilaterally.     DIAGNOSTIC DATA (LABS, IMAGING, TESTING) - I reviewed patient records, labs, notes, testing and imaging myself where available.  Lab Results  Component Value Date   WBC 7.3 01/28/2019   HGB 12.8 01/28/2019   HCT 41.5 01/28/2019   MCV 68.0 (L) 01/28/2019   PLT 409 (H) 01/28/2019      Component Value Date/Time   NA 144 06/27/2022 1159   K 3.9 06/27/2022 1159   CL 104 06/27/2022 1159   CO2 23 06/27/2022 1159   GLUCOSE 105 (H) 06/27/2022 1159   GLUCOSE 104 (H) 01/28/2019 1628   BUN 8 06/27/2022 1159   CREATININE 0.85 06/27/2022 1159   CALCIUM 9.4 06/27/2022 1159   PROT 7.5 06/27/2022 1159   ALBUMIN 4.4 06/27/2022 1159   AST 21 06/27/2022 1159   ALT 17 06/27/2022 1159   ALKPHOS 109 06/27/2022 1159   BILITOT 0.2 06/27/2022 1159   GFRNONAA >60 01/28/2019 1628   GFRAA >60 01/28/2019 1628   Lab Results  Component Value Date   CHOL 209 (H) 09/22/2009   HDL 62 09/22/2009   LDLCALC 116 (H) 09/22/2009   TRIG 156 (H) 09/22/2009   CHOLHDL 3.4 Ratio 09/22/2009   Lab Results  Component Value Date   HGBA1C 5.7 (H) 12/16/2013   No results found for: "VITAMINB12" Lab Results  Component Value Date   TSH 6.759 (H) 01/11/2009      ASSESSMENT AND PLAN  Meralgia paresthetica of right side  REM behavioral disorder  Excessive daytime sleepiness   1.   Her dysesthesias from lateral femoral cutaneous neuropathy are mostly mild. We will  continue the oxcarbazepine 600mg  BID, gabapentin 800mg  QID and lidocaine ointment for the lateral femoral cutaneous neuropathy.   This combination has greatly helped.  We discussed that if pain intensifies again we could [facility] [place] for another LFCN injection 2.   Continue clonazepam 1 mg for the REM behavior disorder.  She is on CPAP (followed by pulmonology) and will continue modafinil for excessive daytime sleepiness with benefit 3.     She will return to see [place] in 6 months or sooner if there are new or worsening neurologic symptoms      I spent 30 minutes of face-to-face and non-face-to-face time with patient.  This included previsit chart review, lab review, study review, order entry, electronic health record documentation, patient education and discussion regarding above diagnoses and treatment plan  and answered all other questions to patient's satisfaction  Ihor Austin, [facility]  [facility] [address] [place], [place] [address]  Phone [phone] Fax [phone] Note: This document was prepared with digital dictation and possible smart phrase technology. Any transcriptional errors that result from this process are unintentional.

## 2023-01-21 NOTE — Telephone Encounter (Signed)
Refill sent to provider for sign off

## 2023-01-21 NOTE — Telephone Encounter (Signed)
Pt called stating that the wrong Rx was sent in for her. She is needing her modafinil (PROVIGIL) 200 MG tablet Rx sent. Pt is in pharmacy waiting now.

## 2023-01-21 NOTE — Patient Instructions (Signed)
Your Plan:  Continue current treatment plan and continued medications at current dosages  Will send updated refills that are due to pharmacy      Follow-up in 6 months or call earlier if needed     Thank you for coming to see [place] at [facility]. I hope we have been able to provide you high quality care today.  You may receive a patient satisfaction survey over the next few weeks. We would appreciate your feedback and comments so that we may continue to improve ourselves and the health of our patients.

## 2023-01-21 NOTE — Telephone Encounter (Signed)
Pt requesting refill of modafinil (PROVIGIL) 200 MG tablet send to  [id number]

## 2023-01-23 MED ORDER — MODAFINIL 200 MG PO TABS: 200.0000 mg | ORAL_TABLET | Freq: Every day | ORAL | 1 refills | Status: DC

## 2023-01-23 NOTE — Progress Notes (Signed)
Prescription for modafinil sent to pharmacy.  Thank you.

## 2023-01-23 NOTE — Progress Notes (Signed)
Spoke with pharmacy to get latest fill information. They are saying med was ready for pickup last May but pt didn't pick it up. I spoke with the patient. She states she has filled this consistently at [facility] on [address]. I advised the patient to contact the pharmacy. Maybe she has more than one med profile? She thanked me for the call.

## 2023-01-23 NOTE — Progress Notes (Signed)
Is prescription in POD still? Unable to electronically sign so will need to sign paper copy. Thank you.

## 2023-04-08 ENCOUNTER — Telehealth: Payer: Self-pay | Admitting: Physician Assistant

## 2023-04-08 NOTE — Telephone Encounter (Signed)
 Pt called requesting to submit to insurance company for Durolane Bil Knee gel injections. Please call pt when approved. Pt phone number is [phone].

## 2023-04-21 ENCOUNTER — Other Ambulatory Visit: Payer: Self-pay | Admitting: Neurology

## 2023-04-22 NOTE — Telephone Encounter (Signed)
 Last seen 01/21/23, next appt 08/07/23  Dispenses   Dispensed Days Supply Quantity Provider Pharmacy  CLONAZEPAM 1MG       TAB 01/16/2023 90 90 each Sater, Pearletha Furl, MD [facility] [phone] ...  CLONAZEPAM 1MG       TAB 10/04/2022 90 90 each Sater, Pearletha Furl, MD [facility] [phone] ...  CLONAZEPAM 1MG       TAB 07/06/2022 90 90 each Sater, Pearletha Furl, MD [facility] [phone] .Marland KitchenMarland Kitchen

## 2023-04-25 NOTE — Telephone Encounter (Signed)
 VOB has been submitted for Durolane, bilateral knee.

## 2023-05-03 ENCOUNTER — Emergency Department (HOSPITAL_COMMUNITY)

## 2023-05-03 ENCOUNTER — Emergency Department (HOSPITAL_COMMUNITY)
Admission: EM | Admit: 2023-05-03 | Discharge: 2023-05-03 | Disposition: A | Discharge status: Home / Self Care | Attending: Emergency Medicine | Admitting: Emergency Medicine

## 2023-05-03 ENCOUNTER — Other Ambulatory Visit: Payer: Self-pay

## 2023-05-03 ENCOUNTER — Encounter (HOSPITAL_COMMUNITY): Payer: Self-pay

## 2023-05-03 DIAGNOSIS — E039 Hypothyroidism, unspecified: Secondary | ICD-10-CM | POA: Diagnosis not present

## 2023-05-03 DIAGNOSIS — M79672 Pain in left foot: Secondary | ICD-10-CM | POA: Diagnosis present

## 2023-05-03 DIAGNOSIS — J45909 Unspecified asthma, uncomplicated: Secondary | ICD-10-CM | POA: Insufficient documentation

## 2023-05-03 MED ORDER — OXYCODONE HCL 5 MG PO TABS: 5.0000 mg | ORAL_TABLET | ORAL | 0 refills | Status: DC | PRN

## 2023-05-03 NOTE — ED Triage Notes (Signed)
 Pt c/o left heel painx9d. Pt denies injury. Pt states it worse with walking.

## 2023-05-03 NOTE — ED Provider Notes (Signed)
 [place] EMERGENCY DEPARTMENT AT [facility] Provider Note   CSN: [id number] Arrival date & time: 05/03/23  1128     History  Chief Complaint  Patient presents with   Foot Pain    Melissa Rowe is a 58 y.o. female.  HPI      58 year old female with a history of hyperlipidemia, OSA, hypothyroidism, asthma, meralgia paresthetica, who presents with concern for left heel pain.  Reports the pain began 9 days ago.  Denies any known injuries.  Does not believe she stepped on anything.  Has not had fevers or other systemic symptoms.  The pain has become progressively worse.  It worsens as the day goes on and she walks on it after she walks on it it does appear red.  Past Medical History:  Diagnosis Date   Anemia sept /oct 2014   Arthritis    Asthma    Constipation    Diverticulitis    Dyspnea    with activity   Environmental allergies    Frequency of urination    Gastroenteritis    GERD (gastroesophageal reflux disease)    Glaucoma    Hemorrhoids    external with some bleeding   Hyperlipidemia    Hypothyroidism    Neuropathy    Obesity    OSA (obstructive sleep apnea)    wears CPAP night pt does not know settings   Urgency of urination       Home Medications Prior to Admission medications   Medication Sig Start Date End Date Taking? Authorizing Provider  oxyCODONE (ROXICODONE) 5 MG immediate release tablet Take 1 tablet (5 mg total) by mouth every 4 (four) hours as needed for severe pain (pain score 7-10). 05/03/23  Yes Alvira Monday, MD  albuterol (PROAIR HFA) 108 (90 Base) MCG/ACT inhaler Inhale 2 puffs every 6 hours as needed- rescue 09/12/21   Jetty Duhamel D, MD  Calcium Carb-Cholecalciferol (CALCIUM 600 + D PO) Take 1 tablet by mouth daily.    [provider]  clonazePAM (KLONOPIN) 1 MG tablet TAKE 1 TABLET BY MOUTH AT BEDTIME 04/22/23   Sater, Pearletha Furl, MD  cyclobenzaprine (FLEXERIL) 10 MG tablet Take 1 tablet (10 mg total) by mouth 2  (two) times daily as needed for muscle spasms. 09/22/13   Harle Battiest, NP  gabapentin (NEURONTIN) 800 MG tablet 1 tab 4 times daily 06/27/22   Sater, Pearletha Furl, MD  levothyroxine (SYNTHROID) 150 MCG tablet Take 150 mcg by mouth daily before breakfast. 12/17/22   [provider]  lidocaine (XYLOCAINE) 5 % ointment Apply to right thigh twice a day 02/21/21   Lomax, Amy, NP  modafinil (PROVIGIL) 200 MG tablet Take 1 tablet (200 mg total) by mouth daily. 01/23/23   Ihor Austin, NP  mometasone-formoterol (DULERA) 200-5 MCG/ACT AERO USE 2 INHALATIONS BY MOUTH  TWICE DAILY 09/12/21   Young, Joni Fears D, MD  montelukast (SINGULAIR) 10 MG tablet Take 10 mg by mouth at bedtime.  05/20/12   Kalman Shan, MD  Multiple Vitamin (MULTIVITAMIN) capsule Take 1 capsule by mouth daily.     [provider]  nystatin (MYCOSTATIN/NYSTOP) powder Apply 1 Application topically 2 (two) times daily. 12/17/22   [provider]  Omeprazole-Sodium Bicarbonate (ZEGERID) 20-1100 MG CAPS capsule Take 1 capsule by mouth daily before breakfast.    [provider]  Oxcarbazepine (TRILEPTAL) 300 MG tablet Take 2 tablets (600 mg total) by mouth 2 (two) times daily. 01/21/23   Ihor Austin, NP  oxybutynin (  DITROPAN XL) 15 MG 24 hr tablet Take 15 mg by mouth daily. 11/13/19   [provider]  polyethylene glycol (MIRALAX / GLYCOLAX) packet Take 17 g by mouth daily as needed for moderate constipation.     [provider]  psyllium (METAMUCIL SMOOTH TEXTURE) 28 % packet Take 1 packet by mouth 2 (two) times daily.    [provider]  rosuvastatin (CRESTOR) 10 MG tablet Take 10 mg by mouth at bedtime. 11/13/19   [provider]  tirzepatide Greggory Keen) 12.5 MG/0.5ML Pen Inject 12.5 mg into the skin once a week. 08/13/22     traMADol (ULTRAM) 50 MG tablet Take 100 mg by mouth every 6 (six) hours as needed. 10/08/13   [provider]      Allergies    Lamictal  [lamotrigine], Metformin and related, and Other    Review of Systems   Review of Systems  Physical Exam Updated Vital Signs BP (!) 147/86 (BP Location: Left Arm)   Pulse 84   Temp 97.9 F (36.6 C) (Oral)   Resp 16   Ht 5\' 6"  (1.676 m)   Wt (!) 158.7 kg   LMP 04/06/2015   SpO2 100%   BMI 56.47 kg/m  Physical Exam Vitals and nursing note reviewed.  Constitutional:      General: She is not in acute distress.    Appearance: Normal appearance. She is not ill-appearing, toxic-appearing or diaphoretic.  HENT:     Head: Normocephalic.  Eyes:     Conjunctiva/sclera: Conjunctivae normal.  Cardiovascular:     Rate and Rhythm: Normal rate and regular rhythm.     Pulses: Normal pulses.  Pulmonary:     Effort: Pulmonary effort is normal. No respiratory distress.  Musculoskeletal:        General: Tenderness (left heel severe tenderness. 3mm area thickened skin, no sign of abscess, no erythema) present. No deformity or signs of injury.     Cervical back: No rigidity.     Comments: Normal pulses  Skin:    General: Skin is warm and dry.     Coloration: Skin is not jaundiced or pale.  Neurological:     General: No focal deficit present.     Mental Status: She is alert and oriented to person, place, and time.     ED Results / Procedures / Treatments   Labs (all labs ordered are listed, but only abnormal results are displayed) Labs Reviewed - No data to display  EKG None  Radiology DG Foot Complete Left Result Date: 05/03/2023 CLINICAL DATA:  Left foot pain around the bottom of heel. EXAM: LEFT FOOT - COMPLETE 3+ VIEW COMPARISON:  None Available. FINDINGS: No acute fracture or dislocation. No aggressive osseous lesion. Mild-to-moderate hallux valgus deformity noted. Mild degenerative changes of imaged joints. Calcaneal spur noted along the Plantar aponeurosis attachment site. No focal soft tissue swelling. No radiopaque foreign bodies. IMPRESSION: *No acute osseous abnormality of  the left foot. Electronically Signed   By: Jules Schick M.D.   On: 05/03/2023 12:50    Procedures Procedures    Medications Ordered in ED Medications - No data to display  ED Course/ Medical Decision Making/ A&P                                  58 year old female with a history of hyperlipidemia, OSA, hypothyroidism, asthma, meralgia paresthetica, who presents with concern for left heel pain.  She has normal pulses bilaterally, low suspicion for acute arterial thrombus as etiology of pain.  I do not see signs of abscess, cellulitis on exam and she does not have fever and overall suspicion for osteomyelitis is low.   X-ray completed shows no evidence of fracture.  Does show plantar calcaneal spur which may relate to pain, also consider possibility of non-radiopaque foreign body although no clear signs of infection at this time.  Her presentation is less consistent with plantar fasciitis.  Recommend follow-up with her primary care physician or orthopedics.  Reviewed risks of narcotic medication and reviewed her and the [place] drug database and gave her a short prescription for oxycodone due to the severity of her pain, and given prescription for a [place]. Patient discharged in stable condition with understanding of reasons to return.          Final Clinical Impression(s) / ED Diagnoses Final diagnoses:  Pain of left heel    Rx / DC Orders ED Discharge Orders          Ordered    oxyCODONE (ROXICODONE) 5 MG immediate release tablet  Every 4 hours PRN        05/03/23 1950    Clarin standard        05/03/23 1950              Alvira Monday, MD 05/05/23 [phone]

## 2023-05-06 ENCOUNTER — Telehealth: Payer: Self-pay | Admitting: Orthopaedic Surgery

## 2023-05-06 ENCOUNTER — Other Ambulatory Visit: Payer: Self-pay

## 2023-05-06 DIAGNOSIS — M1712 Unilateral primary osteoarthritis, left knee: Secondary | ICD-10-CM

## 2023-05-06 DIAGNOSIS — M1711 Unilateral primary osteoarthritis, right knee: Secondary | ICD-10-CM

## 2023-05-06 NOTE — Telephone Encounter (Signed)
 Patient called wants to know did you get the hospital message and do she needs to come in for him to look at her hill. CB#[phone]

## 2023-05-06 NOTE — Telephone Encounter (Signed)
Talked with patient and appointment is scheduled for gel injection.

## 2023-05-12 IMAGING — DX DG CHEST 2V
2 series · 2 of 2 positions shown · non-contrast
Comparison: 10/20/2019

CLINICAL DATA: Dyspnea on exertion.

EXAM:
CHEST - 2 VIEW

[chest pa]
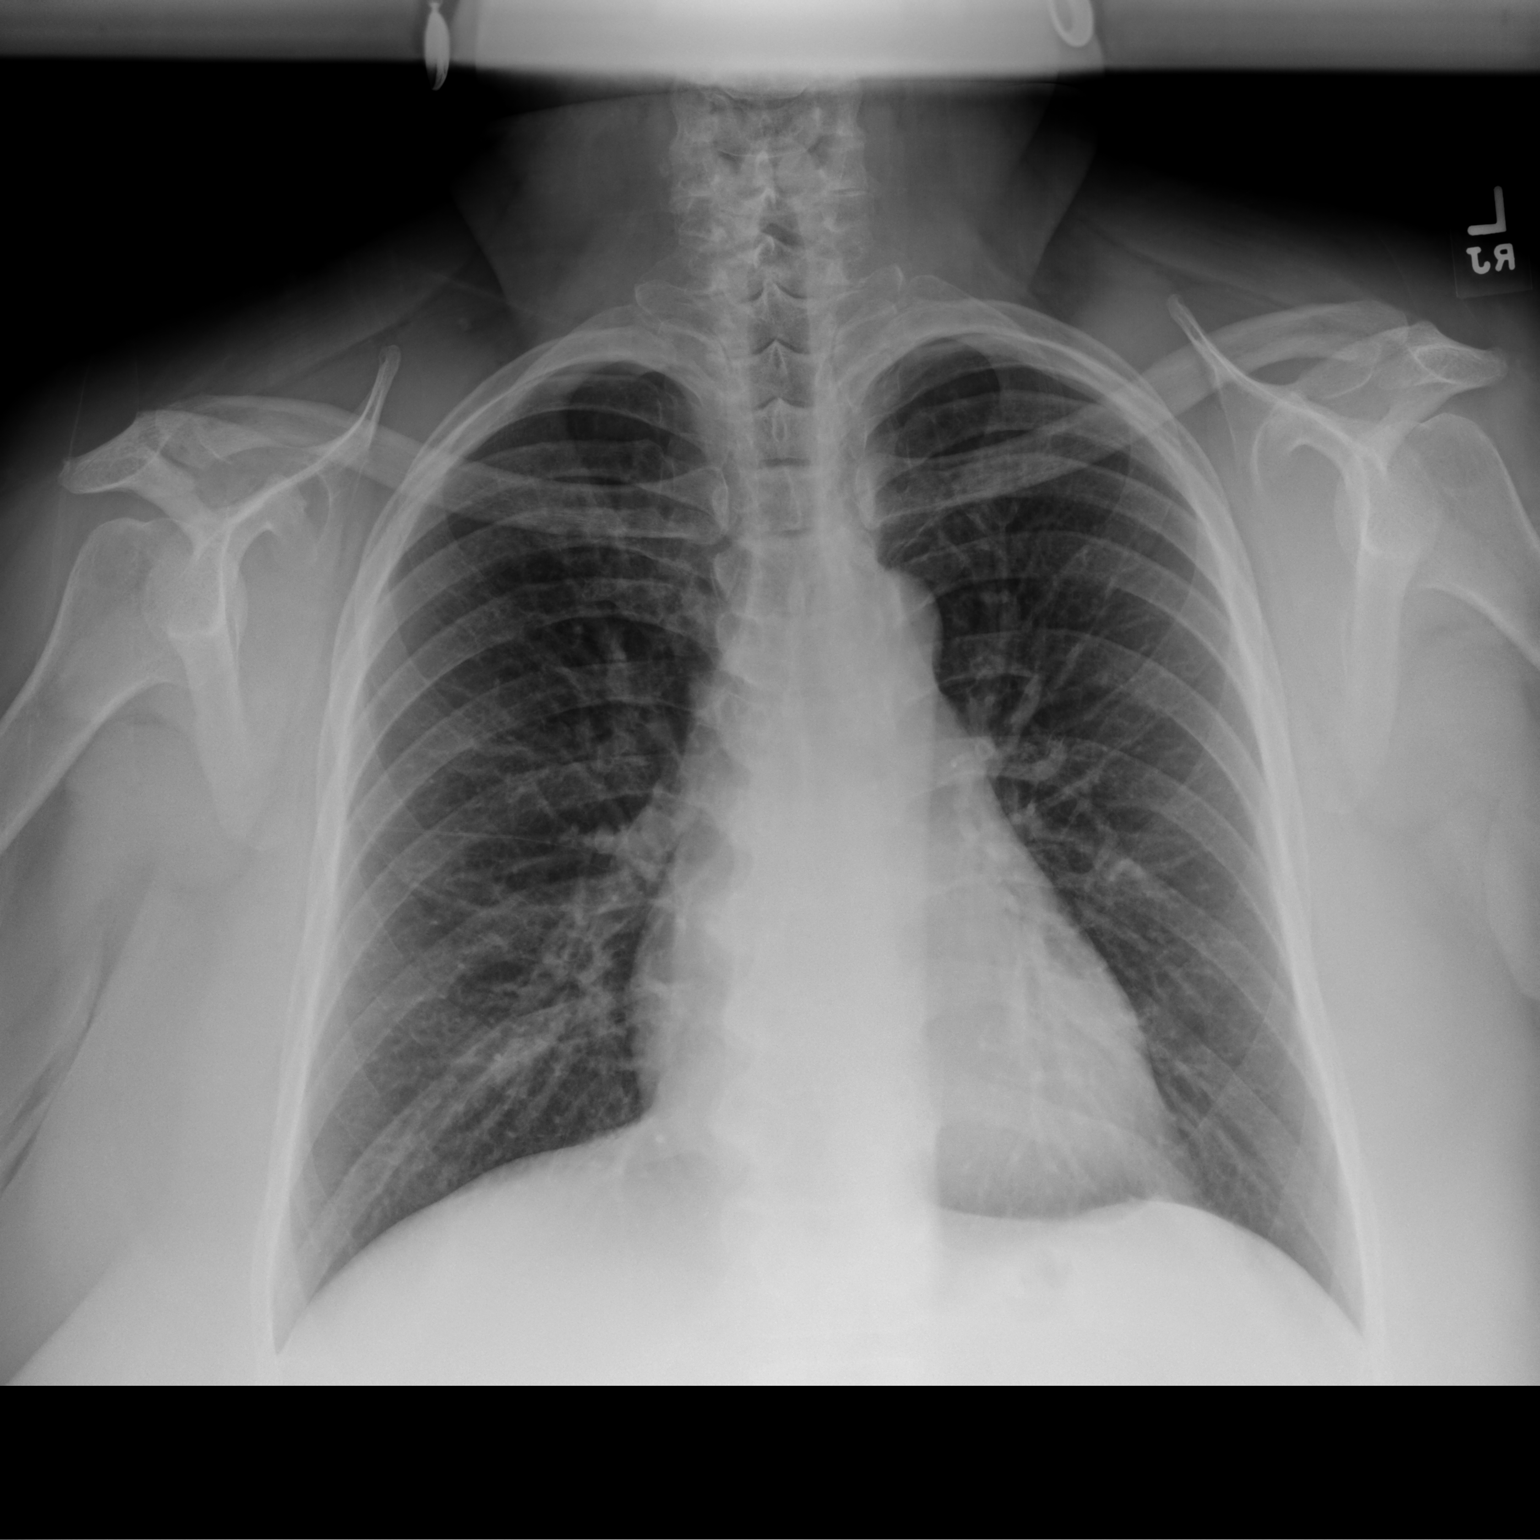

[chest lat]
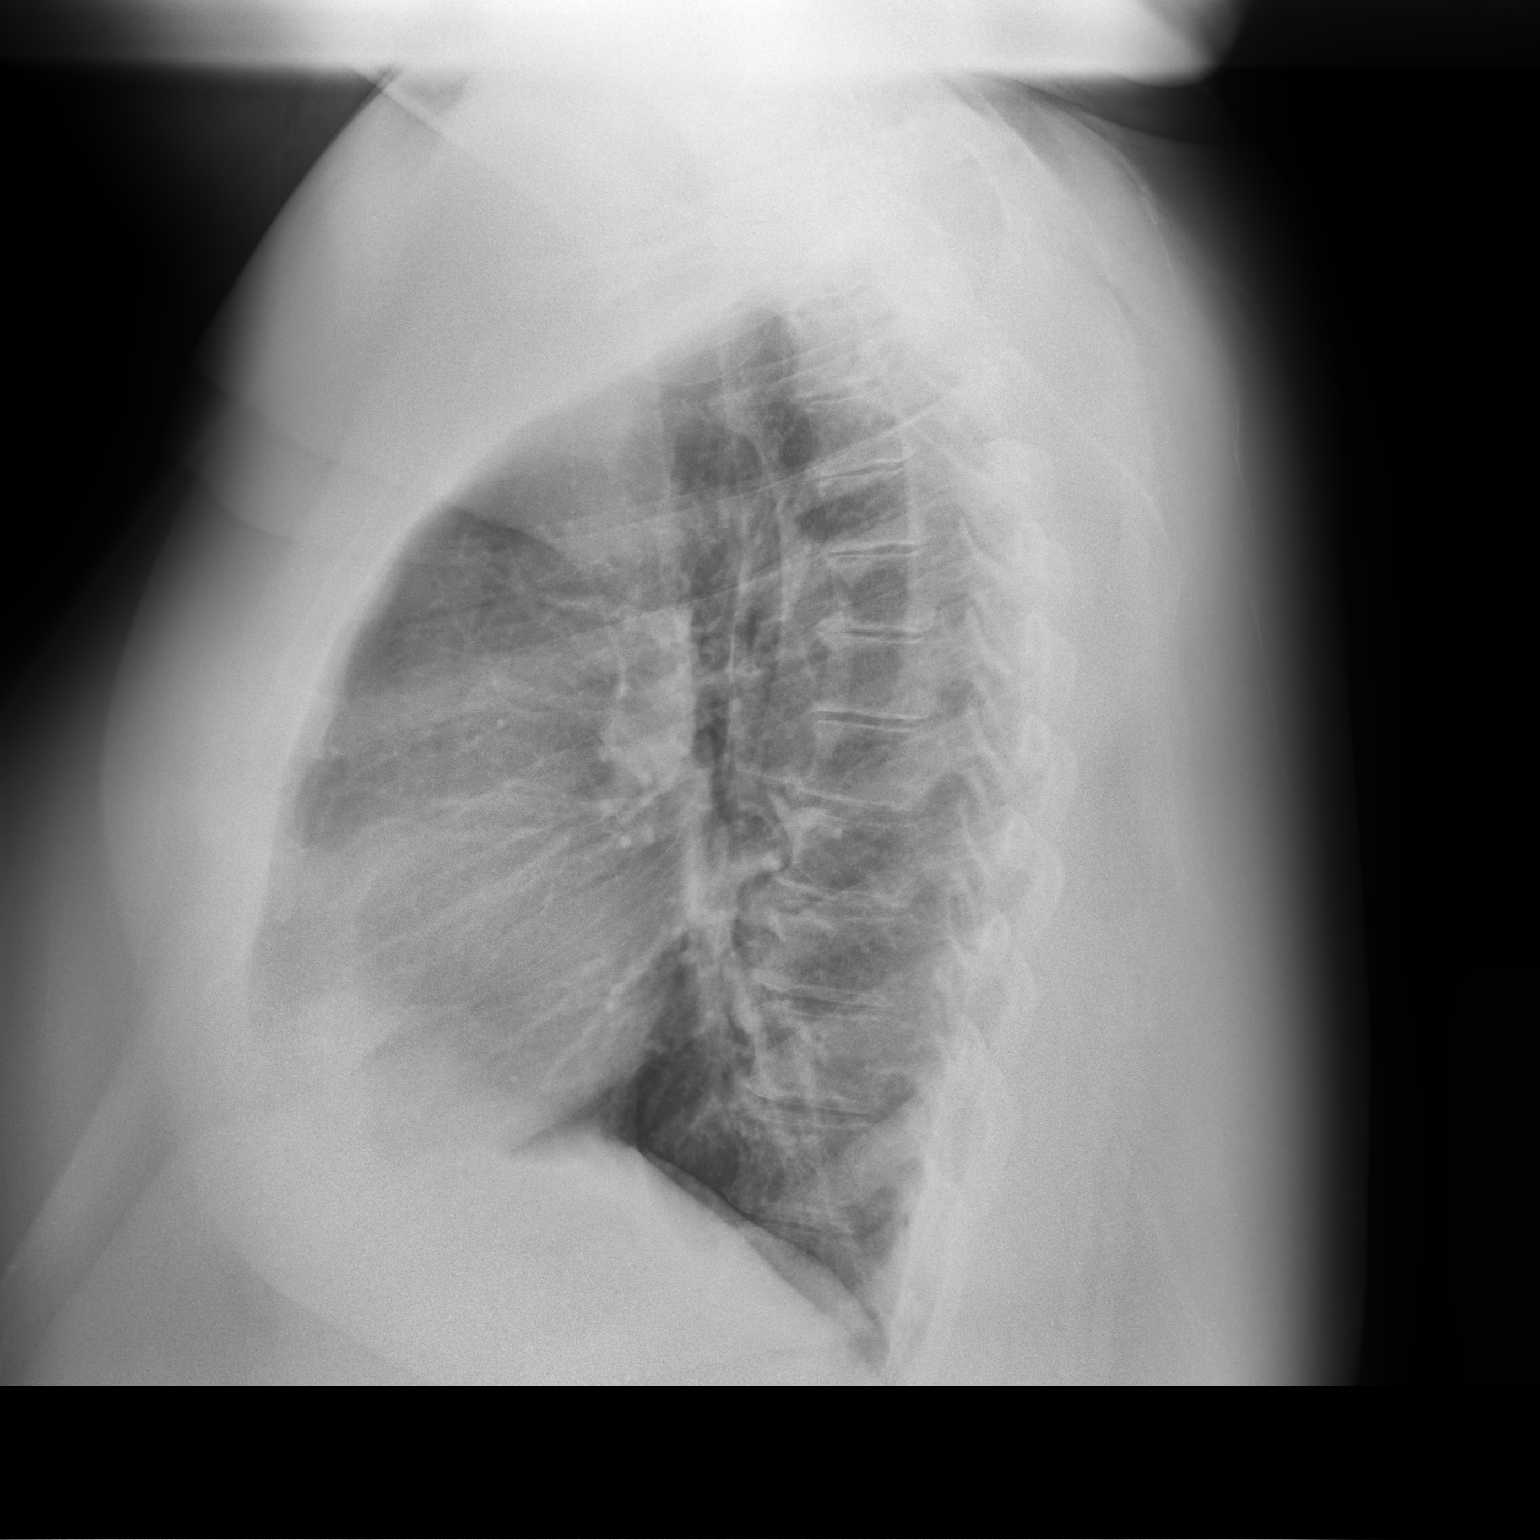

[2 of 2 positions shown; findings below may reference images not displayed]

FINDINGS: The heart size and mediastinal contours are within normal limits.
Both lungs are clear. The visualized skeletal structures are
unremarkable.
IMPRESSION: No active cardiopulmonary disease.

## 2023-05-20 ENCOUNTER — Ambulatory Visit: Admitting: Physician Assistant

## 2023-05-20 ENCOUNTER — Encounter: Payer: Self-pay | Admitting: Physician Assistant

## 2023-05-20 DIAGNOSIS — M170 Bilateral primary osteoarthritis of knee: Secondary | ICD-10-CM | POA: Diagnosis not present

## 2023-05-20 DIAGNOSIS — M1711 Unilateral primary osteoarthritis, right knee: Secondary | ICD-10-CM

## 2023-05-20 DIAGNOSIS — M1712 Unilateral primary osteoarthritis, left knee: Secondary | ICD-10-CM

## 2023-05-20 MED ORDER — SODIUM HYALURONATE 60 MG/3ML IX PRSY
60.0000 mg | PREFILLED_SYRINGE | INTRA_ARTICULAR | Status: AC | PRN
  Administered 2023-05-20: 60 mg via INTRA_ARTICULAR

## 2023-05-20 NOTE — Progress Notes (Signed)
   Procedure Note  Patient: Melissa Rowe             Date of Birth: 20-Apr-1965           MRN: [id number]             Visit Date: 05/20/2023  HPI: Melissa Rowe comes in today for scheduled Durling injections both knees.  She has known osteoarthritis both knees.  She has had no new injury to either knee.  She has no surgery schedule next 6 months on either knee.  Physical exam: Bilateral knees good range of motion.  Tenderness along the medial joint line of both knees.  Patellofemoral crepitus both knees.  No abnormal warmth erythema or effusion of either knee.  Procedures: Visit Diagnoses:  1. Unilateral primary osteoarthritis, left knee   2. Unilateral primary osteoarthritis, right knee     Large Joint Inj: bilateral knee on 05/20/2023 11:15 AM Indications: pain Details: 22 G 1.5 in needle, anterolateral approach  Arthrogram: No  Medications (Right): 60 mg Sodium Hyaluronate 60 MG/3ML Medications (Left): 60 mg Sodium Hyaluronate 60 MG/3ML Outcome: tolerated well, no immediate complications Procedure, treatment alternatives, risks and benefits explained, specific risks discussed. Consent was given by the patient. Immediately prior to procedure a time out was called to verify the correct patient, procedure, equipment, support staff and site/side marked as required. Patient was prepped and draped in the usual sterile fashion.      Plan: She understands to wait at least 6 months between viscosupplementation injections.  She will follow-up with [place] as needed.

## 2023-07-16 ENCOUNTER — Other Ambulatory Visit: Payer: Self-pay | Admitting: Neurology

## 2023-07-16 NOTE — Telephone Encounter (Signed)
 Last seen on 01/21/23 Follow up scheduled on 08/07/23   Dispensed Days Supply Quantity Provider Pharmacy  CLONAZEPAM  1MG       TAB 04/22/2023 90 90 each Sater, Sherida Dimmer, MD [facility] [phone] ...   Rx pending to be signed

## 2023-07-27 ENCOUNTER — Other Ambulatory Visit: Payer: Self-pay | Admitting: Neurology

## 2023-07-30 NOTE — Telephone Encounter (Signed)
 Last seen on 01/21/23 Follow up scheduled on 08/07/23

## 2023-08-01 ENCOUNTER — Other Ambulatory Visit: Payer: Self-pay | Admitting: *Deleted

## 2023-08-01 DIAGNOSIS — J4540 Moderate persistent asthma, uncomplicated: Secondary | ICD-10-CM

## 2023-08-02 ENCOUNTER — Ambulatory Visit: Admitting: Internal Medicine

## 2023-08-02 DIAGNOSIS — J4540 Moderate persistent asthma, uncomplicated: Secondary | ICD-10-CM

## 2023-08-02 LAB — PULMONARY FUNCTION TEST
DL/VA % pred: 125 %
DL/VA: 5.35 ml/min/mmHg/L
DLCO cor % pred: 111 %
DLCO cor: 23.01 ml/min/mmHg
DLCO unc % pred: 111 %
DLCO unc: 23.01 ml/min/mmHg
FEF 25-75 Post: 2.44 L/s
FEF 25-75 Pre: 2.47 L/s
FEF2575-%Change-Post: -1 %
FEF2575-%Pred-Post: 96 %
FEF2575-%Pred-Pre: 98 %
FEV1-%Change-Post: 0 %
FEV1-%Pred-Post: 91 %
FEV1-%Pred-Pre: 92 %
FEV1-Post: 2.42 L
FEV1-Pre: 2.45 L
FEV1FVC-%Change-Post: 3 %
FEV1FVC-%Pred-Pre: 102 %
FEV6-%Change-Post: -4 %
FEV6-%Pred-Post: 87 %
FEV6-%Pred-Pre: 91 %
FEV6-Post: 2.9 L
FEV6-Pre: 3.03 L
FEV6FVC-%Pred-Post: 103 %
FEV6FVC-%Pred-Pre: 103 %
FVC-%Change-Post: -4 %
FVC-%Pred-Post: 85 %
FVC-%Pred-Pre: 89 %
FVC-Post: 2.9 L
FVC-Pre: 3.03 L
Post FEV1/FVC ratio: 84 %
Post FEV6/FVC ratio: 100 %
Pre FEV1/FVC ratio: 81 %
Pre FEV6/FVC Ratio: 100 %
RV % pred: 83 %
RV: 1.59 L
TLC % pred: 88 %
TLC: 4.47 L

## 2023-08-02 NOTE — Patient Instructions (Signed)
 Full PFT performed today.

## 2023-08-02 NOTE — Progress Notes (Signed)
 Full PFT performed today.

## 2023-08-07 ENCOUNTER — Telehealth: Payer: Self-pay | Admitting: Adult Health

## 2023-08-07 ENCOUNTER — Ambulatory Visit: Payer: Medicare Other | Admitting: Neurology

## 2023-08-07 NOTE — Telephone Encounter (Signed)
 Patient unable to come to appointment due feeling sick on stomach with diarrhea and nauseous. Rescheduled appointment and added to the waitlist

## 2023-08-15 ENCOUNTER — Other Ambulatory Visit: Payer: Self-pay | Admitting: Adult Health

## 2023-10-18 ENCOUNTER — Ambulatory Visit: Payer: Medicare Other | Admitting: Internal Medicine

## 2023-10-25 ENCOUNTER — Other Ambulatory Visit: Payer: Self-pay | Admitting: Neurology

## 2023-10-28 NOTE — Telephone Encounter (Signed)
 Last seen on 01/21/23 Follow up scheduled on 03/05/24   Dispensed Days Supply Quantity Provider Pharmacy  CLONAZEPAM  1MG       TAB 07/20/2023 90 90 each Sater, Charlie LABOR, MD [facility] [phone] .     Rx pending to be signed

## 2023-10-31 ENCOUNTER — Other Ambulatory Visit: Payer: Self-pay | Admitting: Neurology

## 2023-10-31 NOTE — Telephone Encounter (Signed)
 Last seen on 01/21/23 Follow up scheduled on 03/05/24

## 2023-11-11 ENCOUNTER — Other Ambulatory Visit: Payer: Self-pay | Admitting: Medical Genetics

## 2023-11-15 ENCOUNTER — Other Ambulatory Visit: Payer: Self-pay | Admitting: Radiology

## 2023-11-15 DIAGNOSIS — M1711 Unilateral primary osteoarthritis, right knee: Secondary | ICD-10-CM

## 2023-11-15 DIAGNOSIS — M1712 Unilateral primary osteoarthritis, left knee: Secondary | ICD-10-CM

## 2023-11-20 ENCOUNTER — Ambulatory Visit: Admitting: Physician Assistant

## 2023-11-20 ENCOUNTER — Encounter: Payer: Self-pay | Admitting: Physician Assistant

## 2023-11-20 DIAGNOSIS — M1711 Unilateral primary osteoarthritis, right knee: Secondary | ICD-10-CM

## 2023-11-20 DIAGNOSIS — M170 Bilateral primary osteoarthritis of knee: Secondary | ICD-10-CM | POA: Diagnosis not present

## 2023-11-20 DIAGNOSIS — M1712 Unilateral primary osteoarthritis, left knee: Secondary | ICD-10-CM

## 2023-11-20 MED ORDER — SODIUM HYALURONATE 60 MG/3ML IX PRSY
60.0000 mg | PREFILLED_SYRINGE | INTRA_ARTICULAR | Status: AC | PRN
  Administered 2023-11-20: 60 mg via INTRA_ARTICULAR

## 2023-11-20 MED ORDER — LIDOCAINE HCL 1 % IJ SOLN
3.0000 mL | INTRAMUSCULAR | Status: AC | PRN
  Administered 2023-11-20: 3 mL

## 2023-11-20 NOTE — Progress Notes (Signed)
   Procedure Note  Patient: Melissa Rowe             Date of Birth: [birth year]           MRN: [id number]             Visit Date: 11/20/2023  HPI Melissa Rowe comes in today for scheduled Durolane injections both knees.  She has had no new injury to either knee.  She has known osteoarthritis both knees.  She ambulates with a cane.  She has difficulty ambulating.  She has tried conservative measures which have included steroid injections, exercise, over-the-counter medications.  She has no scheduled surgery on either knee in the next 6 months.  Physical exam: Bilateral knees good range of motion of both knees.  Patellofemoral crepitus bilaterally.  No abnormal warmth erythema of either knee.  Positive effusion bilaterally.  Procedures: Visit Diagnoses:  1. Unilateral primary osteoarthritis, left knee   2. Unilateral primary osteoarthritis, right knee     Large Joint Inj: bilateral knee on 11/20/2023 10:54 AM Indications: pain Details: 22 G 1.5 in needle, superolateral approach  Arthrogram: No  Medications (Right): 3 mL lidocaine  1 %; 60 mg Sodium Hyaluronate 60 MG/3ML Aspirate (Right): 12 mL yellow and blood-tinged Medications (Left): 3 mL lidocaine  1 %; 60 mg Sodium Hyaluronate 60 MG/3ML Aspirate (Left): 13 mL yellow Outcome: tolerated well, no immediate complications Procedure, treatment alternatives, risks and benefits explained, specific risks discussed. Consent was given by the patient. Immediately prior to procedure a time out was called to verify the correct patient, procedure, equipment, support staff and site/side marked as required. Patient was prepped and draped in the usual sterile fashion.      Plan: She knows to wait 6 months between gel injections.  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  Follow-up as needed.

## 2023-11-28 ENCOUNTER — Other Ambulatory Visit

## 2023-12-04 NOTE — Progress Notes (Deleted)
 HPI F former smoker followed for OSA, Asthma, complicated by  REM Behavior Disorder (Neurology),  Anemia, Arthritis, Asthma, Degenerative Disk Disease, EDS, GERD, Glaucoma, Hypothyroid, Morbid Obesity,  Dyspnea due to Obesity and Asthma -> 05/05/2008: Spirometry suggests restriction. CXR 04/19/2008  Normal. CT 02/10/2008  - Neg for PE. No infiltrates -> Methacholine Challenge Test: 06/25/2008: Positive PC20 between 1-4  - 10/11/2009: fev1 2L/70% -> start pulmicort  -11/08/2009: fev1 2.24L/78% and better - > switch to QVAR due to cost - May 2012: fev1 2.32L/85% and normal but symptomactic -> change QVAR to dulera  sample,  6day pred burst - Jul 13, 2010: Fev1 2.5L/94%, FVC 3.21/98% and improved -> continue dulera  -10/26/2010 FEV1 2.33 L/87%, FVC 2.87 L/88% >cont on dulera   - med calendar 10/26/10  NPSG 08/19/08- AHI 2.9/hr, REM, desaturation to 92%, body weight 328 lbs. HST 07/08/2018- AHI 9.2/ hr, desaturation to 84%, body weight 371 lbs PFT 620/25- WNL except relatively increased DLCO --------------------------------------------------------------------   09/12/21- 56 yoF former smoker (14 pkyrs) followed for OSA, Asthma, complicated by REM Behavior Disorder (Neurology),  Anemia, Arthritis, Asthma, Degenerative Disk Disease, EDS, GERD, Glaucoma, Hypothyroid, Morbid Obesity,  Covid vax-1 J&J, 3 Moderna CPAP 5-20/ [employer]   AirSense 10 AutoSet Neurology prescribes clonazepam  for RBD, modafinil  for EDS. -Dulera  200, Neb albuterol , Proair  hfa, Singulair ,  Download-compliance 97%, AHI 3/ hr Body weight today-362 lbs -----Follow-up: CPAP and coughing green mucus. Responded to antibiotic from Dr Loreli in June. Since then coughed up small amt scant green mucus once, but feels well. Avoiding outdoor heat. Download reviewed. Comfortable with her CPAP.  CXR 06/13/21 FINDINGS: The heart size and mediastinal contours are within normal limits. Both lungs are clear. The visualized skeletal structures  are unremarkable. IMPRESSION: No active cardiopulmonary disease.  12/04/23- 58 yoF former smoker (14 pkyrs) followed for OSA, Asthma, complicated by REM Behavior Disorder (Neurology),  Anemia, Arthritis, Asthma, Degenerative Disk Disease, EDS, GERD, Glaucoma, Hypothyroid, Morbid Obesity,  Covid vax-1 J&J, 3 Moderna CPAP 5-20/ [employer]   AirSense 10 AutoSet Neurology prescribes clonazepam  for RBD, modafinil  for EDS. -Dulera  200, Neb albuterol , Proair  hfa, Singulair ,  Download-compliance  Body weight today- PFT 620/25- WNL except relatively increased DLCO   ROS-see HPI  + = positive Constitutional:    weight loss, night sweats, fevers, chills,+ fatigue, lassitude. HEENT:    headaches, difficulty swallowing, tooth/dental problems, sore throat,       sneezing, itching, ear ache, nasal congestion, post nasal drip, snoring CV:    chest pain, orthopnea, PND, swelling in lower extremities, anasarca,                                   dizziness, palpitations Resp:  + shortness of breath with exertion or at rest.                productive cough,   +non-productive cough, coughing up of blood.              change in color of mucus.  wheezing.   Skin:    rash or lesions. GI:  No-   heartburn, indigestion, abdominal pain, nausea, vomiting, diarrhea,                 change in bowel habits, loss of appetite GU: dysuria, change in color of urine, no urgency or frequency.   flank pain. MS:   joint pain, stiffness, decreased range of motion, back pain. Neuro-  nothing unusual Psych:  change in mood or affect.  depression or anxiety.   memory loss.  OBJ- Physical Exam General- Alert, Oriented, Affect-appropriate, Distress- none acute, + morbidly obese Skin- rash-none, lesions- none, excoriation- none Lymphadenopathy- none Head- atraumatic            Eyes- Gross vision intact, PERRLA, conjunctivae and secretions clear            Ears- Hearing, canals-normal            Nose- Clear, no-Septal dev,  mucus, polyps, erosion, perforation             Throat- Mallampati II-III, mucosa clear , drainage- none, tonsils- atrophic,  + teeth Neck- flexible , trachea midline, no stridor , thyroid  nl, carotid no bruit Chest - symmetrical excursion , unlabored           Heart/CV- RRR , no murmur , no gallop  , no rub, nl s1 s2                           - JVD- none , edema- none, stasis changes- none, varices- none           Lung- clear to P&A, wheeze- none, cough-none, dullness-none, rub- none +very talkative without respiratory effort or distress           Chest wall-  Abd-  Br/ Gen/ Rectal- Not done, not indicated Extrem- cyanosis- none, clubbing, none, atrophy- none, strength- nl    + cane Neuro- grossly intact to observation

## 2023-12-05 ENCOUNTER — Ambulatory Visit: Admitting: Internal Medicine

## 2023-12-05 DIAGNOSIS — J309 Allergic rhinitis, unspecified: Secondary | ICD-10-CM

## 2023-12-05 DIAGNOSIS — J4540 Moderate persistent asthma, uncomplicated: Secondary | ICD-10-CM

## 2023-12-05 DIAGNOSIS — G4733 Obstructive sleep apnea (adult) (pediatric): Secondary | ICD-10-CM

## 2023-12-10 ENCOUNTER — Encounter: Payer: Self-pay | Admitting: Neurology

## 2023-12-10 ENCOUNTER — Ambulatory Visit (INDEPENDENT_AMBULATORY_CARE_PROVIDER_SITE_OTHER): Admitting: Neurology

## 2023-12-10 VITALS — BP 133/88 | HR 98 | Ht 66.0 in | Wt 366.0 lb

## 2023-12-10 DIAGNOSIS — G4733 Obstructive sleep apnea (adult) (pediatric): Secondary | ICD-10-CM

## 2023-12-10 DIAGNOSIS — G5711 Meralgia paresthetica, right lower limb: Secondary | ICD-10-CM | POA: Diagnosis not present

## 2023-12-10 DIAGNOSIS — G4752 REM sleep behavior disorder: Secondary | ICD-10-CM

## 2023-12-10 DIAGNOSIS — G4719 Other hypersomnia: Secondary | ICD-10-CM | POA: Diagnosis not present

## 2023-12-10 DIAGNOSIS — Z79899 Other long term (current) drug therapy: Secondary | ICD-10-CM

## 2023-12-10 MED ORDER — OXCARBAZEPINE 300 MG PO TABS
600.0000 mg | ORAL_TABLET | Freq: Two times a day (BID) | ORAL | 3 refills | Status: AC
Start: 2023-12-10 — End: ?

## 2023-12-10 MED ORDER — MODAFINIL 200 MG PO TABS: 200.0000 mg | ORAL_TABLET | Freq: Every day | ORAL | 1 refills | Status: AC

## 2023-12-10 NOTE — Progress Notes (Signed)
 [facility]  PATIENT: Melissa Rowe DOB: [birth year]   REFERRING CLINICIAN: Dr. Loreli HISTORY FROM: Patient   REASON FOR VISIT: Lateral thigh pain   HISTORICAL  CHIEF COMPLAINT:  Chief Complaint  Patient presents with   Follow-up    Pt in room 10. Brother in room.  Here for Meralgia paresthetica follow up.    HISTORY OF PRESENT ILLNESS:  Melissa Rowe is a 58 y.o. woman with right anterolateral thigh numbness and pain due to a Lateral femoral cutaneous neuropathy and a sleep disorder.     Update  12/10/2023 She is having numbness and mild dysesthesias in the distribution of the right LFCN in the anterolateral thigh.  The medications help a lot and pain much better than in 2018.  She has allodynia over the same skin patch.    She had a LFCN injection by Pain Management ([facility] [place]) in 2024 that helped x 8-9 months.     She has had a few active dreams with fighting kicking/punching but these are generally better on clonazepam  1 mg .  She had an especially bad one a few weeks ago but most of them are mild.   She still talks in her sleep but the more active dreams are much more rare.    She has OSA and uses CPAP nightly.   Her EDS is better with modafinil  .  She does one short nap a day  and feels better afterwards.     She has no cataplexy.     EPWORTH SLEEPINESS SCALE (On Modafinil )  On a scale of 0 - 3 what is the chance of dozing:  Sitting and Reading:   1 Watching TV:    3 Sitting inactive in a public place: 1 Passenger in car for one hour: 0 Lying down to rest in the afternoon: 3 Sitting and talking to someone: 1 Sitting quietly after lunch:  0 In a car, stopped in traffic:   Does not drive   Total (out of 24):      9/24    minimal EDS  (was 16/24 before modafinil  )     Earing a boot for plantar fasciitis on left  LFCN History:   She had breast reduction surgery on November 4th and she reports that the right thigh was 'going to  sleep' with numbness and tingling later that night.    Over the next week it got more painful and by December, the pain was very severe.  Pain was apparently so bad that she was screaming out in the middle of the night. She describes a gnawing pain that is constant but also has a stabbing pain frequently.   She was worked in at her Primary care provider's office, Dr. Loreli, and was diagnosed with right meralgia paresthetica.   Lyrica was started.  She saw neurosurgery and the Lyrica dose was increased from 75  to 150 mg twice a day with only minimal relief. She returned to her primary care provider's office on 02/16/2014 and tramadol  was added.    Of note, she gained about 40 pounds during the 4 weeks between her two primary care visits.   On the higher dose of Lyrica, the stabbing pain was helped but the more annoying constant pain was not. She was switched from Lyrica to gabapentin  with benefit (02/2014).      Lamotrigine  was not tolerated (added 03/2014).   Lidocaine  ointment helps some (added 2/20165).    Oxcabazepine added 09/2014 with  improvement, dose slwowly titrated to 600 mg po bid.  around May 2017 she had a procedure [facility] with improvement of the anterior half of the painful region but not the lateral half.SABRA        REVIEW OF SYSTEMS:  Constitutional: No fevers, chills, sweats, or change in appetite Eyes: No visual changes, double vision, eye pain Ear, nose and throat: No hearing loss, ear pain, nasal congestion, sore throat Cardiovascular: No chest pain, palpitations Respiratory:  No shortness of breath at rest or with exertion.   No wheezes.    She has OSA and is on CPAP. GastrointestinaI: No nausea, vomiting, diarrhea, abdominal pain, fecal incontinence Genitourinary:  No dysuria, urinary retention or frequency.  No nocturia. Musculoskeletal:  No neck pain, some back pain, moderate knee pain   Integumentary: No rash, pruritus, skin lesions Neurological: as  above  ALLERGIES: Allergies  Allergen Reactions   Lamictal  [Lamotrigine ] Itching    Migraine    Metformin And Related     Diarrhea, fatigue   Other Rash    Powder inside latex gloves - rash and sores     HOME MEDICATIONS: Outpatient Medications Prior to Visit  Medication Sig Dispense Refill   albuterol  (PROAIR  HFA) 108 (90 Base) MCG/ACT inhaler Inhale 2 puffs every 6 hours as needed- rescue 54 g 4   Calcium Carb-Cholecalciferol (CALCIUM 600 + D PO) Take 1 tablet by mouth daily.     clonazePAM  (KLONOPIN ) 1 MG tablet TAKE 1 TABLET BY MOUTH AT BEDTIME 90 tablet 1   cyclobenzaprine  (FLEXERIL ) 10 MG tablet Take 1 tablet (10 mg total) by mouth 2 (two) times daily as needed for muscle spasms. 20 tablet 0   gabapentin  (NEURONTIN ) 800 MG tablet Take 1 tablet by mouth 4 times daily 360 tablet 1   levothyroxine  (SYNTHROID ) 150 MCG tablet Take 150 mcg by mouth daily before breakfast.     lidocaine  (XYLOCAINE ) 5 % ointment Apply to right thigh twice a day 100 g 3   modafinil  (PROVIGIL ) 200 MG tablet Take 1 tablet by mouth once daily 90 tablet 0   mometasone -formoterol  (DULERA ) 200-5 MCG/ACT AERO USE 2 INHALATIONS BY MOUTH  TWICE DAILY 39 g 3   montelukast  (SINGULAIR ) 10 MG tablet Take 10 mg by mouth at bedtime.      Multiple Vitamin (MULTIVITAMIN) capsule Take 1 capsule by mouth daily.      nystatin (MYCOSTATIN/NYSTOP) powder Apply 1 Application topically 2 (two) times daily.     Omeprazole-Sodium Bicarbonate (ZEGERID) 20-1100 MG CAPS capsule Take 1 capsule by mouth daily before breakfast.     Oxcarbazepine  (TRILEPTAL ) 300 MG tablet Take 2 tablets (600 mg total) by mouth 2 (two) times daily. 360 tablet 3   oxybutynin (DITROPAN XL) 15 MG 24 hr tablet Take 15 mg by mouth daily.     polyethylene glycol (MIRALAX  / GLYCOLAX ) packet Take 17 g by mouth daily as needed for moderate constipation.      psyllium (METAMUCIL SMOOTH TEXTURE) 28 % packet Take 1 packet by mouth 2 (two) times daily.      rosuvastatin (CRESTOR) 10 MG tablet Take 10 mg by mouth at bedtime.     traMADol  (ULTRAM ) 50 MG tablet Take 100 mg by mouth every 6 (six) hours as needed.     oxyCODONE  (ROXICODONE ) 5 MG immediate release tablet Take 1 tablet (5 mg total) by mouth every 4 (four) hours as needed for severe pain (pain score 7-10). 12 tablet 0   tirzepatide  (MOUNJARO ) 12.5  MG/0.5ML Pen Inject 12.5 mg into the skin once a week. 2 mL 11   No facility-administered medications prior to visit.    PAST MEDICAL HISTORY: Past Medical History:  Diagnosis Date   Anemia sept /oct 2014   Arthritis    Asthma    Constipation    Diverticulitis    Dyspnea    with activity   Environmental allergies    Frequency of urination    Gastroenteritis    GERD (gastroesophageal reflux disease)    Glaucoma    Hemorrhoids    external with some bleeding   Hyperlipidemia    Hypothyroidism    Neuropathy    Obesity    OSA (obstructive sleep apnea)    wears CPAP night pt does not know settings   Urgency of urination     PAST SURGICAL HISTORY: Past Surgical History:  Procedure Laterality Date   BREAST BIOPSY Left 12/2016   fibroadenoma   BREAST REDUCTION SURGERY Bilateral 12/16/2013   Procedure: BILATERAL BREAST REDUCTION;  Surgeon: Estefana Reichert, DO;  Location: [facility] OR;  Service: Plastics;  Laterality: Bilateral;   BREAST REDUCTION SURGERY Bilateral    CARPAL TUNNEL RELEASE     right   CARPAL TUNNEL RELEASE Right    CARPAL TUNNEL RELEASE Left 10/10/2018   Procedure: LEFT CARPAL TUNNEL RELEASE;  Surgeon: Vernetta Lonni GRADE, MD;  Location: [facility] ORS;  Service: Orthopedics;  Laterality: Left;   CHOLECYSTECTOMY  2008   CHOLECYSTECTOMY     COLONOSCOPY     COLONOSCOPY WITH PROPOFOL  N/A 02/24/2013   Procedure: COLONOSCOPY WITH PROPOFOL ;  Surgeon: Gwendlyn ONEIDA Buddy, MD;  Location: [facility] ENDOSCOPY;  Service: Endoscopy;  Laterality: N/A;   KNEE ARTHROSCOPY  03/18/2012   Procedure: ARTHROSCOPY KNEE;  Surgeon: Lonni GRADE Vernetta, MD;   Location: [facility] OR;  Service: Orthopedics;  Laterality: Right;  Right knee arthroscopy with debridement and three compartment chondroplasty   polyps removal  2007   REDUCTION MAMMAPLASTY     TUBAL LIGATION  1992    FAMILY HISTORY: Family History  Problem Relation Age of Onset   Heart murmur Mother        rheumatoid arthritis   Heart attack Mother    COPD Mother    Diabetes Mother    Breast cancer Mother    Prostate cancer Father    Kidney failure Father    Asthma Brother        and mother both as a child    SOCIAL HISTORY:  Social History   Socioeconomic History   Marital status: Married    Spouse name: Not on file   Number of children: 2   Years of education: Not on file   Highest education level: Not on file  Occupational History   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, Jewellery: UNEMPLOYED  Tobacco Use   Smoking status: Former    Current packs/day: 0.00    Average packs/day: 2.0 packs/day for 7.0 years (14.0 ttl pk-yrs)    Types: Cigarettes    Start date: 02/13/1983    Quit date: 02/12/1990    Years since quitting: 33.8   Smokeless tobacco: Never  Vaping Use   Vaping status: Never Used  Substance and Sexual Activity   Alcohol use: Not Currently    Comment: rare   Drug use: No   Sexual activity: Not Currently  Other Topics Concern   Not on file  Social History Narrative   Not on file  Social Drivers of Corporate Investment Banker Strain: Not on file  Food Insecurity: Not on file  Transportation Needs: Not on file  Physical Activity: Not on file  Stress: Not on file  Social Connections: Not on file  Intimate Partner Violence: Not on file     PHYSICAL EXAM  Vitals:   12/10/23 1514  BP: 133/88  Pulse: 98  Weight: (!) 366 lb (166 kg)  Height: 5' 6 (1.676 m)    Body mass index is 59.07 kg/m.   General: The patient Is an obese woman in no acute distress.   Neurologic Exam  Mental status: The patient is  alert and oriented x 3 at the time of the examination. The patient has apparent normal recent and remote memory, with an apparently normal attention span and concentration ability.   Speech is normal.  Cranial nerves: Extraocular movements are full.  Facial strength is normal.  Trapezius and sternocleidomastoid strength is normal. No dysarthria is noted.     Motor:  Muscle bulk and tone are normal. Strength is  5 / 5 in all 4 extremities, including left hand  Sensory:   Reduced sensation to touch in the anterolateral right thigh. Mild allodynia and hyperpathia in this distribution,.  She has normal sensation to touch elsewhere..  Gait and station: Station is normal but the gait is arthritic with a reduced stride.  Also wearing a left boot for plantar fasciitis.  Reflexes: Deep tendon reflexes are symmetric and normal bilaterally.      DIAGNOSTIC DATA (LABS, IMAGING, TESTING) - I reviewed patient records, labs, notes, testing and imaging myself where available.  Lab Results  Component Value Date   WBC 7.3 01/28/2019   HGB 12.8 01/28/2019   HCT 41.5 01/28/2019   MCV 68.0 (L) 01/28/2019   PLT 409 (H) 01/28/2019      Component Value Date/Time   NA 144 06/27/2022 1159   K 3.9 06/27/2022 1159   CL 104 06/27/2022 1159   CO2 23 06/27/2022 1159   GLUCOSE 105 (H) 06/27/2022 1159   GLUCOSE 104 (H) 01/28/2019 1628   BUN 8 06/27/2022 1159   CREATININE 0.85 06/27/2022 1159   CALCIUM 9.4 06/27/2022 1159   PROT 7.5 06/27/2022 1159   ALBUMIN 4.4 06/27/2022 1159   AST 21 06/27/2022 1159   ALT 17 06/27/2022 1159   ALKPHOS 109 06/27/2022 1159   BILITOT 0.2 06/27/2022 1159   GFRNONAA >60 01/28/2019 1628   GFRAA >60 01/28/2019 1628   Lab Results  Component Value Date   CHOL 209 (H) 09/22/2009   HDL 62 09/22/2009   LDLCALC 116 (H) 09/22/2009   TRIG 156 (H) 09/22/2009   CHOLHDL 3.4 Ratio 09/22/2009   Lab Results  Component Value Date   HGBA1C 5.7 (H) 12/16/2013   No results found  for: VITAMINB12 Lab Results  Component Value Date   TSH 6.759 (H) 01/11/2009      ASSESSMENT AND PLAN  Meralgia paresthetica of right side  REM behavioral disorder  Excessive daytime sleepiness  OSA on CPAP  High risk medication use   1.  She has more right LFCN pain.  We will continue the oxcarbazepine  , gabapentin  and lidocaine  ointment for the lateral femoral cutaneous neuropathy.   Since pain has intensified again we will refer back to the [facility] Institute in [place] for another LFCN injection 2.   Continue clonazepam  1 mg for the REM behavior disorder.  Add Melatonin 5 mg at night.  She is on CPAP (  followed by pulmonology) and I have prescribed modafinil  for excessive daytime sleepiness with benefit 3.   She will return to see us  in 6 months or sooner if there are new or worsening neurologic symptoms  This visit is part of a comprehensive longitudinal care medical relationship regarding the patients primary diagnosis of LFCN/dysesthesia and related concerns.   Jaquala Fuller A. Vear, MD, PhD 12/10/2023, 3:42 PM Certified in Neurology, Clinical Neurophysiology, Sleep Medicine, Pain Medicine and Neuroimaging  [facility] [address] [place], [place] [address] [phone]

## 2023-12-11 ENCOUNTER — Telehealth: Payer: Self-pay | Admitting: Neurology

## 2023-12-11 NOTE — Telephone Encounter (Signed)
Referral for pain clinic fax to [facility]. Phone: [phone], Fax: [phone].

## 2023-12-16 ENCOUNTER — Encounter: Payer: Self-pay | Admitting: Radiology

## 2023-12-19 ENCOUNTER — Ambulatory Visit: Admitting: Physician Assistant

## 2023-12-19 ENCOUNTER — Other Ambulatory Visit: Payer: Self-pay

## 2023-12-19 ENCOUNTER — Encounter: Payer: Self-pay | Admitting: Physician Assistant

## 2023-12-19 DIAGNOSIS — M25512 Pain in left shoulder: Secondary | ICD-10-CM

## 2023-12-19 MED ORDER — LIDOCAINE HCL 1 % IJ SOLN
3.0000 mL | INTRAMUSCULAR | Status: AC | PRN
  Administered 2023-12-19: 3 mL

## 2023-12-19 MED ORDER — METHYLPREDNISOLONE ACETATE 40 MG/ML IJ SUSP
40.0000 mg | INTRAMUSCULAR | Status: AC | PRN
  Administered 2023-12-19: 40 mg via INTRA_ARTICULAR

## 2023-12-19 NOTE — Progress Notes (Signed)
 HPI: Melissa Rowe comes in today due to left shoulder pain.  She reports that she has had 2 events involving her shoulder.  1 in which she was lifting a scooter and felt a pop in the shoulder several weeks ago.  Then a week ago she caught her shoulder on what sounds like a chain-link fence.  She has had decreased range of motion left shoulder and a popping sensation in the shoulder.  She states that shoulder pain overall is improving but is slowly getting better.  Reports borderline diabetes.  Denies any fevers chills  Physical exam: General bilateral shoulders 5-5 strength external and internal rotation against resistance.  Empty can test is negative bilaterally.  Liftoff test negative bilaterally.  Impingement testing is positive on the left.  Tenderness of the left shoulder only in the bicipital groove region.  She has an area of ecchymosis about the left biceps muscle belly.  Minimal tenderness.  Bilateral biceps triceps strength are 5 out of 5 because no pain.  Nontender over the distal biceps tendon which is intact.  Radiographs: 3 views left shoulder: Shoulder is well located.  Mild AC joint changes.  No acute fractures acute findings.  Glenohumeral joint overall appears well-preserved.  Films are somewhat degraded due to patient's size.  Impression: Acute left shoulder pain Differential left shoulder impingement syndrome versus biceps injury.  Plan: Offered her a subacromial injection and she is agreeable.  Also she is given shoulder exercises to begin warming on the left shoulder.  Will see her back in just 2 weeks see how she is doing overall.  Questions were encouraged and answered at length.    Procedure Note  Patient: Melissa Rowe             Date of Birth: Apr 08, 1965           MRN: [id number]             Visit Date: 12/19/2023  Procedures: Visit Diagnoses:  1. Acute pain of left shoulder     Large Joint Inj: L subacromial bursa on 12/19/2023 2:38 PM Indications: pain Details:  22 G 1.5 in needle, superior approach  Arthrogram: No  Medications: 3 mL lidocaine  1 %; 40 mg methylPREDNISolone  acetate 40 MG/ML Outcome: tolerated well, no immediate complications Procedure, treatment alternatives, risks and benefits explained, specific risks discussed. Consent was given by the patient. Immediately prior to procedure a time out was called to verify the correct patient, procedure, equipment, support staff and site/side marked as required. Patient was prepped and draped in the usual sterile fashion.

## 2023-12-25 ENCOUNTER — Other Ambulatory Visit

## 2024-01-01 ENCOUNTER — Encounter: Payer: Self-pay | Admitting: Internal Medicine

## 2024-01-06 ENCOUNTER — Ambulatory Visit: Admitting: Physician Assistant

## 2024-03-03 ENCOUNTER — Encounter: Payer: Self-pay | Admitting: Gastroenterology

## 2024-03-05 ENCOUNTER — Ambulatory Visit: Admitting: Neurology

## 2024-03-09 ENCOUNTER — Ambulatory Visit: Admitting: Physician Assistant

## 2024-03-13 ENCOUNTER — Encounter: Payer: Self-pay | Admitting: Radiology

## 2024-03-16 ENCOUNTER — Ambulatory Visit: Admitting: Physician Assistant

## 2024-03-17 ENCOUNTER — Other Ambulatory Visit: Payer: Self-pay | Admitting: Neurology

## 2024-03-17 MED ORDER — CLONAZEPAM 1 MG PO TABS: 1.0000 mg | ORAL_TABLET | Freq: Every day | ORAL | 1 refills | Status: AC

## 2024-03-17 NOTE — Telephone Encounter (Signed)
 Pt called stating she has to change Pharmacy  and need to get medication e scribe to  new Pharmacy   [email or web address]  (KLONOPIN ) 1 MG tablet   [facility] [address] [place], [place], [place] Phone: [phone]

## 2024-03-26 ENCOUNTER — Ambulatory Visit: Admitting: Physician Assistant

## 2024-03-26 ENCOUNTER — Encounter: Admitting: Pulmonary Disease

## 2024-04-06 ENCOUNTER — Ambulatory Visit: Admitting: Physician Assistant

## 2024-07-07 ENCOUNTER — Ambulatory Visit: Admitting: Adult Health
